# Patient Record
Sex: Female | Born: 1955 | Hispanic: No | Marital: Married | Smoking: Never smoker
Health system: Southern US, Community
[De-identification: ages and names within clinical notes are randomized; demographics above are authoritative.]

## PROBLEM LIST (undated history)

## (undated) DIAGNOSIS — E782 Mixed hyperlipidemia: Secondary | ICD-10-CM

## (undated) DIAGNOSIS — T148XXA Other injury of unspecified body region, initial encounter: Secondary | ICD-10-CM

## (undated) DIAGNOSIS — L80 Vitiligo: Secondary | ICD-10-CM

## (undated) DIAGNOSIS — R011 Cardiac murmur, unspecified: Secondary | ICD-10-CM

## (undated) DIAGNOSIS — E079 Disorder of thyroid, unspecified: Secondary | ICD-10-CM

## (undated) DIAGNOSIS — R7302 Impaired glucose tolerance (oral): Secondary | ICD-10-CM

## (undated) DIAGNOSIS — F419 Anxiety disorder, unspecified: Secondary | ICD-10-CM

## (undated) DIAGNOSIS — E039 Hypothyroidism, unspecified: Secondary | ICD-10-CM

## (undated) DIAGNOSIS — G4733 Obstructive sleep apnea (adult) (pediatric): Secondary | ICD-10-CM

## (undated) HISTORY — DX: Cardiac murmur, unspecified: R01.1

## (undated) HISTORY — DX: Anxiety disorder, unspecified: F41.9

## (undated) HISTORY — DX: Vitiligo: L80

## (undated) HISTORY — DX: Impaired glucose tolerance (oral): R73.02

## (undated) HISTORY — DX: Hypothyroidism, unspecified: E03.9

## (undated) HISTORY — DX: Mixed hyperlipidemia: E78.2

## (undated) HISTORY — PX: BREAST BIOPSY: SHX20

## (undated) HISTORY — PX: ABDOMINAL SURGERY: SHX537

## (undated) HISTORY — DX: Obstructive sleep apnea (adult) (pediatric): G47.33

## (undated) HISTORY — DX: Other injury of unspecified body region, initial encounter: T14.8XXA

---

## 2004-08-15 ENCOUNTER — Ambulatory Visit: Payer: Self-pay | Admitting: Unknown Physician Specialty

## 2005-09-01 ENCOUNTER — Ambulatory Visit: Payer: Self-pay | Admitting: Unknown Physician Specialty

## 2006-10-10 ENCOUNTER — Ambulatory Visit: Payer: Self-pay | Admitting: Unknown Physician Specialty

## 2007-10-31 ENCOUNTER — Ambulatory Visit: Payer: Self-pay | Admitting: Unknown Physician Specialty

## 2008-03-04 ENCOUNTER — Ambulatory Visit: Payer: Self-pay | Admitting: Unknown Physician Specialty

## 2008-03-31 ENCOUNTER — Ambulatory Visit: Payer: Self-pay | Admitting: Internal Medicine

## 2008-04-08 ENCOUNTER — Ambulatory Visit: Payer: Self-pay | Admitting: Internal Medicine

## 2008-11-11 ENCOUNTER — Ambulatory Visit: Payer: Self-pay | Admitting: Unknown Physician Specialty

## 2009-04-05 ENCOUNTER — Ambulatory Visit: Payer: Self-pay | Admitting: Pain Medicine

## 2009-04-14 ENCOUNTER — Ambulatory Visit: Payer: Self-pay | Admitting: Pain Medicine

## 2009-05-06 ENCOUNTER — Ambulatory Visit: Payer: Self-pay | Admitting: Pain Medicine

## 2009-05-12 ENCOUNTER — Ambulatory Visit: Payer: Self-pay | Admitting: Pain Medicine

## 2009-06-10 ENCOUNTER — Ambulatory Visit: Payer: Self-pay | Admitting: Pain Medicine

## 2009-07-06 ENCOUNTER — Ambulatory Visit: Payer: Self-pay | Admitting: Pain Medicine

## 2009-09-21 ENCOUNTER — Ambulatory Visit: Payer: Self-pay | Admitting: Pain Medicine

## 2009-11-09 ENCOUNTER — Ambulatory Visit: Payer: Self-pay | Admitting: Pain Medicine

## 2009-11-10 ENCOUNTER — Ambulatory Visit: Payer: Self-pay | Admitting: Pain Medicine

## 2009-11-23 ENCOUNTER — Encounter: Admission: RE | Admit: 2009-11-23 | Discharge: 2009-11-23 | Payer: Self-pay | Admitting: Internal Medicine

## 2009-12-16 ENCOUNTER — Ambulatory Visit: Payer: Self-pay | Admitting: Pain Medicine

## 2010-01-18 ENCOUNTER — Ambulatory Visit: Payer: Self-pay | Admitting: Pain Medicine

## 2010-03-17 ENCOUNTER — Ambulatory Visit: Payer: Self-pay | Admitting: Pain Medicine

## 2010-03-17 ENCOUNTER — Ambulatory Visit: Payer: Self-pay | Admitting: Unknown Physician Specialty

## 2010-05-20 ENCOUNTER — Ambulatory Visit: Payer: Self-pay | Admitting: Unknown Physician Specialty

## 2010-06-15 ENCOUNTER — Ambulatory Visit: Payer: Self-pay | Admitting: Pain Medicine

## 2010-09-01 ENCOUNTER — Ambulatory Visit: Payer: Self-pay | Admitting: Pain Medicine

## 2010-10-04 ENCOUNTER — Ambulatory Visit: Payer: Self-pay | Admitting: Pain Medicine

## 2010-12-08 ENCOUNTER — Ambulatory Visit: Payer: Self-pay | Admitting: Pain Medicine

## 2011-03-02 ENCOUNTER — Ambulatory Visit: Payer: Self-pay | Admitting: Pain Medicine

## 2011-05-25 ENCOUNTER — Ambulatory Visit: Payer: Self-pay | Admitting: Pain Medicine

## 2011-06-07 ENCOUNTER — Ambulatory Visit: Payer: Self-pay | Admitting: Pain Medicine

## 2011-07-11 ENCOUNTER — Ambulatory Visit: Payer: Self-pay | Admitting: Pain Medicine

## 2011-07-12 ENCOUNTER — Ambulatory Visit: Payer: Self-pay | Admitting: Pain Medicine

## 2011-08-17 ENCOUNTER — Ambulatory Visit: Payer: Self-pay | Admitting: Pain Medicine

## 2011-12-16 ENCOUNTER — Encounter (HOSPITAL_COMMUNITY): Payer: Self-pay | Admitting: Emergency Medicine

## 2011-12-16 ENCOUNTER — Emergency Department (INDEPENDENT_AMBULATORY_CARE_PROVIDER_SITE_OTHER)

## 2011-12-16 ENCOUNTER — Emergency Department (INDEPENDENT_AMBULATORY_CARE_PROVIDER_SITE_OTHER)
Admission: EM | Admit: 2011-12-16 | Discharge: 2011-12-16 | Disposition: A | Discharge status: Home / Self Care | Source: Home / Self Care | Attending: Emergency Medicine | Admitting: Emergency Medicine

## 2011-12-16 DIAGNOSIS — IMO0002 Reserved for concepts with insufficient information to code with codable children: Secondary | ICD-10-CM

## 2011-12-16 DIAGNOSIS — S93409A Sprain of unspecified ligament of unspecified ankle, initial encounter: Secondary | ICD-10-CM

## 2011-12-16 DIAGNOSIS — M549 Dorsalgia, unspecified: Secondary | ICD-10-CM

## 2011-12-16 DIAGNOSIS — S73109A Unspecified sprain of unspecified hip, initial encounter: Secondary | ICD-10-CM

## 2011-12-16 HISTORY — DX: Disorder of thyroid, unspecified: E07.9

## 2011-12-16 MED ORDER — HYDROCODONE-IBUPROFEN 7.5-200 MG PO TABS: 1.0000 | ORAL_TABLET | Freq: Three times a day (TID) | ORAL | Status: AC | PRN

## 2011-12-16 NOTE — ED Provider Notes (Signed)
History     CSN: [id number]  Arrival date & time 12/16/11  1623   First MD Initiated Contact with Patient 12/16/11 1639      Chief Complaint  Patient presents with  . Ankle Injury    (Consider location/radiation/quality/duration/timing/severity/associated sxs/prior treatment) HPI Comments: Patient presents urgent care Pt c/o left ankle inj today around 15:05... 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 States she twisted her ankle... Sx include: tender, swelling, limping... she describes it sometimes ago she had a left ankle injury as well and is wondering, if she reinjured her ankle. During the fall she fell on her back and is also experiencing lower back pain. Patient denies any numbness, tingling sensation weakness or lower extremities. It hurts when she walks on her left ankle and also on her back.  Patient is a 56 y.o. female presenting with lower extremity injury. The history is provided by the patient.  Ankle Injury This is a new problem. The problem occurs constantly. The problem has not changed since onset.The symptoms are aggravated by bending and walking. She has tried nothing for the symptoms. The treatment provided no relief.    Past Medical History  Diagnosis Date  . Thyroid disease     History reviewed. No pertinent past surgical history.  No family history on file.  History  Substance Use Topics  . Smoking status: Never Smoker   . Smokeless tobacco: Not on file  . Alcohol Use: Yes    OB History    Grav Para Term Preterm Abortions TAB SAB Ect Mult Living                  Review of Systems  Constitutional: Positive for activity change. Negative for fever and fatigue.  Musculoskeletal: Positive for back pain and joint swelling. Negative for myalgias.  Neurological: Negative for weakness and numbness.    Allergies  Review of patient's allergies indicates no known  allergies.  Home Medications   Current Outpatient Rx  Name Route Sig Dispense Refill  . LEVOTHYROXINE SODIUM 88 MCG PO TABS Oral Take 88 mcg by mouth daily.    Marland Kitchen HYDROCODONE-IBUPROFEN 7.5-200 MG PO TABS Oral Take 1 tablet by mouth every 8 (eight) hours as needed for pain. 15 tablet 0    BP 147/85  Pulse 75  Temp 98.4 F (36.9 C) (Oral)  Resp 17  SpO2 98%  Physical Exam  Nursing note and vitals reviewed. Constitutional: She appears well-developed and well-nourished.  Musculoskeletal: She exhibits tenderness.       Left ankle: She exhibits normal range of motion, no swelling, no ecchymosis, no laceration and normal pulse. tenderness. Lateral malleolus tenderness found. No posterior TFL, no head of 5th metatarsal and no proximal fibula tenderness found. Achilles tendon normal.       Back:       Feet:  Neurological: She is alert.  Skin: No rash noted. No erythema. No pallor.    ED Course  Procedures (including critical care time)  Labs Reviewed - No data to display Dg Ankle Complete Left  12/16/2011  *RADIOLOGY REPORT*  Clinical Data: 56 year old female with left ankle pain following injury.  LEFT ANKLE COMPLETE - 3+ VIEW  Comparison: 11/23/2009  Findings: There is no evidence of acute fracture, subluxation or dislocation. The ankle mortise is intact. No focal bony lesions are identified. There is no evidence of soft tissue swelling.  IMPRESSION: No evidence of  acute bony abnormality.   Original Report Authenticated By: Rosendo Gros, M.D.      1. Ankle sprain and strain   2. Sprain hip/thigh   3. Back pain       MDM  Full with an associated left ankle sprain, back contusion. Have prescribed patient a course of Vicoprofen for 5 days encourage her to followup with occupational health if pain with weightbearing activities will be on Monday for further job restrictions and stipulations.      Jimmie Molly, MD 12/16/11 1902

## 2011-12-16 NOTE — ED Notes (Signed)
Pt c/o left ankle inj today around 15:05... 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 States she twisted her ankle... Sx include: tender, swelling, limping... Denies: head inj and loss of conscious... Pt is alert w/no signs of distress.

## 2012-07-25 ENCOUNTER — Other Ambulatory Visit: Payer: Self-pay | Admitting: Pain Medicine

## 2012-07-25 ENCOUNTER — Ambulatory Visit: Payer: Self-pay | Admitting: Pain Medicine

## 2012-08-21 ENCOUNTER — Ambulatory Visit: Payer: Self-pay | Admitting: Pain Medicine

## 2012-09-18 ENCOUNTER — Ambulatory Visit: Payer: Self-pay | Admitting: Pain Medicine

## 2012-10-17 ENCOUNTER — Ambulatory Visit: Payer: Self-pay | Admitting: Pain Medicine

## 2012-12-05 ENCOUNTER — Ambulatory Visit: Payer: Self-pay | Admitting: Pain Medicine

## 2012-12-09 ENCOUNTER — Ambulatory Visit: Payer: Self-pay | Admitting: Pain Medicine

## 2013-01-07 ENCOUNTER — Ambulatory Visit: Payer: Self-pay | Admitting: Pain Medicine

## 2013-02-06 ENCOUNTER — Ambulatory Visit: Payer: Self-pay | Admitting: Pain Medicine

## 2013-03-13 ENCOUNTER — Ambulatory Visit: Payer: Self-pay | Admitting: Pain Medicine

## 2013-04-15 ENCOUNTER — Ambulatory Visit: Payer: Self-pay | Admitting: Pain Medicine

## 2013-05-13 ENCOUNTER — Ambulatory Visit: Payer: Self-pay | Admitting: Pain Medicine

## 2013-06-12 ENCOUNTER — Ambulatory Visit: Payer: Self-pay | Admitting: Pain Medicine

## 2013-07-10 ENCOUNTER — Ambulatory Visit: Payer: Self-pay | Admitting: Pain Medicine

## 2013-08-07 ENCOUNTER — Ambulatory Visit: Payer: Self-pay | Admitting: Pain Medicine

## 2013-09-09 ENCOUNTER — Ambulatory Visit: Payer: Self-pay | Admitting: Pain Medicine

## 2013-10-09 ENCOUNTER — Ambulatory Visit: Payer: Self-pay | Admitting: Pain Medicine

## 2013-11-18 ENCOUNTER — Ambulatory Visit: Payer: Self-pay | Admitting: Pain Medicine

## 2013-12-16 ENCOUNTER — Ambulatory Visit: Payer: Self-pay | Admitting: Pain Medicine

## 2014-01-13 ENCOUNTER — Ambulatory Visit: Payer: Self-pay | Admitting: Pain Medicine

## 2014-02-03 ENCOUNTER — Emergency Department: Payer: Self-pay | Admitting: Emergency Medicine

## 2014-02-17 ENCOUNTER — Ambulatory Visit: Payer: Self-pay | Admitting: Pain Medicine

## 2014-03-19 ENCOUNTER — Ambulatory Visit: Payer: Self-pay | Admitting: Pain Medicine

## 2014-04-16 ENCOUNTER — Ambulatory Visit: Payer: Self-pay | Admitting: Pain Medicine

## 2014-05-12 ENCOUNTER — Ambulatory Visit: Payer: Self-pay | Admitting: Pain Medicine

## 2014-06-16 ENCOUNTER — Ambulatory Visit
Admit: 2014-06-16 | Disposition: A | Discharge status: Home / Self Care | Payer: Self-pay | Attending: Pain Medicine | Admitting: Pain Medicine

## 2014-07-16 ENCOUNTER — Encounter: Payer: Self-pay | Admitting: Pain Medicine

## 2014-07-16 ENCOUNTER — Encounter (INDEPENDENT_AMBULATORY_CARE_PROVIDER_SITE_OTHER): Payer: Self-pay

## 2014-07-16 ENCOUNTER — Ambulatory Visit: Payer: 59 | Attending: Pain Medicine | Admitting: Pain Medicine

## 2014-07-16 VITALS — BP 136/91 | HR 63 | Temp 98.3°F | Resp 16 | Ht 63.0 in | Wt 175.0 lb

## 2014-07-16 DIAGNOSIS — M47816 Spondylosis without myelopathy or radiculopathy, lumbar region: Secondary | ICD-10-CM | POA: Diagnosis not present

## 2014-07-16 DIAGNOSIS — M542 Cervicalgia: Secondary | ICD-10-CM | POA: Diagnosis present

## 2014-07-16 DIAGNOSIS — S82891A Other fracture of right lower leg, initial encounter for closed fracture: Secondary | ICD-10-CM | POA: Insufficient documentation

## 2014-07-16 DIAGNOSIS — M5136 Other intervertebral disc degeneration, lumbar region: Secondary | ICD-10-CM | POA: Diagnosis not present

## 2014-07-16 DIAGNOSIS — M546 Pain in thoracic spine: Secondary | ICD-10-CM | POA: Diagnosis present

## 2014-07-16 DIAGNOSIS — G5600 Carpal tunnel syndrome, unspecified upper limb: Secondary | ICD-10-CM | POA: Insufficient documentation

## 2014-07-16 DIAGNOSIS — M533 Sacrococcygeal disorders, not elsewhere classified: Secondary | ICD-10-CM | POA: Diagnosis not present

## 2014-07-16 DIAGNOSIS — M7700 Medial epicondylitis, unspecified elbow: Secondary | ICD-10-CM | POA: Diagnosis not present

## 2014-07-16 DIAGNOSIS — G8929 Other chronic pain: Secondary | ICD-10-CM | POA: Insufficient documentation

## 2014-07-16 DIAGNOSIS — M51369 Other intervertebral disc degeneration, lumbar region without mention of lumbar back pain or lower extremity pain: Secondary | ICD-10-CM | POA: Insufficient documentation

## 2014-07-16 DIAGNOSIS — M5481 Occipital neuralgia: Secondary | ICD-10-CM | POA: Insufficient documentation

## 2014-07-16 DIAGNOSIS — G5603 Carpal tunnel syndrome, bilateral upper limbs: Secondary | ICD-10-CM

## 2014-07-16 MED ORDER — OXYCODONE HCL 10 MG PO TABS: ORAL_TABLET | ORAL | Status: DC

## 2014-07-16 NOTE — Progress Notes (Signed)
Subjective:    Patient ID: Shannon Crawford, female    DOB: 1955/11/14, 59 y.o.   MRN: [id number]  HPI  The  patient is 59 year old female who [facility] and treatment of pain involving the neck and entire back upper and lower extremity regions. The patient states that she has had some difficulty sleeping. We will continue medications and instruct patient to try taking one half of a Zanaflex with her Ambien at bedtime at this time and we will avoid interventional treatment. Patient will continue to exercise and attempt to reduce weight and will remain available to consider interventional treatment pending  Spinous to present treatment regimen. All understanding and in agreement with suggested treatment plan    Review of Systems     Objective:   Physical Exam  there was tenderness of the splenius capitate and occipitalis regions of mild degree. Palpation of the acromioclavicular and glenohumeral joint regions reproduced mild discomfort. There was unremarkable Spurling's maneuver. Tinel and Phalen's maneuver associated with mild discomfort.. There was tenderness to palpation of the epicondyles of the elbow medial and lateral aspects of the elbow. There was tenderness to palpationof the cervical and thoracic facet regions reproduced mild discomfort.No  crepitus of the thoracic region was noted patient over the lumbar paraspinal musculature region lumbar facet region was associated tends to palpation of moderate degree. Lateral bending and rotation reproduce moderate discomfort. Leg raising was tolerated to 30 without increase of pain with dorsiflexion noted. No sensory deficit of dermatomal distribution of the lower extremities noted. There was mild to moderate tenderness of the greater trochanteric region and iliotibial band region. There was decreased dorsiflexion of the with negative clonus and negative Homans. The abdomen was nontender with no  costovertebral angle tenderness noted.          Assessment & Plan:     degenerative disc disease lumbar spine  Prior MRI has revealed L4 vertebral body level degenerative changes with abnormal signal in the anterior aspect of the lower portion of the L1 vertebral body. Prior studies for follow-up evaluation of the abnormality have been without significant abnormalities to suspect underlying process causing this to appear on  MRI   Lumbar facet syndrome   Sacroiliac joint dysfunction   Epicondylitis of the elbow   Carpal tunnel syndrome     plan   Continue present medications. Patient will try adding one half of a Zanaflex  to 5 mg of Ambien at bedtime as needed for insomnia  F/U PCP for evaliation of  BP and general medical  condition.  F/U surgical evaluation.  F/U neurological evaluation.  May consider radiofrequency rhizolysis or intraspinal procedures pending response to present treatment and F/U evaluation.  [facility] to [facility] should patient have concerns prior to scheduled return appointment.

## 2014-07-16 NOTE — Progress Notes (Signed)
Safety precautions to be maintained throughout the outpatient stay will include: orient to surroundings, keep bed in low position, maintain call bell within reach at all times, provide assistance with transfer out of bed and ambulation.  

## 2014-07-16 NOTE — Progress Notes (Signed)
Discharged to home ambulatory with script for oxycodone at 1224

## 2014-07-16 NOTE — Patient Instructions (Addendum)
Continue present medications. As discussed you may try taken one half of a Zanaflex with one half of an Ambien to obtain better and more restful sleep. You may also try taken one half of a Zanaflex during the day however remembers Zanaflex can cause excessive drowsiness confusion and other undesirable side effects. You may take one half to one Mobic per day if tolerated as well and continue oxycodone as discussed   F/U PCP for evaliation of  BP and general medical  condition.  F/U surgical evaluation.  F/U neurological evaluation.  May consider radiofrequency rhizolysis or intraspinal procedures pending response to present treatment and F/U evaluation.  [facility] to call [facility] should patient have concerns prior to scheduled return appointment.

## 2014-08-12 ENCOUNTER — Encounter: Payer: Self-pay | Admitting: Pain Medicine

## 2014-08-12 ENCOUNTER — Ambulatory Visit: Payer: 59 | Attending: Pain Medicine | Admitting: Pain Medicine

## 2014-08-12 VITALS — BP 116/50 | HR 66 | Temp 98.4°F | Resp 15 | Ht 63.0 in | Wt 175.0 lb

## 2014-08-12 DIAGNOSIS — M47816 Spondylosis without myelopathy or radiculopathy, lumbar region: Secondary | ICD-10-CM

## 2014-08-12 DIAGNOSIS — S82891A Other fracture of right lower leg, initial encounter for closed fracture: Secondary | ICD-10-CM

## 2014-08-12 DIAGNOSIS — M533 Sacrococcygeal disorders, not elsewhere classified: Secondary | ICD-10-CM | POA: Diagnosis not present

## 2014-08-12 DIAGNOSIS — M51369 Other intervertebral disc degeneration, lumbar region without mention of lumbar back pain or lower extremity pain: Secondary | ICD-10-CM

## 2014-08-12 DIAGNOSIS — G5600 Carpal tunnel syndrome, unspecified upper limb: Secondary | ICD-10-CM | POA: Diagnosis not present

## 2014-08-12 DIAGNOSIS — M5136 Other intervertebral disc degeneration, lumbar region: Secondary | ICD-10-CM | POA: Insufficient documentation

## 2014-08-12 DIAGNOSIS — M79605 Pain in left leg: Secondary | ICD-10-CM | POA: Diagnosis present

## 2014-08-12 DIAGNOSIS — M545 Low back pain: Secondary | ICD-10-CM | POA: Diagnosis present

## 2014-08-12 DIAGNOSIS — M79604 Pain in right leg: Secondary | ICD-10-CM | POA: Diagnosis present

## 2014-08-12 DIAGNOSIS — M5481 Occipital neuralgia: Secondary | ICD-10-CM

## 2014-08-12 MED ORDER — OXYCODONE HCL 10 MG PO TABS: ORAL_TABLET | ORAL | Status: DC

## 2014-08-12 NOTE — Progress Notes (Signed)
Discharged to home via ambulatory with script in hand for oxycodone.

## 2014-08-12 NOTE — Progress Notes (Signed)
Safety precautions to be maintained throughout the outpatient stay will include: orient to surroundings, keep bed in low position, maintain call bell within reach at all times, provide assistance with transfer out of bed and ambulation.  

## 2014-08-12 NOTE — Patient Instructions (Addendum)
Continue present medications and oxycodone  F/U PCP for evaliation of  BP and general medical  condition.  F/U surgical evaluation.  F/U neurological evaluation.  May consider radiofrequency rhizolysis or intraspinal procedures pending response to present treatment and F/U evaluation.  Patient to call [facility] should patient have concerns prior to scheduled return appointment.

## 2014-08-12 NOTE — Progress Notes (Signed)
Subjective:    Patient ID: Shannon Crawford, female    DOB: May 11, 1955, 59 y.o.   MRN: [id number]  HPI  The patient is a 59 year old female returns [facility] for further evaluation and treatment of pain involving the lower back and lower extremity region predominantly. Patient with history of pain involving the upper extremities with component of carpal tunnel syndrome and pain of the cervical and thoracic regions as well. Patient admits to some upper back pain beneath the scapula with patient described as a spasm which occurs on the right side predominantly beneath the scapula winging. Patient without trauma change in events of daily living the call significant change in symptomatology. We discussed interventional treatment, massage, and other treatment at the end of the present time we will continue present medications. The patient will continue to exercise and avoid activities which appear to aggravate condition and will call pain management should there be significant change in condition. We will continue present medications and consider modification of treatment regimen pending response to treatment and follow-up evaluation. Patient was understanding and agree with suggested treatment plan      Review of Systems     Objective:   Physical Exam  There was tenderness of the splenius capitis and occipitalis musculature region of mild degree. There was mild tenderness of the acromioclavicular and glenohumeral joint regions. Patient appeared to be with bilaterally equal grip strength. Tinel and Phalen's maneuver associated with mild discomfort. Palpation of the thoracic facet thoracic paraspinal musculature region was a tends to palpation on the right with severe tenderness over the region of the scapula and rhomboid musculature regions on the right. There was tenderness over the lower thoracic paraspinal musculature region as well and no crepitus of the thoracic region was noted.  Palpation over the lumbar paraspinal musculature region lumbar facet region was with mild tenderness to palpation. There was tenderness over the PSIS and PII S regions of mild to moderate degree. Straight leg raising tolerates approximately 30 with no increased pain with dorsiflexion noted.. There was decreased EHL strength of the right ankle compared to the left ankle status post fracture of ankle There was negative clonus negative Homans. Abdomen nontender and no costovertebral angle tenderness noted.      Assessment & Plan:  Degenerative disc disease lumbar spine Degenerative changes L4-L5 level most involved with the lower pole of the L4 vertebral body level of abnormality noted in the L4 vertebral body. Patient is status post further evaluation of abnormality of the L1 vertebral body without evidence of significant explained the abnormality. There is no evidence of malignancy  Lumbar facet syndrome  Sacroiliac joint dysfunction.  Status post fracture lower extremity(ankle)  Carpal tunnel syndrome    Plan   Continue present medications. Oxycodone and other medications  F/U PCP for evaliation of  BP and general medical  condition.  F/U surgical evaluation.  F/U neurological evaluation.  Patient was advised to massage the area of the thoracic region on stretching and other exercises with caution to avoid aggravation of symptomatology   May consider radiofrequency rhizolysis or intraspinal procedures pending response to present treatment and F/U evaluation.  [facility] to call [facility] should patient have concerns prior to scheduled return appointment.

## 2014-08-28 ENCOUNTER — Other Ambulatory Visit: Payer: Self-pay | Admitting: Pain Medicine

## 2014-09-09 ENCOUNTER — Ambulatory Visit: Payer: 59 | Attending: Pain Medicine | Admitting: Pain Medicine

## 2014-09-09 ENCOUNTER — Encounter: Payer: Self-pay | Admitting: Pain Medicine

## 2014-09-09 VITALS — BP 125/67 | HR 61 | Temp 98.2°F | Resp 15 | Ht 64.0 in | Wt 180.0 lb

## 2014-09-09 DIAGNOSIS — M47816 Spondylosis without myelopathy or radiculopathy, lumbar region: Secondary | ICD-10-CM

## 2014-09-09 DIAGNOSIS — M7060 Trochanteric bursitis, unspecified hip: Secondary | ICD-10-CM | POA: Insufficient documentation

## 2014-09-09 DIAGNOSIS — M545 Low back pain: Secondary | ICD-10-CM | POA: Diagnosis present

## 2014-09-09 DIAGNOSIS — Z8781 Personal history of (healed) traumatic fracture: Secondary | ICD-10-CM | POA: Diagnosis not present

## 2014-09-09 DIAGNOSIS — M5136 Other intervertebral disc degeneration, lumbar region: Secondary | ICD-10-CM | POA: Insufficient documentation

## 2014-09-09 DIAGNOSIS — M533 Sacrococcygeal disorders, not elsewhere classified: Secondary | ICD-10-CM | POA: Diagnosis not present

## 2014-09-09 DIAGNOSIS — G5600 Carpal tunnel syndrome, unspecified upper limb: Secondary | ICD-10-CM

## 2014-09-09 DIAGNOSIS — S82891A Other fracture of right lower leg, initial encounter for closed fracture: Secondary | ICD-10-CM

## 2014-09-09 DIAGNOSIS — M5481 Occipital neuralgia: Secondary | ICD-10-CM

## 2014-09-09 DIAGNOSIS — M79604 Pain in right leg: Secondary | ICD-10-CM | POA: Diagnosis present

## 2014-09-09 DIAGNOSIS — M79605 Pain in left leg: Secondary | ICD-10-CM | POA: Diagnosis present

## 2014-09-09 MED ORDER — OXYCODONE HCL 10 MG PO TABS: ORAL_TABLET | ORAL | Status: DC

## 2014-09-09 NOTE — Progress Notes (Signed)
Subjective:    Patient ID: Shannon Crawford, female    DOB: Jun 08, 1955, 59 y.o.   MRN: [id number]  HPI  Patient is 59 year old female [facility] for further evaluation and treatment of pain involving the lower back lower extremity regions with pain of the upper extremity regions as well. Patient states that she is exercising again and that she appears to be with pain fairly well-controlled at this time. Patient states that her carpal tunnel symptoms appear to be fairly well-controlled and that the lower back lower extremity pain has improved with prior treatment and [facility] as well as continuing exercise program. Patient also admits to sleeping better. We will continue present medications at this time and avoid interventional treatment the patient was understanding and in agreement with suggested treatment plan.     Review of Systems     Objective:   Physical Exam   There was tenderness to palpation over the splenius capitis and occipitalis musculature region of mild degree. To be unremarkable Spurling's maneuver. Palpation over the cervical facet cervical paraspinal musculature region and thoracic facet thoracic paraspinal musculature region reproduced mild discomfort. Tinel and Phalen's maneuver associated with mild discomfort. Patient appeared to be with slightly decreased grip strength. Palpation over the lower thoracic paraspinal musculature region was with evidence of mild muscle spasms. Palpation over the lumbar paraspinal muscles region lumbar facet region associated with mild discomfort. Lateral bending rotation and extension and palpation of the lumbar facets reproduce mild discomfort. Palpation of the greater trochanteric region iliotibial band region was without increased pain of significant degree. There was moderate tends to palpation of the greater trochanteric region on the left as well as on the right. There was slight increase of pain with  pressure prior to the ilium with patient in lateral decubitus position. Patrick's maneuver was unremarkable. Straight leg raising was without increase of pain with dorsiflexion and tolerates approximately 30. There was negative clonus negative Homans. Abdomen nontender with no costovertebral angle tenderness noted.       Assessment & Plan:  Degenerative disc disease lumbar spine Lumbar facet syndrome  Sacroiliac joint dysfunction  Carpal tunnel syndrome  Greater trochanteric bursitis  Status post lower extremity fracture ( ankle )     Plan  Continue present medications Zanaflex  Ambien oxycodone and Lidoderm patches  F/U PCP Dr. Valrie Hart  for evaliation of  BP and general medical  condition.  F/U surgical evaluation  F/U neurological evaluation  F/U Dr Garnetta Buddy   May consider radiofrequency rhizolysis or intraspinal procedures pending response to present treatment and F/U evaluation.  [facility] to call [facility] should patient have concerns prior to scheduled return appointment.

## 2014-09-09 NOTE — Progress Notes (Signed)
Safety precautions to be maintained throughout the outpatient stay will include: orient to surroundings, keep bed in low position, maintain call bell within reach at all times, provide assistance with transfer out of bed and ambulation.  

## 2014-09-09 NOTE — Patient Instructions (Addendum)
Continue present medications Zanaflex, oxycodone, Lidoderm patches and other medications . As discussed we may consider Flector patch and Lidoderm patch is not effective  F/U PCPDr.N Humphrey Rolls for evaliation of  BP and general medical  condition.  F/U surgical evaluation  F/U Dr  Gretel Acre  F/U neurological evaluation  May consider radiofrequency rhizolysis or intraspinal procedures pending response to present treatment and F/U evaluation.  Patient to call [facility] should patient have concerns prior to scheduled return appointment.

## 2014-09-10 ENCOUNTER — Encounter: Payer: Self-pay | Admitting: Pain Medicine

## 2014-10-01 ENCOUNTER — Encounter: Payer: Self-pay | Admitting: Psychiatry

## 2014-10-01 ENCOUNTER — Ambulatory Visit (INDEPENDENT_AMBULATORY_CARE_PROVIDER_SITE_OTHER): Payer: 59 | Admitting: Psychiatry

## 2014-10-01 DIAGNOSIS — F331 Major depressive disorder, recurrent, moderate: Secondary | ICD-10-CM | POA: Diagnosis not present

## 2014-10-01 MED ORDER — DULOXETINE HCL 60 MG PO CPEP: 60.0000 mg | ORAL_CAPSULE | Freq: Every day | ORAL | Status: DC

## 2014-10-01 MED ORDER — ZOLPIDEM TARTRATE 5 MG PO TABS: 5.0000 mg | ORAL_TABLET | Freq: Every evening | ORAL | Status: DC | PRN

## 2014-10-01 MED ORDER — ALPRAZOLAM 0.5 MG PO TABS: 0.5000 mg | ORAL_TABLET | Freq: Every day | ORAL | Status: DC | PRN

## 2014-10-01 NOTE — Progress Notes (Signed)
[facility] MD/PA/NP OP Progress Note  10/01/2014 1:14 PM Shannon Crawford  MRN:  [id number]  Subjective:   Pt  is a 59 year old female who presented for the follow-up appointment. She reported that she has been trying to learn how to knit on her own. She brought some things to show to me. She's been working hard on the same. Patient reported that she is currently feeling stable on her current medications. She currently denied having any suicidal ideations or plans. She sleeps well with the help of zolpidem. She takes Xanax on a when necessary basis. She reported that she has good relationship with her husband. She denied having any mood symptoms. She does not smoke or use any drugs or alcohol at this time. She appeared pleasant and cooperative. Chief Complaint:  Chief Complaint    Follow-up; Depression; Anxiety     Visit Diagnosis:     ICD-9-CM ICD-10-CM   1. MDD (major depressive disorder), recurrent episode, moderate 296.32 F33.1     Past Medical History:  Past Medical History  Diagnosis Date  . Thyroid disease   . Fracture     ankle  . Heart murmur   . Hypothyroid   . Anxiety     Past Surgical History  Procedure Laterality Date  . Abdominal surgery      tummy tuck   Family History:  Family History  Problem Relation Age of Onset  . Hypertension Mother   . Alcohol abuse Father    Social History:  Social History   Social History  . Marital Status: Married    Spouse Name: N/A  . Number of Children: N/A  . Years of Education: N/A   Social History Main Topics  . Smoking status: Never Smoker   . Smokeless tobacco: None  . Alcohol Use: Yes  . Drug Use: No  . Sexual Activity: Yes   Other Topics Concern  . None   Social History Narrative   Additional History: Married and lives with husband.   Assessment:   Musculoskeletal: Strength & Muscle Tone: within normal limits Gait & Station: normal Patient leans: N/A  Psychiatric Specialty Exam: HPI  Review of Systems   Musculoskeletal: Positive for back pain.  Psychiatric/Behavioral: Positive for depression. The patient is nervous/anxious.   All other systems reviewed and are negative.   There were no vitals taken for this visit.There is no weight on file to calculate BMI.  General Appearance: Casual  Eye Contact:  Fair  Speech:  Clear and Coherent and Normal Rate  Volume:  Normal  Mood:  Euthymic  Affect:  Congruent  Thought Process:  Goal Directed  Orientation:  Full (Time, Place, and Person)  Thought Content:  WDL  Suicidal Thoughts:  No  Homicidal Thoughts:  No  Memory:  Immediate;   Fair  Judgement:  Fair  Insight:  Fair  Psychomotor Activity:  Normal  Concentration:  Fair  Recall:  [employer] of Knowledge: Fair  Language: Fair  Akathisia:  No  Handed:  Right  AIMS (if indicated):    Assets:  Communication Skills Desire for Improvement Physical Health Social Support  ADL's:  Intact  Cognition: WNL  Sleep:     Is the patient at risk to self?  No. Has the patient been a risk to self in the past 6 months?  No. Has the patient been a risk to self within the distant past?  No. Is the patient a risk to others?  No. Has the patient been a  risk to others in the past 6 months?  No. Has the patient been a risk to others within the distant past?  No.  Current Medications: Current Outpatient Prescriptions  Medication Sig Dispense Refill  . ALPRAZolam (XANAX) 0.5 MG tablet Take 0.5 mg by mouth daily as needed for anxiety.    . DULoxetine (CYMBALTA) 30 MG capsule Take 30 mg by mouth daily.    Marland Kitchen levothyroxine (SYNTHROID, LEVOTHROID) 88 MCG tablet Take 88 mcg by mouth daily.    Marland Kitchen lidocaine (LIDODERM) 5 % Place 3 patches onto the skin as needed. 1-3 patches to painful areas. Remove & Discard patch within 12 hours or as directed by MD    . meloxicam (MOBIC) 7.5 MG tablet Take 7.5 mg by mouth as needed.     . Oxycodone HCl 10 MG TABS Limit 1/2-1 tablet bid-qid if tolerated 120 tablet 0  .  Pitavastatin Calcium 2 MG TABS Take 2 mg by mouth daily.    Marland Kitchen zolpidem (AMBIEN) 5 MG tablet Take 5 mg by mouth at bedtime as needed for sleep.    . DULoxetine (CYMBALTA) 60 MG capsule     . orphenadrine (NORFLEX) 100 MG tablet Take 100 mg by mouth as needed. 1 tab daily or bid if tolerated    . tizanidine (ZANAFLEX) 2 MG capsule Take 2 mg by mouth as needed. 1-2 at bedtime     No current facility-administered medications for this visit.    Medical Decision Making:  Review of Psycho-Social Stressors (1) and Review and summation of old records (2)  Treatment Plan Summary:Medication management   Continue Cymbalta 60 mg by mouth daily she also takes Xanax 0.5 mg and a when necessary basis. Patient sleeps well with the help of zolpidem 5 mg by mouth daily at bedtime medications refilled for the next 3 months. Follow up in 3 months.   More than 50% of the time spent in psychoeducation, counseling and coordination of care.    This note was generated in part or whole with voice recognition software. Voice regonition is usually quite accurate but there are transcription errors that can and very often do occur. I apologize for any typographical errors that were not detected and corrected.    Rainey Pines 10/01/2014, 1:14 PM

## 2014-10-13 ENCOUNTER — Encounter: Payer: Self-pay | Admitting: Pain Medicine

## 2014-10-13 ENCOUNTER — Ambulatory Visit: Payer: 59 | Attending: Pain Medicine | Admitting: Pain Medicine

## 2014-10-13 VITALS — BP 129/73 | HR 69 | Temp 98.2°F | Resp 18 | Ht 64.0 in | Wt 180.0 lb

## 2014-10-13 DIAGNOSIS — M7060 Trochanteric bursitis, unspecified hip: Secondary | ICD-10-CM | POA: Diagnosis not present

## 2014-10-13 DIAGNOSIS — M79601 Pain in right arm: Secondary | ICD-10-CM | POA: Diagnosis present

## 2014-10-13 DIAGNOSIS — G5600 Carpal tunnel syndrome, unspecified upper limb: Secondary | ICD-10-CM | POA: Insufficient documentation

## 2014-10-13 DIAGNOSIS — M5136 Other intervertebral disc degeneration, lumbar region: Secondary | ICD-10-CM | POA: Insufficient documentation

## 2014-10-13 DIAGNOSIS — M533 Sacrococcygeal disorders, not elsewhere classified: Secondary | ICD-10-CM | POA: Diagnosis not present

## 2014-10-13 DIAGNOSIS — M47816 Spondylosis without myelopathy or radiculopathy, lumbar region: Secondary | ICD-10-CM

## 2014-10-13 DIAGNOSIS — S82891A Other fracture of right lower leg, initial encounter for closed fracture: Secondary | ICD-10-CM

## 2014-10-13 DIAGNOSIS — M545 Low back pain: Secondary | ICD-10-CM | POA: Diagnosis present

## 2014-10-13 DIAGNOSIS — M79602 Pain in left arm: Secondary | ICD-10-CM | POA: Diagnosis present

## 2014-10-13 DIAGNOSIS — Z8781 Personal history of (healed) traumatic fracture: Secondary | ICD-10-CM | POA: Diagnosis not present

## 2014-10-13 DIAGNOSIS — M5481 Occipital neuralgia: Secondary | ICD-10-CM

## 2014-10-13 MED ORDER — OXYCODONE HCL 10 MG PO TABS: ORAL_TABLET | ORAL | Status: DC

## 2014-10-13 NOTE — Progress Notes (Signed)
Safety precautions to be maintained throughout the outpatient stay will include: orient to surroundings, keep bed in low position, maintain call bell within reach at all times, provide assistance with transfer out of bed and ambulation.  

## 2014-10-13 NOTE — Patient Instructions (Signed)
Continue present medication oxycodone  F/U PCP  N khan  for evaliation of  BP and general medical  condition  F/U surgical evaluation  F/U neurological evaluation  May consider radiofrequency rhizolysis or intraspinal procedures pending response to present treatment and F/U evaluation   [facility] to call [facility] should patient have concerns prior to scheduled return appointmen.

## 2014-10-13 NOTE — Progress Notes (Signed)
Subjective:    Patient ID: Shannon Crawford, female    DOB: 05-Feb-1956, 59 y.o.   MRN: [id number]  HPI  Patient is a 59 year old female who [facility] further evaluation and treatment of pain involving the upper extremities lower back and lower extremity regions. At the present time patient appears to be with pain fairly well-controlled. Patient is able to perform most activities of daily living without significant pain interferes with activities of daily living. We discussed patient's condition patient did admit to some increased pain involving the greater trochanteric region which is interfered with some ability to obtain restful sleep. We advised patient to use Lidoderm patch and to apply Voltaren gel to the hip. We will remain available to consider interventional treatment as well as additional modification of treatment should patient's symptoms increase or persist. The patient was understanding and in agreement status treatment plan We will continue present medications as prescribed at this time. The patient was in agreement with suggested treatment plan    Review of Systems     Objective:   Physical Exam  There was tenderness of the splenius capitis and occipitalis musculature region of mild degree. There was mild tennis over the cervical facet cervical paraspinal musculature region palpation of the acromioclavicular and glenohumeral joint region was with mild tends to palpation. There was mild to moderate increase of pain with Tinel and Phalen's maneuver. Patient was with question decreased grip strength. There was unremarkable Spurling's maneuver. Palpation over the thoracic facet thoracic paraspinal muscles region was with mild tends to palpation and no crepitus of the thoracic region was noted. Palpation over the lumbar paraspinal muscle lumbar facet region was associated increased pain of moderate degree lateral bending and rotation associated with moderate  discomfort there was moderate tends of the PSIS and PII S region as well as the gluteal and piriformis musculature region. There was moderate to moderately severe tends to palpation of the greater trochanteric region . Straight leg raising was tolerates approximately 30 without an increase of pain with dorsiflexion noted. There was negative clonus negative Homans. Abdomen was nontender and no costovertebral tenderness noted.      Assessment & Plan:    Degenerative disc disease lumbar spine  Lumbar facet syndrome  Greater trochanteric bursitis  Sacroiliac joint dysfunction  Carpal tunnel syndrome  Greater trochanteric bursitis  Status post lower extremity fracture ( ankle )    Plan   Continue present medication oxycodone Mpbic Norflex  Lidoderm patch and Voltaren gel  F/U PCP  Dr.N khan  for evaliation of  BP and general medical  condition  F/U surgical evaluation as needed  F/U neurological evaluation  F/U  Dr.Faheem for psych evaluation  May consider radiofrequency rhizolysis or intraspinal procedures pending response to present treatment and F/U evaluation   Patient to call [facility] should patient have concerns prior to scheduled return appointmen.

## 2014-10-19 NOTE — Progress Notes (Signed)
This has been reorder, not discontinued.  

## 2014-11-09 ENCOUNTER — Encounter: Payer: Self-pay | Admitting: Pain Medicine

## 2014-11-09 ENCOUNTER — Ambulatory Visit: Payer: 59 | Attending: Pain Medicine | Admitting: Pain Medicine

## 2014-11-09 VITALS — BP 121/69 | HR 66 | Temp 98.0°F | Resp 16 | Ht 64.0 in | Wt 175.0 lb

## 2014-11-09 DIAGNOSIS — M47816 Spondylosis without myelopathy or radiculopathy, lumbar region: Secondary | ICD-10-CM

## 2014-11-09 DIAGNOSIS — G5600 Carpal tunnel syndrome, unspecified upper limb: Secondary | ICD-10-CM | POA: Diagnosis not present

## 2014-11-09 DIAGNOSIS — M7060 Trochanteric bursitis, unspecified hip: Secondary | ICD-10-CM | POA: Insufficient documentation

## 2014-11-09 DIAGNOSIS — M5481 Occipital neuralgia: Secondary | ICD-10-CM

## 2014-11-09 DIAGNOSIS — S82891A Other fracture of right lower leg, initial encounter for closed fracture: Secondary | ICD-10-CM

## 2014-11-09 DIAGNOSIS — M5136 Other intervertebral disc degeneration, lumbar region: Secondary | ICD-10-CM | POA: Diagnosis not present

## 2014-11-09 DIAGNOSIS — Z8781 Personal history of (healed) traumatic fracture: Secondary | ICD-10-CM | POA: Insufficient documentation

## 2014-11-09 DIAGNOSIS — M533 Sacrococcygeal disorders, not elsewhere classified: Secondary | ICD-10-CM | POA: Diagnosis not present

## 2014-11-09 DIAGNOSIS — M545 Low back pain: Secondary | ICD-10-CM | POA: Diagnosis present

## 2014-11-09 DIAGNOSIS — M51369 Other intervertebral disc degeneration, lumbar region without mention of lumbar back pain or lower extremity pain: Secondary | ICD-10-CM

## 2014-11-09 DIAGNOSIS — G5603 Carpal tunnel syndrome, bilateral upper limbs: Secondary | ICD-10-CM

## 2014-11-09 MED ORDER — OXYCODONE HCL 10 MG PO TABS: ORAL_TABLET | ORAL | Status: DC

## 2014-11-09 NOTE — Patient Instructions (Signed)
PLAN   Continue present medication Zanaflex and oxycodone  F/U PCP Dr.N Humphrey Rolls  for evaliation of  BP and general medical  condition  F/U surgical evaluation. May consider pending follow-up evaluations  F/U neurological evaluation. May consider pending follow-up evaluations  May consider radiofrequency rhizolysis or intraspinal procedures pending response to present treatment and F/U evaluation   Patient to call [facility] should patient have concerns prior to scheduled return appointment.

## 2014-11-09 NOTE — Progress Notes (Signed)
Subjective:    Patient ID: Shannon Crawford, female    DOB: 1956/01/28, 59 y.o.   MRN: [id number]  HPI   patient is 59 year old female returns to pain management for further evaluation and treatment of pain involving the lower back lower extremity region with pain occurring the neck and upper extremity regions of lesser degree. Patient states present time the pain is fairly well-controlled present medications denies any trauma change in events of daily living the call significant change in symptomatology. We discussed patient's condition and will continue present medications and will consider modification of treatment should there be change in patient's condition. Patient admits to occasional increase of spasms and states that recently she had some increased tightness of the lower back region. New her exercise routine and will call pain management should she wish to proceed with interventional treatment wish to consider modifications of treatment regimen. We will continue present medications as discussed including oxycodone and Zanaflex and will consider modification of treatment pending follow-up evaluation. The patient agrees with suggested treatment plan     Review of Systems     Objective:   Physical Exam  there was tenderness over splenius capitis occipitalis musculature region of mild degree. There was mild tinnitus of the acromioclavicular and glenohumeral joint region. Patient appeared to be with bilaterally equal grip strength Tinel and Phalen's maneuver without increase of pain of significant degree. There appear to be unremarkable Spurling's maneuver. Palpation over the cervical facet cervical paraspinal musculature region and  The thoracic facet thoracic paraspinal musculature region reproduced mild to moderate discomfort. No crepitus of the thoracic region was noted. Palpation over the lumbar paraspinal muscles lumbar facet region associated with moderate discomfort. Lateral bending  rotation and extension and palpation of the lumbar facets reproduce moderate discomfort. Straight leg raising was tolerated to 30 without increase of pain with dorsiflexion noted. There appeared to be negative clonus negative Homans EHL strength appeared to be slightly decreased on the left compared to the right. There was negative Homans with decreased EHL strength on the left compared to the right. There was tenderness over the PSIS and PII S region as well as the gluteal and piriformis musculature region of moderate degree. There wasmild to moderate tenderness over the greater trochanteric region iliotibial band region. Abdomen was nontender with no costovertebral angle tenderness noted.         Assessment & Plan:    Degenerative disc disease lumbar spine  Lumbar facet syndrome  Greater trochanteric bursitis  Sacroiliac joint dysfunction  Carpal tunnel syndrome  Greater trochanteric bursitis  Status post lower extremity fracture ( ankle )     PLAN   Continue present medication Zanaflex and oxycodone  F/U PCP  Dr.N Welton Flakes  for evaliation of  BP and general medical  condition  F/U surgical evaluation. May consider pending follow-up evaluations  F/U neurological evaluation. May consider pending follow-up evaluations  May consider radiofrequency rhizolysis or intraspinal procedures pending response to present treatment and F/U evaluation   Patient to call [facility] should patient have concerns prior to scheduled return appointment.

## 2014-11-11 ENCOUNTER — Encounter: Payer: Self-pay | Admitting: Pain Medicine

## 2014-11-17 ENCOUNTER — Other Ambulatory Visit: Payer: Self-pay | Admitting: Pain Medicine

## 2014-12-08 ENCOUNTER — Ambulatory Visit: Payer: 59 | Attending: Pain Medicine | Admitting: Pain Medicine

## 2014-12-08 ENCOUNTER — Encounter: Payer: Self-pay | Admitting: Pain Medicine

## 2014-12-08 VITALS — BP 128/87 | HR 75 | Temp 98.2°F | Resp 16 | Ht 63.5 in | Wt 176.0 lb

## 2014-12-08 DIAGNOSIS — M5030 Other cervical disc degeneration, unspecified cervical region: Secondary | ICD-10-CM | POA: Insufficient documentation

## 2014-12-08 DIAGNOSIS — M7060 Trochanteric bursitis, unspecified hip: Secondary | ICD-10-CM | POA: Insufficient documentation

## 2014-12-08 DIAGNOSIS — G5600 Carpal tunnel syndrome, unspecified upper limb: Secondary | ICD-10-CM | POA: Diagnosis not present

## 2014-12-08 DIAGNOSIS — G540 Brachial plexus disorders: Secondary | ICD-10-CM | POA: Diagnosis not present

## 2014-12-08 DIAGNOSIS — Z8781 Personal history of (healed) traumatic fracture: Secondary | ICD-10-CM | POA: Diagnosis not present

## 2014-12-08 DIAGNOSIS — M5412 Radiculopathy, cervical region: Secondary | ICD-10-CM | POA: Diagnosis not present

## 2014-12-08 DIAGNOSIS — M533 Sacrococcygeal disorders, not elsewhere classified: Secondary | ICD-10-CM

## 2014-12-08 DIAGNOSIS — G5603 Carpal tunnel syndrome, bilateral upper limbs: Secondary | ICD-10-CM

## 2014-12-08 DIAGNOSIS — M5136 Other intervertebral disc degeneration, lumbar region: Secondary | ICD-10-CM

## 2014-12-08 DIAGNOSIS — S82891A Other fracture of right lower leg, initial encounter for closed fracture: Secondary | ICD-10-CM

## 2014-12-08 DIAGNOSIS — M79601 Pain in right arm: Secondary | ICD-10-CM | POA: Diagnosis present

## 2014-12-08 DIAGNOSIS — M542 Cervicalgia: Secondary | ICD-10-CM | POA: Diagnosis present

## 2014-12-08 DIAGNOSIS — M79602 Pain in left arm: Secondary | ICD-10-CM | POA: Diagnosis present

## 2014-12-08 DIAGNOSIS — M47816 Spondylosis without myelopathy or radiculopathy, lumbar region: Secondary | ICD-10-CM

## 2014-12-08 DIAGNOSIS — M5481 Occipital neuralgia: Secondary | ICD-10-CM

## 2014-12-08 MED ORDER — OXYCODONE HCL 10 MG PO TABS: ORAL_TABLET | ORAL | Status: DC

## 2014-12-08 NOTE — Progress Notes (Signed)
Safety precautions to be maintained throughout the outpatient stay will include: orient to surroundings, keep bed in low position, maintain call bell within reach at all times, provide assistance with transfer out of bed and ambulation.  

## 2014-12-08 NOTE — Patient Instructions (Addendum)
PLAN   Continue present medication Mobic Norflex Zanaflex and oxycodone. Apply Voltaren gel to wrists for times per day and wear wrist splints as discussed  F/U PCP Dr.N Humphrey Rolls   for evaliation of  BP and general medical  condition  F/U surgical evaluation. May consider pending follow-up evaluations  F/U  Dr.Faheem as planned and as scheduled  May consider cervical MRI, PNCV EMG studies and other studies to evaluate for cervical radiculopathy, carpal tunnel syndrome, cervical rib with thoracic outlet syndrome and other abnormalities which may be causing upper extremity symptoms  F/U neurological evaluation. May consider pending follow-up evaluations  May consider radiofrequency rhizolysis or intraspinal procedures pending response to present treatment and F/U evaluation   Patient to call [facility] should patient have concerns prior to scheduled r

## 2014-12-08 NOTE — Progress Notes (Signed)
Subjective:    Patient ID: Shannon Crawford, female    DOB: April 01, 1955, 59 y.o.   MRN: [id number]  HPI  Patient is 59 year old female returns to [facility] for further evaluation and treatment of pain involving the region of the neck upper extremity region with numbness and tingling sensation of the left upper extremity. We discussed patient's condition on today's visit. We discussed interventional treatment as well as modification of medications and further diagnostic testing. We discussed obtaining cervical MRI, PNCV EMG studies, and other studies for evaluation of blood may be cervical radiculopathy, carpal tunnel syndrome, thoracic outlet syndrome with cervical rib and other entities. At the present time we will proceed with patient wearing wrist splints and patient will continue medications consisting ofbic Norflex and Zanaflex. Patient will also apply Voltaren gel to the wrists while wearing splints. The patient was with understanding and agreement with suggested treatment plan   Review of Systems     Objective:   Physical Exam There was tenderness of the splenius capitis and occipitalis musculature region of mild to moderate degree. There was moderate muscle spasm in the paraspinal musculature region of the cervical region and thoracic region. There was muscle spasm evidence of the trapezius levator scapula and rhomboid musculature region. There was mild tenderness of the acromioclavicular and glenohumeral joint regions. Patient was without remarkable findings on Spurling's maneuver. There was slightly decreased grip strength. There was questionable decreased sensation of C5-6 dermatomal distribution of the left upper extremity. Patient was without significant increase of pain with Tinel and Phalen's maneuver. Tinel and Phalen's maneuver did produce moderate discomfort after repeated attempts. Grip strength was decreased on the left compared to right. Palpation over the thoracic facet  thoracic paraspinal musculature as well as without crepitus of the thoracic region. Palpation over the lumbar paraspinal muscle lumbar facet region was tends to palpation of mild degree. Lateral bending and rotation and extension to palpation lumbar facets reproduce mild discomfort. There was mild tinnitus of the PSIS and PII S regions regions as well as the gluteal and piriformis musculature region. Straight leg raising tolerated to 30 without increased pain with dorsiflexion noted. There was negative clonus negative Homans. DTRs appear to be trace at the knees. There was no abdominal tends to palpation costovertebral angle tenderness       Assessment & Plan:   Degenerative disc disease cervical spine  Cervical radiculopathy  Cervical facet syndrome  Carpal tunnel syndrome  Thoracic outlet syndrome (will consider additional studies to evaluate for thoracic outlet syndrome)  Degenerative disc disease of the lumbar spine   Lumbar facet syndrome  Greater trochanteric bursitis  Sacroiliac joint dysfunction  Greater trochanteric bursitis  Status post lower extremity fracture ( ankle )     PLAN   Continue present medications Norflex Mobic Zanaflex Voltaren gel and oxycodone  F/U PCP  N Khan  for evaliation of  BP and general medical  condition  F/U surgical evaluation. May consider pending follow-up evaluations  F/U neurological evaluation. May consider PNCV EMG studies and other studies pending follow-up evaluations as discussed  Cervical MRI may be considered to evaluate for cervical radiculopathy and other studies  Vascular studies of the upper extremity may be considered to evaluate for cervical rib ( thoracic outlet syndrome )  May consider radiofrequency rhizolysis or intraspinal procedures pending response to present treatment and F/U evaluation   Patient to call [facility] should patient have concerns prior to scheduled return appointment.

## 2014-12-31 ENCOUNTER — Encounter: Payer: Self-pay | Admitting: Psychiatry

## 2014-12-31 ENCOUNTER — Ambulatory Visit (INDEPENDENT_AMBULATORY_CARE_PROVIDER_SITE_OTHER): Payer: 59 | Admitting: Psychiatry

## 2014-12-31 VITALS — BP 132/88 | HR 78 | Temp 97.6°F | Ht 63.5 in | Wt 182.6 lb

## 2014-12-31 DIAGNOSIS — F331 Major depressive disorder, recurrent, moderate: Secondary | ICD-10-CM

## 2014-12-31 MED ORDER — ZOLPIDEM TARTRATE 5 MG PO TABS: 5.0000 mg | ORAL_TABLET | Freq: Every evening | ORAL | Status: DC | PRN

## 2014-12-31 MED ORDER — DULOXETINE HCL 60 MG PO CPEP
60.0000 mg | ORAL_CAPSULE | Freq: Every day | ORAL | Status: DC
Start: 2014-12-31 — End: 2015-04-01

## 2014-12-31 NOTE — Progress Notes (Signed)
[facility] MD/PA/NP OP Progress Note  12/31/2014 1:13 PM Shannon Crawford  MRN:  [id number]  Subjective:   Pt  is a 59 year old female who presented for the follow-up appointment. She reported that she has having some pain in her hands relates to the nerve problems. She reported that she has stopped eating due to pain. She reported that she has been taking when necessary zolpidem at night but is having problems with sleep. She has decrease the use of Xanax. Patient reported that she takes Cymbalta on a regular basis. She appeared more calm and cooperative during the interview. She denied having any adverse effects of the medication. She states that she enjoys the other and has been exercising on a regular basis. She denied having any adverse effects of medications at this time. She reported that she will discontinue the zolpidem to help her sleep at night. She has enough supply of xanax  at home.   Chief Complaint:  Chief Complaint    Follow-up; Medication Refill     Visit Diagnosis:     ICD-9-CM ICD-10-CM   1. MDD (major depressive disorder), recurrent episode, moderate ([place]) 296.32 F33.1     Past Medical History:  Past Medical History  Diagnosis Date  . Thyroid disease   . Fracture     ankle  . Heart murmur   . Hypothyroid   . Anxiety     Past Surgical History  Procedure Laterality Date  . Abdominal surgery      tummy tuck   Family History:  Family History  Problem Relation Age of Onset  . Hypertension Mother   . Alcohol abuse Father   . Anxiety disorder Sister   . Prostate cancer Brother   . Hypertension Sister    Social History:  Social History   Social History  . Marital Status: Married    Spouse Name: N/A  . Number of Children: N/A  . Years of Education: N/A   Social History Main Topics  . Smoking status: Never Smoker   . Smokeless tobacco: Never Used  . Alcohol Use: 0.6 oz/week    0 Standard drinks or equivalent, 0 Glasses of wine, 1 Cans of beer, 0 Shots of liquor  per week  . Drug Use: No  . Sexual Activity: Yes   Other Topics Concern  . None   Social History Narrative   Additional History: Married and lives with husband.   Assessment:   Musculoskeletal: Strength & Muscle Tone: within normal limits Gait & Station: normal Patient leans: N/A  Psychiatric Specialty Exam: HPI   Review of Systems  Musculoskeletal: Positive for back pain.  Psychiatric/Behavioral: Positive for depression. The patient is nervous/anxious.   All other systems reviewed and are negative.   Blood pressure 132/88, pulse 78, temperature 97.6 F (36.4 C), temperature source Tympanic, height 5' 3.5" (1.613 m), weight 182 lb 9.6 oz (82.827 kg), SpO2 96 %.Body mass index is 31.83 kg/(m^2).  General Appearance: Casual  Eye Contact:  Fair  Speech:  Clear and Coherent and Normal Rate  Volume:  Normal  Mood:  Euthymic  Affect:  Congruent  Thought Process:  Goal Directed  Orientation:  Full (Time, Place, and Person)  Thought Content:  WDL  Suicidal Thoughts:  No  Homicidal Thoughts:  No  Memory:  Immediate;   Fair  Judgement:  Fair  Insight:  Fair  Psychomotor Activity:  Normal  Concentration:  Fair  Recall:  [employer] of Knowledge: Fair  Language: Fair  Akathisia:  No  Handed:  Right  AIMS (if indicated):    Assets:  Communication Skills Desire for Improvement Physical Health Social Support  ADL's:  Intact  Cognition: WNL  Sleep:     Is the patient at risk to self?  No. Has the patient been a risk to self in the past 6 months?  No. Has the patient been a risk to self within the distant past?  No. Is the patient a risk to others?  No. Has the patient been a risk to others in the past 6 months?  No. Has the patient been a risk to others within the distant past?  No.  Current Medications: Current Outpatient Prescriptions  Medication Sig Dispense Refill  . ALPRAZolam (XANAX) 0.5 MG tablet Take 1 tablet (0.5 mg total) by mouth daily as needed for  anxiety. 30 tablet 0  . DULoxetine (CYMBALTA) 60 MG capsule Take 1 capsule (60 mg total) by mouth daily. 90 capsule 2  . levothyroxine (SYNTHROID, LEVOTHROID) 88 MCG tablet Take 88 mcg by mouth daily.    Marland Kitchen lidocaine (LIDODERM) 5 % Place 3 patches onto the skin as needed. 1-3 patches to painful areas. Remove & Discard patch within 12 hours or as directed by MD    . meloxicam (MOBIC) 7.5 MG tablet Take 7.5 mg by mouth as needed.     . Oxycodone HCl 10 MG TABS Limit 1/2-1 tablet by mouth twice a day to 3 times a day if tolerated 90 tablet 0  . Pitavastatin Calcium 2 MG TABS Take 2 mg by mouth daily.    . tizanidine (ZANAFLEX) 2 MG capsule Take 2 mg by mouth as needed. 1-2 at bedtime    . zolpidem (AMBIEN) 5 MG tablet Take 1 tablet (5 mg total) by mouth at bedtime as needed for sleep. 30 tablet 2   No current facility-administered medications for this visit.    Medical Decision Making:  Review of Psycho-Social Stressors (1) and Review and summation of old records (2)  Treatment Plan Summary:Medication management   Depression  Continue Cymbalta 60 mg by mouth daily she also takes Xanax 0.5 mg and a when necessary basis.  Insomnia   Patient sleeps well with the help of zolpidem 5 mg by mouth daily at bedtime   medications refilled for the next 3 months. Follow up in 3 months.   More than 50% of the time spent in psychoeducation, counseling and coordination of care.    This note was generated in part or whole with voice recognition software. Voice regonition is usually quite accurate but there are transcription errors that can and very often do occur. I apologize for any typographical errors that were not detected and corrected.    Rainey Pines 12/31/2014, 1:13 PM

## 2015-01-07 ENCOUNTER — Ambulatory Visit: Payer: 59 | Attending: Pain Medicine | Admitting: Pain Medicine

## 2015-01-07 ENCOUNTER — Encounter: Payer: Self-pay | Admitting: Pain Medicine

## 2015-01-07 VITALS — BP 132/77 | HR 66 | Temp 98.1°F | Resp 16 | Ht 63.0 in | Wt 180.0 lb

## 2015-01-07 DIAGNOSIS — M5136 Other intervertebral disc degeneration, lumbar region: Secondary | ICD-10-CM

## 2015-01-07 DIAGNOSIS — S82891A Other fracture of right lower leg, initial encounter for closed fracture: Secondary | ICD-10-CM

## 2015-01-07 DIAGNOSIS — M5481 Occipital neuralgia: Secondary | ICD-10-CM

## 2015-01-07 DIAGNOSIS — G5603 Carpal tunnel syndrome, bilateral upper limbs: Secondary | ICD-10-CM

## 2015-01-07 DIAGNOSIS — G5600 Carpal tunnel syndrome, unspecified upper limb: Secondary | ICD-10-CM

## 2015-01-07 DIAGNOSIS — M47816 Spondylosis without myelopathy or radiculopathy, lumbar region: Secondary | ICD-10-CM

## 2015-01-07 DIAGNOSIS — M533 Sacrococcygeal disorders, not elsewhere classified: Secondary | ICD-10-CM

## 2015-01-07 MED ORDER — TIZANIDINE HCL 2 MG PO CAPS: ORAL_CAPSULE | ORAL | Status: DC

## 2015-01-07 MED ORDER — OXYCODONE HCL 10 MG PO TABS: ORAL_TABLET | ORAL | Status: DC

## 2015-01-07 NOTE — Progress Notes (Signed)
Safety precautions to be maintained throughout the outpatient stay will include: orient to surroundings, keep bed in low position, maintain call bell within reach at all times, provide assistance with transfer out of bed and ambulation.  

## 2015-01-07 NOTE — Progress Notes (Signed)
Subjective:    Patient ID: Shannon Crawford, female    DOB: [birth year], 59 y.o.   MRN: [id number]  HPI The patient is a 59 year old female who returns to pain management for further evaluation and treatment of pain involving the neck upper extremity regions as well as the mid lower back and lower extremity region. Patient admits to some numbness and tingling of the upper extremity on the left. We have asked patient in terms of cervical MRI as well as PNCV EMG studies and surgical evaluation. Patient wishes to delay further studies and evaluation at this time. We will continue present medications consisting of Mobic Zanaflex and oxycodone. We will consider modification of treatment as well as additional studies as discussed and explained to patient pending follow-up evaluation. Patient denies, change in events sedated him to cause change in symptomatology. Digested treatment plan.   Review of Systems     Objective:   Physical Exam  There was tenderness to palpation over the region of the cervical facet cervical paraspinal musculature region of mild to moderate degree. There appeared to be unremarkable Spurling's maneuver. Patient appeared to be with fairly equal grip strength. Tinel and Phalen's maneuver were without increased pain of severe degree. No definite sensory deficit of the upper extremities were noted. Patient admitted to some numbness tingling and dermatomal distribution of approximately C5-C region. There was tenderness to palpation over the thoracic facet thoracic paraspinal muscles with no crepitus of the thoracic region noted. Palpation over the lumbar paraspinal muscles lumbar facet region was attends to palpation of mild to moderate degree. Lateral bending and rotation extension and palpation of the lumbar facets reproduce mild to moderate discomfort. Straight leg raise was tolerates approximately 30 without increased pain with dorsiflexion. There was negative clonus negative Homans.  DTRs were difficult to elicit patient had difficulty relaxing. No sensory deficit or dermatomal distribution lower extremities noted. EHL strength appeared to be slightly decreased. There was mild to moderate tenderness to palpation of the gluteal and piriformis musculature regions as well as the PSIS and PII S regions and the greater trochanteric region and iliotibial band regions Abdomen was nontender with no costovertebral tenderness noted.      Assessment & Plan:  Degenerative disc disease cervical spine  Cervical radiculopathy  Cervical facet syndrome  Carpal tunnel syndrome  Thoracic outlet syndrome (will consider additional studies to evaluate for thoracic outlet syndrome)  Degenerative disc disease of the lumbar spine   Lumbar facet syndrome    PLAN    Continue present medication Mobic  Zanaflex and oxycodone.   F/U PCP Dr.N Welton Flakes   for evaliation of  BP and general medical  condition  F/U surgical evaluation. May consider pending follow-up evaluations  F/U  Dr.Faheem as planned and as scheduled  May consider cervical MRI, PNCV EMG studies and other studies   Follow up Dr.Faheem as discussed  F/U neurological evaluation. May consider pending follow-up evaluations  May consider radiofrequency rhizolysis or intraspinal procedures pending response to present treatment and F/U evaluation   Patient to call [facility] should patient have concerns prior to scheduled appointment

## 2015-01-07 NOTE — Patient Instructions (Signed)
PLAN   Continue present medication Mobic  Zanaflex and oxycodone.   F/U PCP Dr.N Humphrey Rolls   for evaliation of  BP and general medical  condition  F/U surgical evaluation. May consider pending follow-up evaluations  F/U  Dr.Faheem as planned and as scheduled  May consider cervical MRI, PNCV EMG studies and other studies   F/U neurological evaluation. May consider pending follow-up evaluations  May consider radiofrequency rhizolysis or intraspinal procedures pending response to present treatment and F/U evaluation   Patient to call [facility] should patient have concerns prior to scheduled appointment

## 2015-01-07 NOTE — Progress Notes (Signed)
Oxycodone script given to patient.

## 2015-02-09 ENCOUNTER — Ambulatory Visit: Payer: 59 | Attending: Pain Medicine | Admitting: Pain Medicine

## 2015-02-09 ENCOUNTER — Encounter: Payer: Self-pay | Admitting: Pain Medicine

## 2015-02-09 VITALS — BP 143/78 | HR 72 | Temp 97.9°F | Resp 16 | Ht 64.5 in | Wt 178.0 lb

## 2015-02-09 DIAGNOSIS — M5010 Cervical disc disorder with radiculopathy, unspecified cervical region: Secondary | ICD-10-CM | POA: Diagnosis not present

## 2015-02-09 DIAGNOSIS — M5481 Occipital neuralgia: Secondary | ICD-10-CM

## 2015-02-09 DIAGNOSIS — M5136 Other intervertebral disc degeneration, lumbar region: Secondary | ICD-10-CM | POA: Diagnosis not present

## 2015-02-09 DIAGNOSIS — G5600 Carpal tunnel syndrome, unspecified upper limb: Secondary | ICD-10-CM

## 2015-02-09 DIAGNOSIS — G5603 Carpal tunnel syndrome, bilateral upper limbs: Secondary | ICD-10-CM

## 2015-02-09 DIAGNOSIS — M47816 Spondylosis without myelopathy or radiculopathy, lumbar region: Secondary | ICD-10-CM

## 2015-02-09 DIAGNOSIS — M533 Sacrococcygeal disorders, not elsewhere classified: Secondary | ICD-10-CM

## 2015-02-09 DIAGNOSIS — S82891A Other fracture of right lower leg, initial encounter for closed fracture: Secondary | ICD-10-CM

## 2015-02-09 DIAGNOSIS — M542 Cervicalgia: Secondary | ICD-10-CM | POA: Diagnosis present

## 2015-02-09 DIAGNOSIS — G540 Brachial plexus disorders: Secondary | ICD-10-CM | POA: Insufficient documentation

## 2015-02-09 MED ORDER — OXYCODONE HCL 10 MG PO TABS: ORAL_TABLET | ORAL | Status: DC

## 2015-02-09 NOTE — Progress Notes (Signed)
Subjective:    Patient ID: Shannon Crawford, female    DOB: 1955-06-26, 59 y.o.   MRN: [id number]  HPI  Patient is a 59 year old female who returns to pain management Center for further evaluation and treatment of pain involving the region of the neck upper extremity regions lower back and lower extremity regions. Patient is with pain fairly well controlled present time. Patient has had some pain involving the region of the left knee. Patient states that she was performing chores around the house and was working on her knees. We informed patient that she may have bursitis of the knee and that we would continue to observe response to the present treatment regimen and consider studies of the knees. These symptoms persist. The patient preferred to avoid MRI over the stitches of the knee at this time and wished to observe response to present treatment. We will continue medications as prescribed and patient is to call pain management should they be change in condition prior to scheduled return appointment. Patient states that overall the pain appears to be fairly well-controlled in the  other regions. The patient states that the numbness and tingling of the upper extremities has resolved and is without increase of pain with range of motion maneuvers of the cervical region. We will avoid MRI of the cervical region and we'll avoid PNCV EMG studies and other studies at this time as well Patient is without other trauma change in events of daily living the cost change in symptomatology. We've suggested treatment plan      Review of Systems     Objective:   Physical Exam   There was tenderness of the splenius capitis and occipitalis musculature region a mild degree. Patient appeared to be with slightly decreased grip strength. There appeared to be unremarkable Spurling's maneuver and Tinel and Phalen's maneuver was associated with mild discomfort. Palpation over the thoracic facet thoracic paraspinal  musculature region was without crepitus of the thoracic region. Palpation of the cervical paraspinal musculature and cervical facet region was with mild to moderate tends to palpation was tends to palpation of the levator scapula rhomboid musculature region a mild to moderate degree. Palpation over the lumbar paraspinal muscular treat lumbar facet region was attends to palpation of moderate degree lateral bending rotation associated with mild to moderate discomfort. There was mild to moderate tenderness to palpation of the PSIS and PII S region as well as the gluteal and piriformis musculature region with moderate tends to palpation of the greater trochanteric region iliotibial band region. Straight leg raising was tolerated to 30 without increased pain with dorsiflexion noted. DTRs appeared to be trace at the knees. The knee was attends to palpation with no increased warmth erythema of the knee with negative anterior and posterior drawer signs without ballottement of the patella. There was negative clonus negative Homans. Abdomen nontender with no costovertebral tenderness noted     Assessment & Plan:     Degenerative disc disease cervical spine  Cervical radiculopathy  Cervical facet syndrome  Carpal tunnel syndrome  Thoracic outlet syndrome (will consider additional studies to evaluate for thoracic outlet syndrome)  Degenerative disc disease of the lumbar spine   Lumbar facet syndrome     PLAN   Continue present medication Mobic  Zanaflex and oxycodone.   F/U PCP Dr.N Welton Flakes   for evaliation of  BP arthritis and general medical  condition. May need rheumatological panel  F/U surgical evaluation. May consider pending follow-up evaluations  F/U  Dr.Faheem  as planned and as scheduled  May consider cervical MRI, PNCV EMG studies and other studies as discussed. We will delay at this time  Will consider MRI of knee as well as additional studies pending follow-up evaluation. Discuss  rheumatological panel with Dr. Valrie Hart  F/U neurological evaluation. May consider pending follow-up evaluations  May consider radiofrequency rhizolysis or intraspinal procedures pending response to present treatment and F/U evaluation   Patient to call Pain Management Center should patient have concerns prior to scheduled appointment

## 2015-02-09 NOTE — Progress Notes (Signed)
Patient is here for medication refill, has some new joint pain to discuss in R hand and R knee.  .Safety precautions to be maintained throughout the outpatient stay will include: orient to surroundings, keep bed in low position, maintain call bell within reach at all times, provide assistance with transfer out of bed and ambulation.

## 2015-02-09 NOTE — Patient Instructions (Addendum)
PLAN   Continue present medication Mobic  Zanaflex and oxycodone.   F/U PCP Dr.N Humphrey Rolls   for evaliation of  BP arthritis and general medical  condition. May need rheumatological panel  F/U surgical evaluation. May consider pending follow-up evaluations  F/U  Dr.Faheem as planned and as scheduled  May consider cervical MRI, PNCV EMG studies and other studies as discussed. We will delay at this time  Will consider MRI of knee as well as additional studies pending follow-up evaluation. Discuss rheumatological panel with Dr. Wolfgang Phoenix  F/U neurological evaluation. May consider pending follow-up evaluations  May consider radiofrequency rhizolysis or intraspinal procedures pending response to present treatment and F/U evaluation   Patient to call Pain Management Center should patient have concerns prior to scheduled appointment

## 2015-02-11 ENCOUNTER — Other Ambulatory Visit: Payer: Self-pay | Admitting: Pain Medicine

## 2015-03-11 ENCOUNTER — Encounter (INDEPENDENT_AMBULATORY_CARE_PROVIDER_SITE_OTHER): Payer: Self-pay

## 2015-03-11 ENCOUNTER — Encounter: Payer: Self-pay | Admitting: Pain Medicine

## 2015-03-11 ENCOUNTER — Ambulatory Visit: Payer: 59 | Attending: Pain Medicine | Admitting: Pain Medicine

## 2015-03-11 ENCOUNTER — Other Ambulatory Visit: Payer: Self-pay | Admitting: Pain Medicine

## 2015-03-11 VITALS — BP 152/82 | HR 66 | Temp 98.4°F | Resp 16 | Wt 180.0 lb

## 2015-03-11 DIAGNOSIS — G540 Brachial plexus disorders: Secondary | ICD-10-CM | POA: Diagnosis not present

## 2015-03-11 DIAGNOSIS — M542 Cervicalgia: Secondary | ICD-10-CM | POA: Diagnosis present

## 2015-03-11 DIAGNOSIS — M5136 Other intervertebral disc degeneration, lumbar region: Secondary | ICD-10-CM | POA: Insufficient documentation

## 2015-03-11 DIAGNOSIS — M533 Sacrococcygeal disorders, not elsewhere classified: Secondary | ICD-10-CM

## 2015-03-11 DIAGNOSIS — M5010 Cervical disc disorder with radiculopathy, unspecified cervical region: Secondary | ICD-10-CM | POA: Diagnosis not present

## 2015-03-11 DIAGNOSIS — M5481 Occipital neuralgia: Secondary | ICD-10-CM

## 2015-03-11 DIAGNOSIS — M454 Ankylosing spondylitis of thoracic region: Secondary | ICD-10-CM | POA: Diagnosis present

## 2015-03-11 DIAGNOSIS — G5600 Carpal tunnel syndrome, unspecified upper limb: Secondary | ICD-10-CM | POA: Diagnosis not present

## 2015-03-11 DIAGNOSIS — S82891A Other fracture of right lower leg, initial encounter for closed fracture: Secondary | ICD-10-CM

## 2015-03-11 DIAGNOSIS — M47816 Spondylosis without myelopathy or radiculopathy, lumbar region: Secondary | ICD-10-CM

## 2015-03-11 DIAGNOSIS — G5603 Carpal tunnel syndrome, bilateral upper limbs: Secondary | ICD-10-CM

## 2015-03-11 MED ORDER — OXYCODONE HCL 10 MG PO TABS: ORAL_TABLET | ORAL | Status: DC

## 2015-03-11 NOTE — Progress Notes (Signed)
Subjective:    Patient ID: Shannon Crawford, female    DOB: 1956/01/26, 60 y.o.   MRN: [id number]  HPI  The patient is a 60 year old female who returns to pain management for further evaluation and treatment of pain involving the region of the neck upper extremity regions lower back and lower extremity regions. The patient states that she has had some weight gain since she has begin taking antidepressant medication. Patient also discussed need for follow-up evaluation of her thyroid condition. We discussed patient's condition on today's visit including need for further assessment of rheumatological condition and patient. The patient will follow-up with Dr. kneeling condyle and review her previous laboratory studies and discuss need for further evaluation of her condition. We also discussed patient's treatment in pain management Center consisting of medications as well as interventional treatment and will continue with medical medications at this time and avoid interventional treatment. The patient was with understanding and in agreement with suggested treatment plan. The patient admitted to a decrease in her exercise regimen and stated that she had not been his Lorio to exercising as she previously had been. Patient attributed weight gain to the antidepressants as well as the lack of exercising. The patient admitted to reoccurring pain of the right gluteal and piriformis musculature region which reproduces pain of significant degree at times as well as greater trochanteric bursitis on the right side We discussed patient's condition and will continue to observe response to present treatment and we'll consider interventional treatment as well as additional modifications of treatment pending follow-up evaluation. The patient was with understanding and agreement suggested treatment plan.  Review of Systems     Objective:   Physical Exam  There was tenderness to palpation of paraspinal misreading the  cervical region cervical facet region with tenderness of the splenius capitis and occipitalis region a moderate agree. There was mild tenderness of the acromioclavicular and glenohumeral joint region and palpation over the region of the cervical facet cervical paraspinal muscles and thoracic facet thoracic paraspinal must reason was attends to palpation of moderate degree. There was moderate tenderness to palpation of the thoracic facet thoracic paraspinal musculature region with no crepitus of the thoracic region noted. Tinel and Phalen's maneuver associated with mild to moderate discomfort. No increased warmth erythema of the upper extremities noted. No excessive tends to palpation of the medial and lateral epicondyles of the elbows. Palpation over the lumbar paraspinal must reason lumbar facet region was attends to palpation of mild to moderate degree with lateral bending rotation extension and palpation of lumbar facets reproducing mild to moderate discomfort.. There was mild to moderate tenderness of the PSIS and PII S region with moderate to moderately severe tenderness to palpation of the gluteal and piriformis musculature region especially on the right side.. There was moderate to moderately severe tenderness to palpation greater trochanteric region and iliotibial band region on the right side compared to the left side. EHL strength appeared to be slightly decreased and no sensory deficit or dermatomal distribution detected. DTRs were difficult to elicit patient had difficulty relaxing. Straight leg raise was tolerates proximal to 30 without increased pain with dorsiflexion noted there was negative clonus negative Homans. Abdomen nontender with no costovertebral tenderness noted      Assessment & Plan:  Degenerative disc disease cervical spine  Cervical radiculopathy  Cervical facet syndrome  Carpal tunnel syndrome  Thoracic outlet syndrome (will consider additional studies to evaluate for  thoracic outlet syndrome)  Degenerative disc disease of the  lumbar spine   Lumbar facet syndrome    F/U PCP Dr.N Welton Flakes   for evaliation of  BP arthritis and general medical  condition. May need rheumatological panel  F/U surgical evaluation. May consider pending follow-up evaluations  F/U  Dr.Faheem as planned and as scheduled  May consider cervical MRI, PNCV EMG studies and other studies as discussed. We will delay at this time  Will consider MRI of knee as well as additional studies pending follow-up evaluation. Discuss rheumatological panel with Dr. Valrie Hart  F/U neurological evaluation. May consider pending follow-up evaluations  May consider radiofrequency rhizolysis or intraspinal procedures pending response to present treatment and F/U evaluation   Patient to call Pain Management Center should patient have concerns prior to scheduled appointment    PLAN   Continue present medication Mobic  Zanaflex and oxycodone.   F/U PCP Dr.N Welton Flakes   for evaliation of  BP arthritis and general medical  condition. May need rheumatological panel as previously discussed  F/U surgical evaluation. May consider pending follow-up evaluations  F/U  Dr.Faheem as planned and as scheduled  May consider cervical MRI, PNCV EMG studies and other studies as discussed.  Will consider MRI of knee as well as additional studies pending follow-up evaluation. Discuss rheumatological panel with Dr. Valrie Hart  Repeat UDS  F/U neurological evaluation. May consider pending follow-up evaluations  May consider radiofrequency rhizolysis or intraspinal procedures pending response to present treatment and F/U evaluation   Patient to call Pain Management Center should patient have concerns prior to scheduled appointment

## 2015-03-11 NOTE — Patient Instructions (Addendum)
F/U PCP Dr.N Humphrey Rolls   for evaliation of  BP arthritis and general medical  condition. May need rheumatological panel  F/U surgical evaluation. May consider pending follow-up evaluations  F/U  Dr.Faheem as planned and as scheduled  May consider cervical MRI, PNCV EMG studies and other studies as discussed. We will delay at this time  Will consider MRI of knee as well as additional studies pending follow-up evaluation. Discuss rheumatological panel with Dr. Wolfgang Phoenix  F/U neurological evaluation. May consider pending follow-up evaluations  May consider radiofrequency rhizolysis or intraspinal procedures pending response to present treatment and F/U evaluation   Patient to call Pain Management Center should patient have concerns prior to scheduled appointmentPLAN   Continue present medication Mobic  Zanaflex and oxycodone.   F/U PCP Dr.N Humphrey Rolls   for evaliation of  BP arthritis and general medical  condition. May need rheumatological panel as previously discussed  F/U surgical evaluation. May consider pending follow-up evaluations  F/U  Dr.Faheem as planned and as scheduled  May consider cervical MRI, PNCV EMG studies and other studies as discussed.  Will consider MRI of knee as well as additional studies pending follow-up evaluation. Discuss rheumatological panel with Dr. Wolfgang Phoenix  Repeat UDS  F/U neurological evaluation. May consider pending follow-up evaluations  May consider radiofrequency rhizolysis or intraspinal procedures pending response to present treatment and F/U evaluation   Patient to call Pain Management Center should patient have concerns prior to scheduled appointment

## 2015-03-11 NOTE — Progress Notes (Signed)
Safety precautions to be maintained throughout the outpatient stay will include: orient to surroundings, keep bed in low position, maintain call bell within reach at all times, provide assistance with transfer out of bed and ambulation.  

## 2015-03-19 LAB — TOXASSURE SELECT 13 (MW), URINE: PDF: 0

## 2015-03-23 ENCOUNTER — Other Ambulatory Visit: Payer: Self-pay | Admitting: Pain Medicine

## 2015-04-01 ENCOUNTER — Encounter: Payer: Self-pay | Admitting: Psychiatry

## 2015-04-01 ENCOUNTER — Ambulatory Visit (INDEPENDENT_AMBULATORY_CARE_PROVIDER_SITE_OTHER): Payer: 59 | Admitting: Psychiatry

## 2015-04-01 VITALS — BP 120/84 | HR 96 | Temp 97.9°F | Ht 64.5 in | Wt 182.0 lb

## 2015-04-01 DIAGNOSIS — F331 Major depressive disorder, recurrent, moderate: Secondary | ICD-10-CM

## 2015-04-01 MED ORDER — TRAZODONE HCL 100 MG PO TABS: 100.0000 mg | ORAL_TABLET | Freq: Every day | ORAL | Status: DC

## 2015-04-01 MED ORDER — ALPRAZOLAM 0.5 MG PO TABS: 0.5000 mg | ORAL_TABLET | Freq: Every day | ORAL | Status: DC | PRN

## 2015-04-01 MED ORDER — DULOXETINE HCL 60 MG PO CPEP: 60.0000 mg | ORAL_CAPSULE | Freq: Every day | ORAL | Status: DC

## 2015-04-01 NOTE — Progress Notes (Signed)
[facility] MD/PA/NP OP Progress Note  04/01/2015 1:11 PM Shannon Crawford  MRN:  [id number]  Subjective:   Pt  is a 60 year old married female  who presented for the follow-up appointment. She reported that she continues to have some depression which has not improved throughout the winter. She is now wondering if she might have seasonal affective disorder. She reported that she has difficulty sleeping at night and she has been taking Ambien on a when necessary basis. Patient reported that she takes Xanax on a when necessary basis as well. She is trying to minimize the use of medications. Patient was tearful during part of the interview. She reported that she does not have any acute stressors at this time. She spends some time with her husband. They will go out for a walk. However she feels socially isolated as her husband is busy doing his own work with the tools and spending time with his friends. She reported that she is planning to join the quilt group and is planning to start doing quilting. She is trying to stay busy for herself. She currently denied having any adverse effects to the medications. She is interested in taking trazodone at bedtime to help with the insomnia. She denied having any suicidal homicidal ideations or plans.  Chief Complaint:  Chief Complaint    Follow-up; Medication Refill; Anxiety; Depression; Fatigue; Stress     Visit Diagnosis:     ICD-9-CM ICD-10-CM   1. MDD (major depressive disorder), recurrent episode, moderate ([place]) 296.32 F33.1     Past Medical History:  Past Medical History  Diagnosis Date  . Thyroid disease   . Fracture     ankle  . Heart murmur   . Hypothyroid   . Anxiety     Past Surgical History  Procedure Laterality Date  . Abdominal surgery      tummy tuck   Family History:  Family History  Problem Relation Age of Onset  . Hypertension Mother   . Alcohol abuse Father   . Anxiety disorder Sister   . Prostate cancer Brother   . Hypertension  Sister    Social History:  Social History   Social History  . Marital Status: Married    Spouse Name: N/A  . Number of Children: N/A  . Years of Education: N/A   Social History Main Topics  . Smoking status: Never Smoker   . Smokeless tobacco: Never Used  . Alcohol Use: 0.6 oz/week    0 Standard drinks or equivalent, 0 Glasses of wine, 1 Cans of beer, 0 Shots of liquor per week  . Drug Use: No  . Sexual Activity: Yes   Other Topics Concern  . None   Social History Narrative   Additional History: Married and lives with husband.   Assessment:   Musculoskeletal: Strength & Muscle Tone: within normal limits Gait & Station: normal Patient leans: N/A  Psychiatric Specialty Exam: Anxiety Symptoms include nervous/anxious behavior.    Depression        Past medical history includes anxiety.     Review of Systems  Musculoskeletal: Positive for back pain.  Psychiatric/Behavioral: Positive for depression. The patient is nervous/anxious.   All other systems reviewed and are negative.   Blood pressure 120/84, pulse 96, temperature 97.9 F (36.6 C), temperature source Tympanic, height 5' 4.5" (1.638 m), weight 182 lb (82.555 kg), SpO2 97 %.Body mass index is 30.77 kg/(m^2).  General Appearance: Casual  Eye Contact:  Fair  Speech:  Clear and Coherent  and Normal Rate  Volume:  Normal  Mood:  Euthymic  Affect:  Congruent  Thought Process:  Goal Directed  Orientation:  Full (Time, Place, and Person)  Thought Content:  WDL  Suicidal Thoughts:  No  Homicidal Thoughts:  No  Memory:  Immediate;   Fair  Judgement:  Fair  Insight:  Fair  Psychomotor Activity:  Normal  Concentration:  Fair  Recall:  [employer] of Knowledge: Fair  Language: Fair  Akathisia:  No  Handed:  Right  AIMS (if indicated):    Assets:  Communication Skills Desire for Improvement Physical Health Social Support  ADL's:  Intact  Cognition: WNL  Sleep:     Is the patient at risk to self?   No. Has the patient been a risk to self in the past 6 months?  No. Has the patient been a risk to self within the distant past?  No. Is the patient a risk to others?  No. Has the patient been a risk to others in the past 6 months?  No. Has the patient been a risk to others within the distant past?  No.  Current Medications: Current Outpatient Prescriptions  Medication Sig Dispense Refill  . ALPRAZolam (XANAX) 0.5 MG tablet Take 1 tablet (0.5 mg total) by mouth daily as needed for anxiety. 30 tablet 0  . DULoxetine (CYMBALTA) 60 MG capsule Take 1 capsule (60 mg total) by mouth daily. 90 capsule 2  . levothyroxine (SYNTHROID, LEVOTHROID) 88 MCG tablet Take 88 mcg by mouth daily.    Marland Kitchen LIVALO 2 MG TABS     . Oxycodone HCl 10 MG TABS Limit 1/2-1 tablet by mouth twice a day to 3 times a day if tolerated 90 tablet 0  . tizanidine (ZANAFLEX) 2 MG capsule Limit 1 tab by mouth 2 tabs by mouth per day if tolerated 60 capsule 0  . zolpidem (AMBIEN) 5 MG tablet Take 1 tablet (5 mg total) by mouth at bedtime as needed for sleep. 30 tablet 2   No current facility-administered medications for this visit.    Medical Decision Making:  Review of Psycho-Social Stressors (1) and Review and summation of old records (2)  Treatment Plan Summary:Medication management   Depression  Continue Cymbalta 60 mg by mouth daily   Patient takes Xanax on a when necessary basis it was refilled for the next 2 months.  Insomnia She will l be started on trazodone 100 mg at bedtime for insomnia. Advised her to take half to 1 pill as needed and she demonstrated understanding.  medications refilled for the next 2 months    More than 50% of the time spent in psychoeducation, counseling and coordination of care.    This note was generated in part or whole with voice recognition software. Voice regonition is usually quite accurate but there are transcription errors that can and very often do occur. I apologize for any  typographical errors that were not detected and corrected.   Rainey Pines, MD  04/01/2015, 1:11 PM

## 2015-04-06 ENCOUNTER — Encounter: Payer: Self-pay | Admitting: Pain Medicine

## 2015-04-07 ENCOUNTER — Encounter: Payer: Self-pay | Admitting: Pain Medicine

## 2015-04-07 ENCOUNTER — Ambulatory Visit: Payer: 59 | Attending: Pain Medicine | Admitting: Pain Medicine

## 2015-04-07 VITALS — BP 123/67 | HR 70 | Temp 97.3°F | Resp 16 | Ht 63.5 in | Wt 180.0 lb

## 2015-04-07 DIAGNOSIS — M546 Pain in thoracic spine: Secondary | ICD-10-CM | POA: Diagnosis present

## 2015-04-07 DIAGNOSIS — G540 Brachial plexus disorders: Secondary | ICD-10-CM | POA: Diagnosis not present

## 2015-04-07 DIAGNOSIS — M5010 Cervical disc disorder with radiculopathy, unspecified cervical region: Secondary | ICD-10-CM | POA: Diagnosis not present

## 2015-04-07 DIAGNOSIS — M533 Sacrococcygeal disorders, not elsewhere classified: Secondary | ICD-10-CM

## 2015-04-07 DIAGNOSIS — G5600 Carpal tunnel syndrome, unspecified upper limb: Secondary | ICD-10-CM | POA: Diagnosis not present

## 2015-04-07 DIAGNOSIS — M5136 Other intervertebral disc degeneration, lumbar region: Secondary | ICD-10-CM | POA: Diagnosis not present

## 2015-04-07 DIAGNOSIS — M47816 Spondylosis without myelopathy or radiculopathy, lumbar region: Secondary | ICD-10-CM

## 2015-04-07 DIAGNOSIS — M542 Cervicalgia: Secondary | ICD-10-CM | POA: Diagnosis present

## 2015-04-07 DIAGNOSIS — M5481 Occipital neuralgia: Secondary | ICD-10-CM

## 2015-04-07 DIAGNOSIS — M79605 Pain in left leg: Secondary | ICD-10-CM | POA: Diagnosis present

## 2015-04-07 DIAGNOSIS — S82891A Other fracture of right lower leg, initial encounter for closed fracture: Secondary | ICD-10-CM

## 2015-04-07 MED ORDER — OXYCODONE HCL 10 MG PO TABS: ORAL_TABLET | ORAL | Status: DC

## 2015-04-07 NOTE — Progress Notes (Signed)
Subjective:    Patient ID: Shannon Crawford, female    DOB: Mar 30, 1955, 60 y.o.   MRN: [id number]  HPI The patient is a 60 year old female who returns to pain management for further evaluation and treatment of pain involving the neck entire back upper and lower extremity regions. The patient denies any trauma change in events of daily living the call significant change in symptomatology. The patient states that she has taken up a new copy which is quilting. The patient states that she has difficulty sleeping. We discussed patient taking Zanaflex at bedtime which may allow her to obtain better sleep. . We discussed patient undergoing rheumatological evaluation and patient will follow-up with Dr. Valrie Hart in this regard as well. We also advised patient to stop the use of Mobic since patient has admitted to some stomach upset. We have asked patient to avoid all type of nonsteroidal anti-inflammatory medications. We will continue Zanaflex and oxycodone at this time and patient will discuss undergoing rheumatological evaluation to be considered for additional medication modifications as well as follow-up with Dr.Faheem for psych follow-up evaluation which may benefit patient in terms of pain is well . The patient agreed to suggested treatment plan   Review of Systems     Objective:   Physical Exam  There was tenderness of the splenius capitis and occipitalis musculature region a mild to moderate degree with palpation of the cervical facet cervical paraspinal musculature region reproducing mild to moderate discomfort. There was mild tenderness of the acromioclavicular and glenohumeral joint region and patient appeared to be with decreased grip strength. There was tenderness of the acromioclavicular and glenohumeral joint region. Patient was able to perform drop test with minimal discomfort. There was unremarkable Spurling's maneuver. There was tenderness over the thoracic facet thoracic paraspinal musculature  region with no crepitus of the thoracic region noted. There was evidence of muscle spasm involving the cervical thoracic and lumbar regions. Palpation over the lumbar paraspinal must reason lumbar facet region was with moderate tenderness to palpation with lateral bending rotation extension and palpation of the lumbar facets reproducing moderate discomfort.. Palpation over the PSIS and PII S region reproduces moderate discomfort. There was moderate tenderness of the gluteal and piriformis musculature region especially the right gluteal musculature region in the region of the greater trochanteric area which produced moderate discomfort.. Palpation of the PSIS and PII S region reproduces moderate discomfort with moderate tenderness of the gluteal and piriformis musculature region as well especially on the right side. Straight leg raising was tolerated to 30 without increase of pain with dorsiflexion noted. No sensory deficit or dermatomal distribution detected. There was mild to moderate tenderness of the greater trochanteric region iliotibial band region especially on the right. There was decreased EHL strength. No sensory deficit or dermatomal distribution detected. There was negative clonus negative Homans. Abdomen was nontender with no costovertebral tenderness noted.      Assessment & Plan:    Degenerative disc disease cervical spine  Cervical radiculopathy  Cervical facet syndrome  Carpal tunnel syndrome  Thoracic outlet syndrome (will consider additional studies to evaluate for thoracic outlet syndrome)  Degenerative disc disease of the lumbar spine   Lumbar facet syndrome       PLAN   Continue present medications as well as  Zanaflex and oxycodone  NO MOBIC OR SIMILAR NONSTEROIDAL ANTI-INFLAMMATORY  MEDICATIONS  F/U PCP Dr.N Welton Flakes   for evaliation of  BP arthritis and general medical  condition.. Also discuss rheumatological evaluation as we  discussed again today  F/U surgical  evaluation. May consider pending follow-up evaluations  F/U  Dr.Faheem as planned and as scheduled  May consider cervical MRI, PNCV EMG studies and other studies as discussed. We will delay at this time  Will consider MRI of knee as well as additional studies pending follow-up evaluation  F/U neurological evaluation. May consider pending follow-up evaluations  May consider radiofrequency rhizolysis or intraspinal procedures pending response to present treatment and F/U evaluation   Patient to call Pain Management Center should patient have concerns prior to scheduled appointment

## 2015-04-07 NOTE — Patient Instructions (Addendum)
PLAN   Continue present medications as well as  Zanaflex and oxycodone  NO MOBIC OR SIMILAR NONSTEROIDAL ANTI-INFLAMMATORY  MEDICATIONS  F/U PCP Dr.N Humphrey Rolls   for evaliation of  BP arthritis and general medical  condition.. Also discuss rheumatological evaluation as we discussed again today  F/U surgical evaluation. May consider pending follow-up evaluations  F/U  Dr.Faheem as planned and as scheduled  May consider cervical MRI, PNCV EMG studies and other studies as discussed. We will delay at this time  Will consider MRI of knee as well as additional studies pending follow-up evaluation  F/U neurological evaluation. May consider pending follow-up evaluations  May consider radiofrequency rhizolysis or intraspinal procedures pending response to present treatment and F/U evaluation   Patient to call Pain Management Center should patient have concerns prior to scheduled appointment

## 2015-04-07 NOTE — Progress Notes (Signed)
Safety precautions to be maintained throughout the outpatient stay will include: orient to surroundings, keep bed in low position, maintain call bell within reach at all times, provide assistance with transfer out of bed and ambulation.  

## 2015-05-05 ENCOUNTER — Ambulatory Visit: Payer: Self-pay | Admitting: Pain Medicine

## 2015-05-06 ENCOUNTER — Encounter: Payer: Self-pay | Admitting: Pain Medicine

## 2015-05-06 ENCOUNTER — Telehealth: Payer: Self-pay | Admitting: Pain Medicine

## 2015-05-06 ENCOUNTER — Ambulatory Visit: Payer: 59 | Attending: Pain Medicine | Admitting: Pain Medicine

## 2015-05-06 VITALS — BP 136/70 | HR 80 | Temp 98.0°F | Resp 16 | Ht 63.0 in | Wt 180.0 lb

## 2015-05-06 DIAGNOSIS — M5136 Other intervertebral disc degeneration, lumbar region: Secondary | ICD-10-CM

## 2015-05-06 DIAGNOSIS — M47816 Spondylosis without myelopathy or radiculopathy, lumbar region: Secondary | ICD-10-CM

## 2015-05-06 DIAGNOSIS — G5600 Carpal tunnel syndrome, unspecified upper limb: Secondary | ICD-10-CM | POA: Diagnosis not present

## 2015-05-06 DIAGNOSIS — M533 Sacrococcygeal disorders, not elsewhere classified: Secondary | ICD-10-CM

## 2015-05-06 DIAGNOSIS — M5010 Cervical disc disorder with radiculopathy, unspecified cervical region: Secondary | ICD-10-CM | POA: Insufficient documentation

## 2015-05-06 DIAGNOSIS — G540 Brachial plexus disorders: Secondary | ICD-10-CM | POA: Diagnosis not present

## 2015-05-06 DIAGNOSIS — M51369 Other intervertebral disc degeneration, lumbar region without mention of lumbar back pain or lower extremity pain: Secondary | ICD-10-CM

## 2015-05-06 DIAGNOSIS — S82891A Other fracture of right lower leg, initial encounter for closed fracture: Secondary | ICD-10-CM

## 2015-05-06 DIAGNOSIS — M546 Pain in thoracic spine: Secondary | ICD-10-CM | POA: Diagnosis present

## 2015-05-06 DIAGNOSIS — M5481 Occipital neuralgia: Secondary | ICD-10-CM

## 2015-05-06 DIAGNOSIS — M545 Low back pain: Secondary | ICD-10-CM | POA: Diagnosis present

## 2015-05-06 DIAGNOSIS — G5603 Carpal tunnel syndrome, bilateral upper limbs: Secondary | ICD-10-CM

## 2015-05-06 MED ORDER — OXYCODONE HCL 10 MG PO TABS: ORAL_TABLET | ORAL | Status: DC

## 2015-05-06 MED ORDER — METANX 3-35-2 MG PO TABS: ORAL_TABLET | ORAL | Status: DC

## 2015-05-06 MED ORDER — BACLOFEN 10 MG PO TABS: ORAL_TABLET | ORAL | Status: DC

## 2015-05-06 NOTE — Progress Notes (Signed)
Safety precautions to be maintained throughout the outpatient stay will include: orient to surroundings, keep bed in low position, maintain call bell within reach at all times, provide assistance with transfer out of bed and ambulation.  

## 2015-05-06 NOTE — Progress Notes (Signed)
Subjective:    Patient ID: Shannon Crawford, female    DOB: [birth year], 60 y.o.   MRN: [id number]  HPI  Patient is a 60 year old female who returns to pain management Center for further evaluation and treatment of pain involving the region of the upper mid lower back lower extremity region. The patient states that the pain involving the lower back lower extremity region with radiates toward the right lower extremity. The patient states that she has recently begun yoga. We discussed patient's condition and discuss medications. We will modify medications on today's visit. Decision was made to replace tires tizanidine with baclofen. We also began Metanx. We discussed patient's consulting with Dr.Faheem to discuss replacing alprazolam with clonazepam. We will avoid interventional treatment at this time and observe patient's response to modification of medications. The patient was with understanding and agreement suggested treatment plan.      Review of Systems     Objective:   Physical Exam   There was tenderness to palpation of paraspinal misreading cervical region cervical facet region a mild degree with unremarkable Spurling's maneuver. There was mild tenderness of the acromioclavicular and glenohumeral joint regions. The patient was with questionably decreased grip strength. Tinel and Phalen's maneuver were without increase of pain of significant degree. Palpation of the thoracic region thoracic facet region was with moderate tenderness to palpation in the mid and lower thoracic region with no crepitus of the thoracic region noted. Palpation over the region of the lumbar paraspinal muscles lumbar facet region was attends to palpation of mild to moderate degree. Lateral bending rotation extension and palpation of the lumbar facets reproduce mild to moderate discomfort. DTRs appeared to be trace at the knees. Straight leg raise was tolerates approximately 30 without a definite increase of pain with  dorsiflexion noted. There was decreased EHL strength on the right compared to the left. Palpation over the PSIS and PII S region was with moderate tends to palpation with mild tenderness of the greater trochanteric region and iliotibial band region there was moderate to moderately severe tenderness to palpation of the gluteal and piriformis musculature region on the right compared to the left. No definite sensory deficit dermatomal dystrophy detected. There was negative clonus negative Homans. Abdomen was nontender with no costovertebral tenderness noted.     Assessment & Plan:     Degenerative disc disease cervical spine  Cervical radiculopathy  Cervical facet syndrome  Carpal tunnel syndrome  Thoracic outlet syndrome (will consider additional studies to evaluate for thoracic outlet syndrome)  Degenerative disc disease of the lumbar spine   Lumbar facet syndrome       PLAN   Continue present medication oxycodone  and began baclofen   NO ZANAFLEX  . Baclofen is replacing Zanaflex. Begin  Metanx NO MOBIC OR SIMILAR NONSTEROIDAL ANTI-INFLAMMATORY  MEDICATIONS . Please ask Dr.Faheem to consider replacing alprazolam with clonazepam which may treat paresthesias of the lower extremity effectively   F/U PCP Dr.N Welton Flakes   for evaliation of  BP arthritis and general medical  condition..   F/U surgical evaluation. May consider pending follow-up evaluations  F/U  Dr.Faheem as planned and as scheduled  May consider cervical MRI, PNCV EMG studies and other studies as discussed. We will delay at this time  Will consider MRI of knee as well as additional studies pending follow-up evaluation as previously discussed  F/U neurological evaluation. May consider PNCV EMG studies and other studies pending follow-up evaluations to further evaluate right lower extremity paresthesias  May  consider radiofrequency rhizolysis or intraspinal procedures pending response to present treatment and F/U  evaluation. We will avoid such treatment at this time   Patient to call Pain Management Center should patient have concerns prior to scheduled appointment

## 2015-05-06 NOTE — Patient Instructions (Addendum)
PLAN   Continue present medication oxycodone  and began baclofen   NO ZANAFLEX  . Baclofen is replacing Zanaflex. Begin  Metanx NO MOBIC OR SIMILAR NONSTEROIDAL ANTI-INFLAMMATORY  MEDICATIONS  F/U PCP Dr.N Humphrey Rolls   for evaliation of  BP arthritis and general medical  condition..   F/U surgical evaluation. May consider pending follow-up evaluations  F/U  Dr.Faheem as planned and as scheduled  May consider cervical MRI, PNCV EMG studies and other studies as discussed. We will delay at this time  Will consider MRI of knee as well as additional studies pending follow-up evaluation as previously discussed  F/U neurological evaluation. May consider PNCV EMG studies and other studies pending follow-up evaluations to further evaluate right lower extremity paresthesias  May consider radiofrequency rhizolysis or intraspinal procedures pending response to present treatment and F/U evaluation. We will avoid such treatment at this time   Patient to call Pain Management Center should patient have concerns prior to scheduled appointment

## 2015-05-06 NOTE — Telephone Encounter (Signed)
Ins will not pay for folic acid/Vit B together, they will pay for folic acid script and she can pick up Vitamin B over the counter, Folic Acid will need to be sent to pharmacy, please call patient and let her know

## 2015-05-10 NOTE — Telephone Encounter (Signed)
Nurses Have patient take vitamin B complex only at this time

## 2015-05-11 NOTE — Telephone Encounter (Signed)
Patient states there is OTC Folic Acid and Vitamin B. Asked if ok to take both of these. Advised ok to take both.

## 2015-05-25 ENCOUNTER — Ambulatory Visit (INDEPENDENT_AMBULATORY_CARE_PROVIDER_SITE_OTHER): Payer: 59 | Admitting: Psychiatry

## 2015-05-25 ENCOUNTER — Encounter: Payer: Self-pay | Admitting: Psychiatry

## 2015-05-25 VITALS — BP 124/86 | HR 68 | Temp 97.4°F | Ht 63.0 in | Wt 183.6 lb

## 2015-05-25 DIAGNOSIS — F331 Major depressive disorder, recurrent, moderate: Secondary | ICD-10-CM

## 2015-05-25 MED ORDER — ZOLPIDEM TARTRATE 5 MG PO TABS: 5.0000 mg | ORAL_TABLET | Freq: Every evening | ORAL | Status: DC | PRN

## 2015-05-25 MED ORDER — ALPRAZOLAM 0.5 MG PO TABS: 0.5000 mg | ORAL_TABLET | Freq: Every day | ORAL | Status: DC | PRN

## 2015-05-25 NOTE — Progress Notes (Signed)
BH MD/PA/NP OP Progress Note  05/25/2015 1:06 PM Shannon Crawford  MRN:  [id number]  Subjective:   Pt  is a 60 year old married female  who presented for the follow-up appointment. She reported that she has started quilting and has been feeling better. She was showing me pictures of her quilts. Patient reported that she has been improving on her medications and she feels that her depression had is now getting better. She reported that she felt that her depressive symptoms are worse during the winter months and since the weather has been changing her symptoms are improving. She has been taking zolpidem on a when necessary basis to help her sleep at night. She also takes Xanax on a when necessary basis. She currently denied having any adverse effects of medications. Patient reported that she wants to have her [place] dose decrease at her next appointment. She appeared calm and collected during the interview.She denied having any suicidal homicidal ideations or plans.  Chief Complaint:  Chief Complaint    Follow-up; Medication Refill     Visit Diagnosis:     ICD-9-CM ICD-10-CM   1. MDD (major depressive disorder), recurrent episode, moderate ([place]) 296.32 F33.1     Past Medical History:  Past Medical History  Diagnosis Date  . Thyroid disease   . Fracture     ankle  . Heart murmur   . Hypothyroid   . Anxiety     Past Surgical History  Procedure Laterality Date  . Abdominal surgery      tummy tuck   Family History:  Family History  Problem Relation Age of Onset  . Hypertension Mother   . Alcohol abuse Father   . Anxiety disorder Sister   . Prostate cancer Brother   . Hypertension Sister    Social History:  Social History   Social History  . Marital Status: Married    Spouse Name: N/A  . Number of Children: N/A  . Years of Education: N/A   Social History Main Topics  . Smoking status: Never Smoker   . Smokeless tobacco: Never Used  . Alcohol Use: 0.6 oz/week    0  Standard drinks or equivalent, 0 Glasses of wine, 1 Cans of beer, 0 Shots of liquor per week  . Drug Use: No  . Sexual Activity: Yes   Other Topics Concern  . None   Social History Narrative   Additional History: Married and lives with husband.   Assessment:   Musculoskeletal: Strength & Muscle Tone: within normal limits Gait & Station: normal Patient leans: N/A  Psychiatric Specialty Exam: Anxiety Symptoms include nervous/anxious behavior.    Depression        Past medical history includes anxiety.     Review of Systems  Musculoskeletal: Positive for back pain.  Psychiatric/Behavioral: Positive for depression. The patient is nervous/anxious.   All other systems reviewed and are negative.   Blood pressure 124/86, pulse 68, temperature 97.4 F (36.3 C), temperature source Tympanic, height 5\' 3"  (1.6 m), weight 183 lb 9.6 oz (83.28 kg), SpO2 94 %.Body mass index is 32.53 kg/(m^2).  General Appearance: Casual  Eye Contact:  Fair  Speech:  Clear and Coherent and Normal Rate  Volume:  Normal  Mood:  Euthymic  Affect:  Congruent  Thought Process:  Goal Directed  Orientation:  Full (Time, Place, and Person)  Thought Content:  WDL  Suicidal Thoughts:  No  Homicidal Thoughts:  No  Memory:  Immediate;   Fair  Judgement:  Fair  Insight:  Fair  Psychomotor Activity:  Normal  Concentration:  Fair  Recall:  [employer] of Knowledge: Fair  Language: Fair  Akathisia:  No  Handed:  Right  AIMS (if indicated):    Assets:  Communication Skills Desire for Improvement Physical Health Social Support  ADL's:  Intact  Cognition: WNL  Sleep:     Is the patient at risk to self?  No. Has the patient been a risk to self in the past 6 months?  No. Has the patient been a risk to self within the distant past?  No. Is the patient a risk to others?  No. Has the patient been a risk to others in the past 6 months?  No. Has the patient been a risk to others within the distant past?   No.  Current Medications: Current Outpatient Prescriptions  Medication Sig Dispense Refill  . ALPRAZolam (XANAX) 0.5 MG tablet Take 1 tablet (0.5 mg total) by mouth daily as needed for anxiety. 30 tablet 1  . baclofen (LIORESAL) 10 MG tablet Limit 1 tablet by mouth per day or twice per day if tolerated   NO ZANAFLEX ( tizanidine ) 60 each 0  . DULoxetine (CYMBALTA) 60 MG capsule Take 1 capsule (60 mg total) by mouth daily. 90 capsule 2  . levothyroxine (SYNTHROID, LEVOTHROID) 88 MCG tablet Take 88 mcg by mouth daily.    Marland Kitchen LIVALO 2 MG TABS     . multivitamin (METANX) 3-35-2 MG TABS tablet Limit 1-2 tablets by mouth per day if tolerated 60 tablet 0  . Oxycodone HCl 10 MG TABS Limit 1/2-1 tablet by mouth 2 - 4  times a day if tolerated 110 tablet 0  . zolpidem (AMBIEN) 5 MG tablet Take 5 mg by mouth at bedtime as needed. for sleep  2   No current facility-administered medications for this visit.    Medical Decision Making:  Review of Psycho-Social Stressors (1) and Review and summation of old records (2)  Treatment Plan Summary:Medication management   Depression  Continue Cymbalta 60 mg by mouth daily . She would like to have the dose of the medication decrease at her next appointment.  Patient takes Xanax on a when necessary basis it was refilled for the next 2 months.  Insomnia She will continue on zolpidem 5 mg by mouth daily at bedtime when necessary for insomnia.    More than 50% of the time spent in psychoeducation, counseling and coordination of care.    This note was generated in part or whole with voice recognition software. Voice regonition is usually quite accurate but there are transcription errors that can and very often do occur. I apologize for any typographical errors that were not detected and corrected.   Rainey Pines, MD  05/25/2015, 1:06 PM

## 2015-05-26 ENCOUNTER — Other Ambulatory Visit: Payer: Self-pay | Admitting: Psychiatry

## 2015-05-28 ENCOUNTER — Telehealth: Payer: Self-pay

## 2015-05-28 NOTE — Telephone Encounter (Signed)
received request for refill on trazodone 100mg  take 1 tablet by mouth at bedtime.

## 2015-06-07 NOTE — Telephone Encounter (Signed)
D/cTrazodone

## 2015-06-08 ENCOUNTER — Ambulatory Visit: Payer: Commercial Managed Care - HMO | Attending: Pain Medicine | Admitting: Pain Medicine

## 2015-06-08 ENCOUNTER — Encounter: Payer: Self-pay | Admitting: Pain Medicine

## 2015-06-08 VITALS — BP 131/73 | HR 64 | Temp 98.1°F | Resp 15 | Ht 63.0 in | Wt 180.0 lb

## 2015-06-08 DIAGNOSIS — Z9889 Other specified postprocedural states: Secondary | ICD-10-CM | POA: Insufficient documentation

## 2015-06-08 DIAGNOSIS — M5010 Cervical disc disorder with radiculopathy, unspecified cervical region: Secondary | ICD-10-CM | POA: Diagnosis not present

## 2015-06-08 DIAGNOSIS — M5136 Other intervertebral disc degeneration, lumbar region: Secondary | ICD-10-CM | POA: Diagnosis not present

## 2015-06-08 DIAGNOSIS — G5600 Carpal tunnel syndrome, unspecified upper limb: Secondary | ICD-10-CM | POA: Diagnosis not present

## 2015-06-08 DIAGNOSIS — M533 Sacrococcygeal disorders, not elsewhere classified: Secondary | ICD-10-CM | POA: Insufficient documentation

## 2015-06-08 DIAGNOSIS — Z8781 Personal history of (healed) traumatic fracture: Secondary | ICD-10-CM | POA: Insufficient documentation

## 2015-06-08 DIAGNOSIS — M47816 Spondylosis without myelopathy or radiculopathy, lumbar region: Secondary | ICD-10-CM

## 2015-06-08 DIAGNOSIS — M25512 Pain in left shoulder: Secondary | ICD-10-CM | POA: Diagnosis present

## 2015-06-08 DIAGNOSIS — M25511 Pain in right shoulder: Secondary | ICD-10-CM | POA: Diagnosis present

## 2015-06-08 DIAGNOSIS — M542 Cervicalgia: Secondary | ICD-10-CM | POA: Diagnosis present

## 2015-06-08 DIAGNOSIS — G5603 Carpal tunnel syndrome, bilateral upper limbs: Secondary | ICD-10-CM

## 2015-06-08 DIAGNOSIS — M5481 Occipital neuralgia: Secondary | ICD-10-CM

## 2015-06-08 DIAGNOSIS — S82891A Other fracture of right lower leg, initial encounter for closed fracture: Secondary | ICD-10-CM

## 2015-06-08 MED ORDER — BACLOFEN 10 MG PO TABS
ORAL_TABLET | ORAL | Status: DC
Start: 2015-06-08 — End: 2017-10-15

## 2015-06-08 MED ORDER — OXYCODONE HCL 10 MG PO TABS: ORAL_TABLET | ORAL | Status: DC

## 2015-06-08 NOTE — Telephone Encounter (Signed)
pharmacy notified faxed and confirmed

## 2015-06-08 NOTE — Telephone Encounter (Signed)
Pt is already taking Zolpidem for insomina. Ask her to make appointment if she need additional insomnia meds.

## 2015-06-08 NOTE — Progress Notes (Signed)
Subjective:    Patient ID: Shannon Crawford, female    DOB: 06/10/1955, 60 y.o.   MRN: [id number]  HPI  The patient is a 60 year old female who returns to pain management for further evaluation and treatment of pain involving neck shoulders and upper limit is upper mid lower back and lower extremity region. The patient is tolerating baclofen and oxycodone well without significant complaints. We discussed patient's condition and will continue medications as prescribed at this time. The patient denies any trauma change in events of daily living call significant change in symptomatology. The patient has been able to engage in activities of daily living including hiking and is without significant exacerbation of pain. We will continue medications as prescribed this time and will consider patient for additional modification of treatment should they be significant change in condition. All agreed to suggested treatment plan.     Review of Systems     Objective:   Physical Exam  There was tenderness of the splenius capitis and a separate talus musculature region a mild degree with mild tenderness of the cervical facet cervical paraspinal musculature region. Palpation over the region of the acromioclavicular and glenohumeral joint regions reproduce mild discomfort as well. There was mild tenderness of the thoracic region thoracic facet region with no crepitus of the thoracic region noted. Palpation over the region of the lumbar paraspinal musculatures and lumbar facet region was with mild to moderate discomfort with lateral bending rotation extension and palpation of the lumbar facets reproducing mild to moderate discomfort. There was mild tenderness of the PSIS and PII S region with mild to moderate tenderness of the greater trochanteric region iliotibial band region. There was moderate increase of pain with pressure applied to the ileum with patient in lateral decubitus position. Straight leg raise was  tolerates approximately 30 without a definite increase of pain with dorsiflexion noted. Please note the patient is status post fracture of ankle with surgery of the ankle decreasing ability to perform dorsiflexion DTRs were difficult to elicit appeared to be trace at the knees. There was negative clonus negative Homans. Abdomen nontender with no costovertebral tenderness noted.    Assessment & Plan:      Degenerative disc disease cervical spine  Cervical radiculopathy  Cervical facet syndrome  Carpal tunnel syndrome  Thoracic outlet syndrome (will consider additional studies to evaluate for thoracic outlet syndrome)  Degenerative disc disease of the lumbar spine   Lumbar facet syndrome  Sacroiliac joint dysfunction     PLAN  Continue present medication oxycodone  and  baclofen   NO ZANAFLEX  . Baclofen is replacing Zanaflex. Continue vitamin B complex.   NO MOBIC OR SIMILAR NONSTEROIDAL ANTI-INFLAMMATORY  MEDICATIONS  F/U PCP Dr.N Welton Flakes   for evaliation of  BP arthritis and general medical  condition..   F/U surgical evaluation. May consider pending follow-up evaluations  F/U  Dr.Faheem as planned and as scheduled  May consider cervical MRI, PNCV EMG studies and other studies as discussed. We will delay at this time  Will consider MRI of knee as well as additional studies pending follow-up evaluation as previously discussed  F/U neurological evaluation. May consider PNCV EMG studies and other studies pending follow-up evaluations to further evaluate right lower extremity paresthesias  May consider radiofrequency rhizolysis or intraspinal procedures pending response to present treatment and F/U evaluation. We will avoid such treatment at this time   Patient to call Pain Management Center should patient have concerns prior to scheduled appointment

## 2015-06-08 NOTE — Patient Instructions (Addendum)
PLAN   Continue present medication oxycodone  and  baclofen   NO ZANAFLEX  . Baclofen is replacing Zanaflex. Continue vitamin B complex.   NO MOBIC OR SIMILAR NONSTEROIDAL ANTI-INFLAMMATORY  MEDICATIONS  F/U PCP Dr.N Humphrey Rolls   for evaliation of  BP arthritis and general medical  condition..   F/U surgical evaluation. May consider pending follow-up evaluations  F/U  Dr.Faheem as planned and as scheduled  May consider cervical MRI, PNCV EMG studies and other studies as discussed. We will delay at this time  Will consider MRI of knee as well as additional studies pending follow-up evaluation as previously discussed  F/U neurological evaluation. May consider PNCV EMG studies and other studies pending follow-up evaluations to further evaluate right lower extremity paresthesias  May consider radiofrequency rhizolysis or intraspinal procedures pending response to present treatment and F/U evaluation. We will avoid such treatment at this time   Patient to call Pain Management Center should patient have concerns prior to scheduled appointmentPain Management Discharge Instructions  General Discharge Instructions :  If you need to reach your doctor call: Monday-Friday 8:00 am - 4:00 pm at [phone] or toll free [phone].  After clinic hours [phone] to have operator reach doctor.  Bring all of your medication bottles to all your appointments in the pain clinic.  To cancel or reschedule your appointment with Pain Management please remember to call 24 hours in advance to avoid a fee.  Refer to the educational materials which you have been given on: General Risks, I had my Procedure. Discharge Instructions, Post Sedation.  Post Procedure Instructions:  The drugs you were given will stay in your system until tomorrow, so for the next 24 hours you should not drive, make any legal decisions or drink any alcoholic beverages.  You may eat anything you prefer, but it is better to start with  liquids then soups and crackers, and gradually work up to solid foods.  Please notify your doctor immediately if you have any unusual bleeding, trouble breathing or pain that is not related to your normal pain.  Depending on the type of procedure that was done, some parts of your body may feel week and/or numb.  This usually clears up by tonight or the next day.  Walk with the use of an assistive device or accompanied by an adult for the 24 hours.  You may use ice on the affected area for the first 24 hours.  Put ice in a Ziploc bag and cover with a towel and place against area 15 minutes on 15 minutes off.  You may switch to heat after 24 hours.

## 2015-06-08 NOTE — Progress Notes (Signed)
Safety precautions to be maintained throughout the outpatient stay will include: orient to surroundings, keep bed in low position, maintain call bell within reach at all times, provide assistance with transfer out of bed and ambulation.  

## 2015-06-24 ENCOUNTER — Ambulatory Visit (INDEPENDENT_AMBULATORY_CARE_PROVIDER_SITE_OTHER): Payer: 59 | Admitting: Psychiatry

## 2015-06-24 ENCOUNTER — Encounter: Payer: Self-pay | Admitting: Psychiatry

## 2015-06-24 VITALS — BP 142/100 | HR 73 | Temp 98.2°F | Ht 63.0 in | Wt 184.4 lb

## 2015-06-24 DIAGNOSIS — F331 Major depressive disorder, recurrent, moderate: Secondary | ICD-10-CM

## 2015-06-24 MED ORDER — BREXPIPRAZOLE 0.5 MG PO TABS: 0.5000 mg | ORAL_TABLET | Freq: Every day | ORAL | Status: DC
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
## 2015-07-08 ENCOUNTER — Ambulatory Visit: Payer: Commercial Managed Care - HMO | Attending: Pain Medicine | Admitting: Pain Medicine

## 2015-07-08 ENCOUNTER — Encounter: Payer: Self-pay | Admitting: Pain Medicine

## 2015-07-08 VITALS — BP 120/83 | HR 75 | Temp 98.3°F | Resp 16 | Ht 64.0 in | Wt 184.0 lb

## 2015-07-08 DIAGNOSIS — M5010 Cervical disc disorder with radiculopathy, unspecified cervical region: Secondary | ICD-10-CM | POA: Insufficient documentation

## 2015-07-08 DIAGNOSIS — M5481 Occipital neuralgia: Secondary | ICD-10-CM

## 2015-07-08 DIAGNOSIS — M5136 Other intervertebral disc degeneration, lumbar region: Secondary | ICD-10-CM | POA: Diagnosis not present

## 2015-07-08 DIAGNOSIS — M7060 Trochanteric bursitis, unspecified hip: Secondary | ICD-10-CM | POA: Insufficient documentation

## 2015-07-08 DIAGNOSIS — S82891A Other fracture of right lower leg, initial encounter for closed fracture: Secondary | ICD-10-CM

## 2015-07-08 DIAGNOSIS — M533 Sacrococcygeal disorders, not elsewhere classified: Secondary | ICD-10-CM

## 2015-07-08 DIAGNOSIS — G540 Brachial plexus disorders: Secondary | ICD-10-CM | POA: Insufficient documentation

## 2015-07-08 DIAGNOSIS — G5600 Carpal tunnel syndrome, unspecified upper limb: Secondary | ICD-10-CM | POA: Insufficient documentation

## 2015-07-08 DIAGNOSIS — R51 Headache: Secondary | ICD-10-CM | POA: Diagnosis present

## 2015-07-08 DIAGNOSIS — M47816 Spondylosis without myelopathy or radiculopathy, lumbar region: Secondary | ICD-10-CM

## 2015-07-08 DIAGNOSIS — G5603 Carpal tunnel syndrome, bilateral upper limbs: Secondary | ICD-10-CM

## 2015-07-08 DIAGNOSIS — M542 Cervicalgia: Secondary | ICD-10-CM | POA: Diagnosis present

## 2015-07-08 MED ORDER — OXYCODONE HCL 10 MG PO TABS: ORAL_TABLET | ORAL | Status: DC

## 2015-07-08 NOTE — Patient Instructions (Signed)
PLAN   Continue present medication oxycodone  and  baclofen   NO ZANAFLEX   Continue vitamin B complex.   NO MOBIC OR SIMILAR NONSTEROIDAL ANTI-INFLAMMATORY  MEDICATIONS  F/U PCP Dr.N Humphrey Rolls   for evaliation of  BP arthritis and general medical  condition..   F/U surgical evaluation. May consider pending follow-up evaluations  F/U  Dr.Faheem as planned and as scheduled  May consider cervical MRI, lumbar MRI [facility] EMG studies and other studies as discussed. We will delay at this time  Will consider MRI of knee as well as additional studies pending follow-up evaluation as previously discussed  F/U neurological evaluation. May consider PNCV EMG studies and other studies pending follow-up evaluations to further evaluate right lower extremity paresthesias as previously discussed  May consider radiofrequency rhizolysis or intraspinal procedures pending response to present treatment and F/U evaluation. We will avoid such treatment at this time   Patient to call Pain Management Center should patient have concerns prior to scheduled appointment

## 2015-07-08 NOTE — Progress Notes (Signed)
Subjective:    Patient ID: Shannon Crawford, female    DOB: 10/30/1955, 60 y.o.   MRN: [id number]  HPI  The patient is a 60 year old female who returns to pain management Center for further evaluation and treatment of pain involving the neck headaches upper extremities lower extremities. The patient stated that she recently fell when she was carrying acromial that she was making return trip over her husband shoes. The patient landed in the region of the right gluteal region. The patient has had some exacerbation of pain involving the lower back lower extremity region on the right. We discussed medications and patient will continue presently prescribed medications. We remain available to consider patient for interventional treatment should the symptoms persist despite noninterventional treatment measures. The patient was with understanding and agreed to suggested treatment plan     Review of Systems     Objective:   Physical Exam   There was tenderness to palpation of the splenius capitis and occipitalis region a mild to moderate degree with mild to moderate tenderness of the cervical facet cervical paraspinal must reason as well as the thoracic facet thoracic paraspinal musculature region. Palpation of the acromial clavicular and glenohumeral joint region with mild discomfort and patient appeared to be with mild increased pain with Tinel and Phalen's maneuver if grip strength appeared to be slightly decreased. There was tenderness over the thoracic facet thoracic paraspinal muscular treat for mild to moderate degree with no crepitus of the thoracic region noted. The patient appeared to be with unremarkable Spurling's maneuver. Palpation over the lumbar paraspinal must reason lumbar facet region was attends to palpation of moderate degree with lateral bending rotation extension and palpation of lumbar facets reproducing moderate discomfort. Straight leg raise was tolerates approximately 30 with  questionable decreased EHL strength on the right compared to the left with moderate increase of pain with palpation over the gluteal and piriformis musculature regions. There was negative clonus negative Homans. There was mild tenderness over the PSIS and PII S region and patient was without increased pain of significant degree with Patrick's maneuver. Abdomen was nontender with no costovertebral length is noted     Assessment & Plan:    Degenerative disc disease cervical spine  Cervical radiculopathy  Cervical facet syndrome  Carpal tunnel syndrome  Thoracic outlet syndrome (will consider additional studies to evaluate for thoracic outlet syndrome)  Degenerative disc disease of the lumbar spine   Lumbar facet syndrome  Piriformis syndrome  Greater trochanteric bursitis        PLAN   Continue present medication oxycodone  and  baclofen   NO ZANAFLEX   Continue vitamin B complex.   NO MOBIC OR SIMILAR NONSTEROIDAL ANTI-INFLAMMATORY  MEDICATIONS  F/U PCP Dr.N Welton Flakes   for evaliation of  BP arthritis and general medical  condition..   F/U surgical evaluation. May consider pending follow-up evaluations  F/U  Dr.Faheem as planned and as scheduled  May consider cervical MRI, lumbar MRI PN[facility] EMG studies and other studies as discussed. We will delay at this time  Will consider MRI of knee as well as additional studies pending follow-up evaluation as previously discussed  F/U neurological evaluation. May consider PN[facility] EMG studies and other studies pending follow-up evaluations to further evaluate right lower extremity paresthesias as previously discussed  May consider radiofrequency rhizolysis or intraspinal procedures pending response to present treatment and F/U evaluation. We will avoid such treatment at this time   Patient to call Pain Management Center should patient  have concerns prior to scheduled appointment

## 2015-07-08 NOTE — Progress Notes (Signed)
Safety precautions to be maintained throughout the outpatient stay will include: orient to surroundings, keep bed in low position, maintain call bell within reach at all times, provide assistance with transfer out of bed and ambulation.  

## 2015-07-16 LAB — TOXASSURE SELECT 13 (MW), URINE

## 2015-07-16 NOTE — Progress Notes (Signed)
Quick Note:  Reviewed. ______ 

## 2015-07-22 ENCOUNTER — Encounter: Payer: Self-pay | Admitting: Psychiatry

## 2015-07-22 ENCOUNTER — Ambulatory Visit (INDEPENDENT_AMBULATORY_CARE_PROVIDER_SITE_OTHER): Payer: 59 | Admitting: Psychiatry

## 2015-07-22 VITALS — BP 142/90 | HR 83 | Temp 97.5°F | Ht 64.0 in | Wt 185.8 lb

## 2015-07-22 DIAGNOSIS — F331 Major depressive disorder, recurrent, moderate: Secondary | ICD-10-CM | POA: Diagnosis not present

## 2015-07-22 MED ORDER — ALPRAZOLAM 0.5 MG PO TABS
0.5000 mg | ORAL_TABLET | Freq: Every day | ORAL | Status: DC | PRN
Start: 2015-07-22 — End: 2015-10-05

## 2015-07-22 NOTE — Progress Notes (Signed)
[facility] MD/PA/NP OP Progress Note  07/22/2015 2:02 PM Shannon Crawford  MRN:  [id number]  Subjective:   Pt  is a 60 year old married female  who presented for the follow-up appointment. She reported that she has started noticing improvement since her medications have been adjusted. Patient reported that she is becoming more calm and her depressive symptoms haven't been improving.. She reported that she becoming more involved in her quilt and has been making quilts for her family members including her daughters. She is also planning to travel to [place] can take a to visit her in-laws. Patient reported that she is not taking zolpidem on a regular basis to help her sleep. She has been taking Xanax as prescribed. She currently denied having any suicidal homicidal ideations or plans.  Chief Complaint:  Chief Complaint    Follow-up; Medication Refill     Visit Diagnosis:     ICD-9-CM ICD-10-CM   1. MDD (major depressive disorder), recurrent episode, moderate ([place]) 296.32 F33.1     Past Medical History:  Past Medical History  Diagnosis Date  . Thyroid disease   . Fracture     ankle  . Heart murmur   . Hypothyroid   . Anxiety     Past Surgical History  Procedure Laterality Date  . Abdominal surgery      tummy tuck   Family History:  Family History  Problem Relation Age of Onset  . Hypertension Mother   . Alcohol abuse Father   . Anxiety disorder Sister   . Prostate cancer Brother   . Hypertension Sister    Social History:  Social History   Social History  . Marital Status: Married    Spouse Name: N/A  . Number of Children: N/A  . Years of Education: N/A   Social History Main Topics  . Smoking status: Never Smoker   . Smokeless tobacco: Never Used  . Alcohol Use: 0.6 oz/week    0 Standard drinks or equivalent, 0 Glasses of wine, 1 Cans of beer, 0 Shots of liquor per week  . Drug Use: No  . Sexual Activity: Yes   Other Topics Concern  . None   Social History Narrative    Additional History: Married and lives with husband.   Assessment:   Musculoskeletal: Strength & Muscle Tone: within normal limits Gait & Station: normal Patient leans: N/A  Psychiatric Specialty Exam: Anxiety Symptoms include insomnia and nervous/anxious behavior.    Depression        Associated symptoms include insomnia.  Past medical history includes anxiety.     Review of Systems  Musculoskeletal: Positive for back pain.  Psychiatric/Behavioral: Positive for depression. The patient is nervous/anxious and has insomnia.   All other systems reviewed and are negative.   Blood pressure 142/90, pulse 83, temperature 97.5 F (36.4 C), temperature source Tympanic, height 5\' 4"  (1.626 m), weight 185 lb 12.8 oz (84.278 kg), SpO2 92 %.Body mass index is 31.88 kg/(m^2).  General Appearance: Casual  Eye Contact:  Fair  Speech:  Clear and Coherent and Normal Rate  Volume:  Normal  Mood:  Euthymic  Affect:  Congruent  Thought Process:  Goal Directed  Orientation:  Full (Time, Place, and Person)  Thought Content:  WDL  Suicidal Thoughts:  No  Homicidal Thoughts:  No  Memory:  Immediate;   Fair  Judgement:  Fair  Insight:  Fair  Psychomotor Activity:  Normal  Concentration:  Fair  Recall:  [employer] of Knowledge: Fair  Language: Fair  Akathisia:  No  Handed:  Right  AIMS (if indicated):    Assets:  Communication Skills Desire for Improvement Physical Health Social Support  ADL's:  Intact  Cognition: WNL  Sleep:     Is the patient at risk to self?  No. Has the patient been a risk to self in the past 6 months?  No. Has the patient been a risk to self within the distant past?  No. Is the patient a risk to others?  No. Has the patient been a risk to others in the past 6 months?  No. Has the patient been a risk to others within the distant past?  No.  Current Medications: Current Outpatient Prescriptions  Medication Sig Dispense Refill  . ALPRAZolam (XANAX) 0.5 MG  tablet Take 1 tablet (0.5 mg total) by mouth daily as needed for anxiety. 30 tablet 1  . b complex vitamins tablet Take 1 tablet by mouth daily.    . baclofen (LIORESAL) 10 MG tablet Limit 1 tablet by mouth per day or twice per day if tolerated   NO ZANAFLEX ( tizanidine ) 60 each 0  . Brexpiprazole (REXULTI) 0.5 MG TABS Take 0.5 mg by mouth daily after breakfast. Samples given 30 tablet 0  . DULoxetine (CYMBALTA) 60 MG capsule Take 1 capsule (60 mg total) by mouth daily. 90 capsule 2  . folic acid (FOLVITE) Q000111Q MCG tablet Take 400 mcg by mouth daily.    Marland Kitchen levothyroxine (SYNTHROID, LEVOTHROID) 88 MCG tablet Take 88 mcg by mouth daily.    Marland Kitchen LIVALO 2 MG TABS     . multivitamin (METANX) 3-35-2 MG TABS tablet Limit 1-2 tablets by mouth per day if tolerated 60 tablet 0  . Oxycodone HCl 10 MG TABS Limit 1/2-1 tablet by mouth 2 - 4  times a day if tolerated 110 tablet 0  . zolpidem (AMBIEN) 5 MG tablet Take 1 tablet (5 mg total) by mouth at bedtime as needed. for sleep 30 tablet 0   No current facility-administered medications for this visit.    Medical Decision Making:  Review of Psycho-Social Stressors (1) and Review and summation of old records (2)  Treatment Plan Summary:Medication management   Depression  Continue Cymbalta 60 mg by mouth daily .  Continue Rexulti 0.5 milligrams daily to help with her depressive symptoms Patient has just refilled her medications. Patient takes Xanax on a when necessary basis it was refilled for the next 2 months.  Insomnia She will continue on zolpidem 5 mg by mouth daily at bedtime when necessary for insomnia. Patient is not taking zolpidem on a regular basis and it was not refilled at this time  She will follow-up in one month or earlier depending on her symptoms   More than 50% of the time spent in psychoeducation, counseling and coordination of care.    This note was generated in part or whole with voice recognition software. Voice regonition is  usually quite accurate but there are transcription errors that can and very often do occur. I apologize for any typographical errors that were not detected and corrected.   Rainey Pines, MD  07/22/2015, 2:02 PM

## 2015-08-10 ENCOUNTER — Ambulatory Visit: Payer: Commercial Managed Care - HMO | Attending: Pain Medicine | Admitting: Pain Medicine

## 2015-08-10 ENCOUNTER — Encounter: Payer: Self-pay | Admitting: Pain Medicine

## 2015-08-10 VITALS — BP 129/75 | HR 79 | Temp 98.1°F | Resp 16 | Ht 63.5 in | Wt 185.0 lb

## 2015-08-10 DIAGNOSIS — M5136 Other intervertebral disc degeneration, lumbar region: Secondary | ICD-10-CM | POA: Diagnosis not present

## 2015-08-10 DIAGNOSIS — M5010 Cervical disc disorder with radiculopathy, unspecified cervical region: Secondary | ICD-10-CM | POA: Diagnosis not present

## 2015-08-10 DIAGNOSIS — M47816 Spondylosis without myelopathy or radiculopathy, lumbar region: Secondary | ICD-10-CM

## 2015-08-10 DIAGNOSIS — G5600 Carpal tunnel syndrome, unspecified upper limb: Secondary | ICD-10-CM | POA: Diagnosis not present

## 2015-08-10 DIAGNOSIS — M7060 Trochanteric bursitis, unspecified hip: Secondary | ICD-10-CM | POA: Insufficient documentation

## 2015-08-10 DIAGNOSIS — M533 Sacrococcygeal disorders, not elsewhere classified: Secondary | ICD-10-CM

## 2015-08-10 DIAGNOSIS — G5603 Carpal tunnel syndrome, bilateral upper limbs: Secondary | ICD-10-CM

## 2015-08-10 DIAGNOSIS — Z8781 Personal history of (healed) traumatic fracture: Secondary | ICD-10-CM | POA: Insufficient documentation

## 2015-08-10 DIAGNOSIS — G540 Brachial plexus disorders: Secondary | ICD-10-CM | POA: Diagnosis not present

## 2015-08-10 DIAGNOSIS — S82891A Other fracture of right lower leg, initial encounter for closed fracture: Secondary | ICD-10-CM

## 2015-08-10 DIAGNOSIS — M542 Cervicalgia: Secondary | ICD-10-CM | POA: Diagnosis present

## 2015-08-10 DIAGNOSIS — M5481 Occipital neuralgia: Secondary | ICD-10-CM

## 2015-08-10 MED ORDER — OXYCODONE HCL 10 MG PO TABS: ORAL_TABLET | ORAL | Status: DC

## 2015-08-10 NOTE — Progress Notes (Signed)
Safety precautions to be maintained throughout the outpatient stay will include: orient to surroundings, keep bed in low position, maintain call bell within reach at all times, provide assistance with transfer out of bed and ambulation.  

## 2015-08-10 NOTE — Patient Instructions (Signed)
PLAN   Continue present medication oxycodone  and  baclofen   NO ZANAFLEX   Continue vitamin B complex.   NO MOBIC OR SIMILAR NONSTEROIDAL ANTI-INFLAMMATORY  MEDICATIONS as previously discussed  F/U PCP Dr.N Humphrey Rolls   for evaliation of  BP arthritis and general medical  condition..   F/U surgical evaluation. May consider pending follow-up evaluations  F/U  Dr.Faheem as planned and as scheduled  May consider cervical MRI, lumbar MRI [facility] EMG studies and other studies as discussed. We will delay at this time as previously discussed  Will consider MRI of knee as well as additional studies pending follow-up evaluation as previously discussed  F/U neurological evaluation. May consider PN[facility] EMG studies and other studies pending follow-up evaluations to further evaluate right lower extremity paresthesias . We will avoid such studies at this time  May consider radiofrequency rhizolysis or intraspinal procedures pending response to present treatment and F/U evaluation. We will avoid such treatment at this time   Patient to call Pain Management Center should patient have concerns prior to scheduled appointment

## 2015-08-10 NOTE — Progress Notes (Signed)
Subjective:    Patient ID: Shannon Crawford, female    DOB: [birth year], 60 y.o.   MRN: [id number]  HPI  The patient is a 60 year old female who returns to pain management for further evaluation and treatment of pain involving the region of the neck upper extremity regions mid back lower back lower extremity region. At the present time patient states that she continues to have some pain involving the region of the buttocks. The patient fell and landed on her buttocks in the past. We previously discussed patient's fall at time of patient's previous visit.. The patient has slight decreased range of motion of the lower extremity and states that the pain is aggravated by sitting. We discussed additional interventional treatment which we will avoid at this time. Patient will continue medications consisting of oxycodone and Zanaflex. We will avoid Mobic as discussed with patient the patient denies any other trauma change in events of daily living the call significant change in symptomatology. The patient will call pain management prior to scheduled return appointment should there be significant change in patient's condition. All agreed to suggested treatment plan. We will continue present medications as discussed.  Review of Systems     Objective:   Physical Exam  There was tenderness over the splenius capitis and occipitalis regions of mild degree with mild tenderness over the cervical facet cervical paraspinal musculature region. There was tenderness to palpation of the acromioclavicular and glenohumeral joint regions of mild degree and patient appeared to be with unremarkable drop test. Tinel and Phalen's maneuver reproduce moderate discomfort. There was slightly decreased grip strength. Palpation over the thoracic region thoracic facet region was tenderness to palpation of moderate degree in the lower thoracic region without crepitus of the thoracic region noted. Palpation over the lumbar paraspinal  musculatures and lumbar facet region was with some increased pain noted with extension and lateral bending rotation and palpation over the lumbar facet region. Palpation of the PSIS and PII S region reproduces moderate discomfort with moderate to moderately severe tenderness of the gluteal and piriformis musculature region. Straight leg raise was tolerates approximately 30. There was decreased EHL strength. Dorsiflexion was limited area patient is status post fracture of ankle. No definite sensory deficit of dermatomal distribution detected. DTRs appeared to be trace at the knees and there was negative clonus negative Homans. Abdomen nontender with no costovertebral angle tenderness noted      Assessment & Plan:       Degenerative disc disease cervical spine  Cervical radiculopathy  Cervical facet syndrome  Carpal tunnel syndrome  Thoracic outlet syndrome (will consider additional studies to evaluate for thoracic outlet syndrome)  Degenerative disc disease of the lumbar spine   Lumbar facet syndrome  Piriformis syndrome  Greater trochanteric bursitis      PLAN   Continue present medication oxycodone  and  baclofen   NO ZANAFLEX   Continue vitamin B complex.   NO MOBIC OR SIMILAR NONSTEROIDAL ANTI-INFLAMMATORY  MEDICATIONS as previously discussed  F/U PCP Dr.N Welton Flakes   for evaliation of  BP arthritis and general medical  condition..   F/U surgical evaluation. May consider pending follow-up evaluations  F/U  Dr.Faheem as planned and as scheduled  May consider cervical MRI, lumbar MRI [facility] EMG studies and other studies as discussed. We will delay at this time as previously discussed  Will consider MRI of knee as well as additional studies pending follow-up evaluation as previously discussed  F/U neurological evaluation. May consider PNCV EMG studies  and other studies pending follow-up evaluations to further evaluate right lower extremity paresthesias . We will avoid such  studies at this time  May consider radiofrequency rhizolysis or intraspinal procedures pending response to present treatment and F/U evaluation. We will avoid such treatment at this time   Patient to call Pain Management Center should patient have concerns prior to scheduled appointment

## 2015-08-17 ENCOUNTER — Ambulatory Visit: Payer: 59 | Admitting: Psychiatry

## 2015-08-19 ENCOUNTER — Ambulatory Visit: Payer: 59 | Admitting: Psychiatry

## 2015-08-31 ENCOUNTER — Other Ambulatory Visit: Payer: Self-pay | Admitting: Psychiatry

## 2015-09-02 ENCOUNTER — Ambulatory Visit (INDEPENDENT_AMBULATORY_CARE_PROVIDER_SITE_OTHER): Payer: 59 | Admitting: Psychiatry

## 2015-09-02 DIAGNOSIS — F331 Major depressive disorder, recurrent, moderate: Secondary | ICD-10-CM

## 2015-09-02 MED ORDER — DULOXETINE HCL 60 MG PO CPEP: 60.0000 mg | ORAL_CAPSULE | Freq: Every day | ORAL | Status: DC

## 2015-09-02 MED ORDER — BREXPIPRAZOLE 0.5 MG PO TABS: 0.5000 mg | ORAL_TABLET | Freq: Every day | ORAL | Status: DC

## 2015-09-02 MED ORDER — TOPIRAMATE 50 MG PO TABS: 50.0000 mg | ORAL_TABLET | Freq: Every day | ORAL | Status: DC

## 2015-09-02 MED ORDER — ZOLPIDEM TARTRATE 5 MG PO TABS: 5.0000 mg | ORAL_TABLET | Freq: Every evening | ORAL | Status: DC | PRN

## 2015-09-02 NOTE — Progress Notes (Signed)
BH MD/PA/NP OP Progress Note  09/02/2015 1:26 PM Shannon Crawford  MRN:  [id number]  Subjective:   Pt  is a 60 year old married female  who presented for the follow-up appointment. She was upset due to the recent trip with her sisters at the beach. She was discussing at length about the behavior of her sisters as  reported that one of them as one of them has  personality disorder. She reported that she was calm and cooperative with them. However they continue  to have a bad attitude with her. She reported that she has been compliant with her medications. She currently denied having any suicidal ideations or plans. She reported that she has gained some weight on her medications and she wants to have the medications adjusted. She denied having any perceptual disturbances. She denied having any suicidal homicidal ideations or plans.Patient reported that she is not taking zolpidem on a regular basis to help her sleep. She has been taking Xanax as prescribed.  Chief Complaint:   Visit Diagnosis:     ICD-9-CM ICD-10-CM   1. MDD (major depressive disorder), recurrent episode, moderate ([place]) 296.32 F33.1     Past Medical History:  Past Medical History  Diagnosis Date  . Thyroid disease   . Fracture     ankle  . Heart murmur   . Hypothyroid   . Anxiety     Past Surgical History  Procedure Laterality Date  . Abdominal surgery      tummy tuck   Family History:  Family History  Problem Relation Age of Onset  . Hypertension Mother   . Alcohol abuse Father   . Anxiety disorder Sister   . Prostate cancer Brother   . Hypertension Sister    Social History:  Social History   Social History  . Marital Status: Married    Spouse Name: N/A  . Number of Children: N/A  . Years of Education: N/A   Social History Main Topics  . Smoking status: Never Smoker   . Smokeless tobacco: Never Used  . Alcohol Use: 0.6 oz/week    0 Standard drinks or equivalent, 0 Glasses of wine, 1 Cans of beer, 0 Shots  of liquor per week  . Drug Use: No  . Sexual Activity: Yes   Other Topics Concern  . Not on file   Social History Narrative   Additional History: Married and lives with husband.   Assessment:   Musculoskeletal: Strength & Muscle Tone: within normal limits Gait & Station: normal Patient leans: N/A  Psychiatric Specialty Exam: Anxiety Symptoms include insomnia and nervous/anxious behavior.    Depression        Associated symptoms include insomnia.  Past medical history includes anxiety.     Review of Systems  Musculoskeletal: Positive for back pain.  Psychiatric/Behavioral: Positive for depression. The patient is nervous/anxious and has insomnia.   All other systems reviewed and are negative.   There were no vitals taken for this visit.There is no weight on file to calculate BMI.  General Appearance: Casual  Eye Contact:  Fair  Speech:  Clear and Coherent and Normal Rate  Volume:  Normal  Mood:  Euthymic  Affect:  Congruent  Thought Process:  Goal Directed  Orientation:  Full (Time, Place, and Person)  Thought Content:  WDL  Suicidal Thoughts:  No  Homicidal Thoughts:  No  Memory:  Immediate;   Fair  Judgement:  Fair  Insight:  Fair  Psychomotor Activity:  Normal  Concentration:  Fair  Recall:  [employer] of Knowledge: Fair  Language: Fair  Akathisia:  No  Handed:  Right  AIMS (if indicated):    Assets:  Communication Skills Desire for Improvement Physical Health Social Support  ADL's:  Intact  Cognition: WNL  Sleep:     Is the patient at risk to self?  No. Has the patient been a risk to self in the past 6 months?  No. Has the patient been a risk to self within the distant past?  No. Is the patient a risk to others?  No. Has the patient been a risk to others in the past 6 months?  No. Has the patient been a risk to others within the distant past?  No.  Current Medications: Current Outpatient Prescriptions  Medication Sig Dispense Refill  .  ALPRAZolam (XANAX) 0.5 MG tablet Take 1 tablet (0.5 mg total) by mouth daily as needed for anxiety. 30 tablet 1  . b complex vitamins tablet Take 1 tablet by mouth daily.    . baclofen (LIORESAL) 10 MG tablet Limit 1 tablet by mouth per day or twice per day if tolerated   NO ZANAFLEX ( tizanidine ) 60 each 0  . Brexpiprazole (REXULTI) 0.5 MG TABS Take 0.5 mg by mouth daily after breakfast. Samples given 30 tablet 0  . DULoxetine (CYMBALTA) 60 MG capsule Take 1 capsule (60 mg total) by mouth daily. 90 capsule 2  . folic acid (FOLVITE) Q000111Q MCG tablet Take 400 mcg by mouth daily.    Marland Kitchen levothyroxine (SYNTHROID, LEVOTHROID) 88 MCG tablet Take 88 mcg by mouth daily.    Marland Kitchen LIVALO 2 MG TABS     . multivitamin (METANX) 3-35-2 MG TABS tablet Limit 1-2 tablets by mouth per day if tolerated 60 tablet 0  . Oxycodone HCl 10 MG TABS Limit 1/2-1 tablet by mouth 2 - 4  times a day if tolerated 110 tablet 0  . zolpidem (AMBIEN) 5 MG tablet Take 1 tablet (5 mg total) by mouth at bedtime as needed. for sleep 30 tablet 0   No current facility-administered medications for this visit.    Medical Decision Making:  Review of Psycho-Social Stressors (1) and Review and summation of old records (2)  Treatment Plan Summary:Medication management   Depression  Continue Cymbalta 60 mg by mouth daily .  Continue Rexulti 0.5 milligrams daily to help with her depressive symptoms- 90 day supply given.  Patient takes Xanax on a when necessary basis-  has supply.  Insomnia She will continue on zolpidem 5 mg by mouth daily at bedtime when necessary for insomnia. Started on Topamax 50 mg by mouth daily at bedtime to help with the weight gain. Discussed with her about the side effects in detail and she demonstrated understanding.  She will follow-up in one month or earlier depending on her symptoms   More than 50% of the time spent in psychoeducation, counseling and coordination of care.    This note was generated in part  or whole with voice recognition software. Voice regonition is usually quite accurate but there are transcription errors that can and very often do occur. I apologize for any typographical errors that were not detected and corrected.   Rainey Pines, MD  09/02/2015, 1:26 PM

## 2015-09-07 ENCOUNTER — Ambulatory Visit: Payer: Commercial Managed Care - HMO | Attending: Pain Medicine | Admitting: Pain Medicine

## 2015-09-07 ENCOUNTER — Encounter: Payer: Self-pay | Admitting: Pain Medicine

## 2015-09-07 VITALS — BP 125/81 | HR 73 | Temp 98.4°F | Resp 18 | Ht 63.0 in | Wt 185.0 lb

## 2015-09-07 DIAGNOSIS — G5600 Carpal tunnel syndrome, unspecified upper limb: Secondary | ICD-10-CM | POA: Diagnosis not present

## 2015-09-07 DIAGNOSIS — M542 Cervicalgia: Secondary | ICD-10-CM | POA: Diagnosis present

## 2015-09-07 DIAGNOSIS — S82891A Other fracture of right lower leg, initial encounter for closed fracture: Secondary | ICD-10-CM

## 2015-09-07 DIAGNOSIS — M5136 Other intervertebral disc degeneration, lumbar region: Secondary | ICD-10-CM | POA: Diagnosis not present

## 2015-09-07 DIAGNOSIS — M5010 Cervical disc disorder with radiculopathy, unspecified cervical region: Secondary | ICD-10-CM | POA: Insufficient documentation

## 2015-09-07 DIAGNOSIS — M47816 Spondylosis without myelopathy or radiculopathy, lumbar region: Secondary | ICD-10-CM

## 2015-09-07 DIAGNOSIS — M533 Sacrococcygeal disorders, not elsewhere classified: Secondary | ICD-10-CM

## 2015-09-07 DIAGNOSIS — M25512 Pain in left shoulder: Secondary | ICD-10-CM | POA: Diagnosis present

## 2015-09-07 DIAGNOSIS — M5481 Occipital neuralgia: Secondary | ICD-10-CM

## 2015-09-07 DIAGNOSIS — M25511 Pain in right shoulder: Secondary | ICD-10-CM | POA: Diagnosis present

## 2015-09-07 DIAGNOSIS — G5603 Carpal tunnel syndrome, bilateral upper limbs: Secondary | ICD-10-CM

## 2015-09-07 MED ORDER — OXYCODONE HCL 10 MG PO TABS: ORAL_TABLET | ORAL | Status: DC

## 2015-09-07 NOTE — Progress Notes (Signed)
Subjective:    Patient ID: Shannon Crawford, female    DOB: 19-Nov-1955, 60 y.o.   MRN: [id number]  HPI  The patient is a 60 year old female who returns to pain management for further evaluation and treatment of pain involving the neck shoulders wrist upper extremity regions mid back lower back lower extremity region and ankles hips. The patient states the present time she is continuing oxycodone and baclofen without the use of Mobic or Zanaflex. We discussed patient's condition. We will continue presently prescribed medications. We will avoid interventional treatment. Patient will call pain management should they be exacerbation of her symptoms. Patient has been able to make a beautiful quilt for one of her friends and is able to perform most activities of daily living without severely disabling pain. We will continue present treatment regimen at this time. All agreed to suggested treatment plan  Review of Systems     Objective:   Physical Exam  There was tenderness of the splenius capitis and occipitalis region palpation which reproduces mild discomfort with mild tenderness of the cervical and thoracic facet region as well as the acromioclavicular and glenohumeral joint region. Patient was with bilaterally equal grip strength without increase of pain with Tinel and Phalen's maneuver and there was negative drop test and negative Spurling's maneuver. Palpation over the thoracic region was with no crepitus of the thoracic region noted. Palpation over the lumbar region was attends to palpation of moderate degree with lateral bending rotation extension and palpation of the lumbar facets reproducing moderate discomfort with mild to moderate tenderness over the PSIS and PII S region a moderate to moderately severe tenderness of the gluteal and piriformis musculature region. Straight leg raising was tolerated to 30 without increased pain with dorsiflexion noted. EHL strength was decreased. DTRs appeared to be  trace at the knees. There was negative clonus negative Homans. Abdomen nontender with no costovertebral tenderness noted      Assessment & Plan:          Degenerative disc disease cervical spine  Cervical radiculopathy  Cervical facet syndrome  Carpal tunnel syndrome  Thoracic outlet syndrome (will consider additional studies to evaluate for thoracic outlet syndrome)  Degenerative disc disease of the lumbar spine   Lumbar facet syndrome  Piriformis syndrome       PLAN   Continue present medication oxycodone  and  baclofen   NO ZANAFLEX   Continue vitamin B complex.   NO MOBIC OR SIMILAR NONSTEROIDAL ANTI-INFLAMMATORY  MEDICATIONS as previously discussed  F/U PCP Dr.N Welton Flakes   for evaliation of  BP arthritis and general medical  condition..   F/U surgical evaluation. May consider pending follow-up evaluations  F/U  Dr.Faheem as discussed  May consider cervical MRI, lumbar MRI [facility] EMG studies and other studies as discussed. We will delay at this time as previously discussed  Will consider MRI of knee as well as additional studies pending follow-up evaluation as previously discussed  F/U neurological evaluation. May consider PN[facility] EMG studies and other studies pending follow-up evaluations to further evaluate right lower extremity paresthesias . We will avoid such studies at this time  May consider radiofrequency rhizolysis or intraspinal procedures pending response to present treatment and F/U evaluation. We will avoid such treatment at this time   Patient to call [facility] Management Center should patient have concerns prior to scheduled appointment

## 2015-09-07 NOTE — Progress Notes (Signed)
Safety precautions to be maintained throughout the outpatient stay will include: orient to surroundings, keep bed in low position, maintain call bell within reach at all times, provide assistance with transfer out of bed and ambulation.  

## 2015-09-07 NOTE — Patient Instructions (Signed)
PLAN   Continue present medication oxycodone  and  baclofen   NO ZANAFLEX   Continue vitamin B complex.   NO MOBIC OR SIMILAR NONSTEROIDAL ANTI-INFLAMMATORY  MEDICATIONS as previously discussed  F/U PCP Dr.N Humphrey Rolls   for evaliation of  BP arthritis and general medical  condition..   F/U surgical evaluation. May consider pending follow-up evaluations  F/U  Dr.Faheem as discussed  May consider cervical MRI, lumbar MRI [facility] EMG studies and other studies as discussed. We will delay at this time as previously discussed  Will consider MRI of knee as well as additional studies pending follow-up evaluation as previously discussed  F/U neurological evaluation. May consider [facility] EMG studies and other studies pending follow-up evaluations to further evaluate right lower extremity paresthesias . We will avoid such studies at this time  May consider radiofrequency rhizolysis or intraspinal procedures pending response to present treatment and F/U evaluation. We will avoid such treatment at this time   Patient to call [facility] Management Center should patient have concerns prior to scheduled appointment

## 2015-09-16 ENCOUNTER — Other Ambulatory Visit: Payer: Self-pay | Admitting: Internal Medicine

## 2015-09-16 DIAGNOSIS — Z1231 Encounter for screening mammogram for malignant neoplasm of breast: Secondary | ICD-10-CM

## 2015-09-30 ENCOUNTER — Other Ambulatory Visit: Payer: Self-pay | Admitting: Psychiatry

## 2015-09-30 ENCOUNTER — Ambulatory Visit
Admission: RE | Admit: 2015-09-30 | Discharge: 2015-09-30 | Disposition: A | Discharge status: Home / Self Care | Payer: Commercial Managed Care - HMO | Source: Ambulatory Visit | Attending: Internal Medicine | Admitting: Internal Medicine

## 2015-09-30 DIAGNOSIS — Z1231 Encounter for screening mammogram for malignant neoplasm of breast: Secondary | ICD-10-CM | POA: Diagnosis not present

## 2015-10-05 ENCOUNTER — Encounter: Payer: Self-pay | Admitting: Psychiatry

## 2015-10-05 ENCOUNTER — Ambulatory Visit (INDEPENDENT_AMBULATORY_CARE_PROVIDER_SITE_OTHER): Payer: 59 | Admitting: Psychiatry

## 2015-10-05 VITALS — BP 132/77 | HR 68 | Temp 98.0°F | Ht 63.0 in | Wt 193.4 lb

## 2015-10-05 DIAGNOSIS — F411 Generalized anxiety disorder: Secondary | ICD-10-CM

## 2015-10-05 DIAGNOSIS — F331 Major depressive disorder, recurrent, moderate: Secondary | ICD-10-CM | POA: Diagnosis not present

## 2015-10-05 MED ORDER — ZOLPIDEM TARTRATE 5 MG PO TABS: 5.0000 mg | ORAL_TABLET | Freq: Every evening | ORAL | 1 refills | Status: DC | PRN

## 2015-10-05 MED ORDER — TOPIRAMATE 100 MG PO TABS: 100.0000 mg | ORAL_TABLET | Freq: Every day | ORAL | 1 refills | Status: DC

## 2015-10-05 MED ORDER — ALPRAZOLAM 0.5 MG PO TABS: 0.5000 mg | ORAL_TABLET | Freq: Every day | ORAL | 1 refills | Status: DC | PRN

## 2015-10-05 NOTE — Progress Notes (Signed)
BH MD/PA/NP OP Progress Note  10/05/2015 2:30 PM Shannon Crawford  MRN:  [id number]  Subjective:   Pt  is a 60 year old married female  who presented for the follow-up appointment. She brought weight as she wanted to show it to me. She was excited as she has been making for her brother-in-law. Patient reported that she has gained weight in the past couple of months. She has been eating ice cream with her husband on a regular basis as she reported that whenever she will get upset with her sisters she will do emotional eating. She will go out [facility] next with her husband and they will eat at night. She has also been eating cookies at home. Patient reported that she will try to lose weight. I have restarted her on Topamax but she has not lost any weight and has gained several pounds in the past couple of months. We discussed about her exercise and weight loss during the interview. Patient reported that she will start exercise and walking as she has lost weight in the past. She is also interested in going higher on the dose of Topamax at this time. She is compliant with other medications. She denied having any suicidal ideations or plans. She continues to be distressed about the relationship with her sisters.   She is not taking zolpidem and Xanax at this time. She denied having any suicidal ideations or plans.    Chief Complaint:  Chief Complaint    Follow-up; Medication Refill     Visit Diagnosis:     ICD-9-CM ICD-10-CM   1. MDD (major depressive disorder), recurrent episode, moderate ([facility]) 296.32 F33.1   2. Anxiety state 300.00 F41.1     Past Medical History:  Past Medical History:  Diagnosis Date  . Anxiety   . Fracture    ankle  . Heart murmur   . Hypothyroid   . Thyroid disease     Past Surgical History:  Procedure Laterality Date  . ABDOMINAL SURGERY     tummy tuck  . BREAST BIOPSY Left 10+ years   Negative   Family History:  Family History  Problem Relation Age of Onset  .  Hypertension Mother   . Alcohol abuse Father   . Anxiety disorder Sister   . Prostate cancer Brother   . Hypertension Sister   . Breast cancer Sister   . Breast cancer Cousin    Social History:  Social History   Social History  . Marital status: Married    Spouse name: N/A  . Number of children: N/A  . Years of education: N/A   Social History Main Topics  . Smoking status: Never Smoker  . Smokeless tobacco: Never Used  . Alcohol use 0.6 oz/week    1 Cans of beer per week  . Drug use: No  . Sexual activity: Yes   Other Topics Concern  . None   Social History Narrative  . None   Additional History: Married and lives with husband.   Assessment:   Musculoskeletal: Strength & Muscle Tone: within normal limits Gait & Station: normal Patient leans: N/A  Psychiatric Specialty Exam: Anxiety  Symptoms include insomnia and nervous/anxious behavior.    Depression         Associated symptoms include insomnia.  Past medical history includes anxiety.   Medication Refill     Review of Systems  Musculoskeletal: Positive for back pain.  Psychiatric/Behavioral: Positive for depression. The patient is nervous/anxious and has insomnia.   All  other systems reviewed and are negative.   Blood pressure 132/77, pulse 68, temperature 98 F (36.7 C), temperature source Oral, height 5\' 3"  (1.6 m), weight 193 lb 6.4 oz (87.7 kg).Body mass index is 34.26 kg/m.  General Appearance: Casual  Eye Contact:  Fair  Speech:  Clear and Coherent and Normal Rate  Volume:  Normal  Mood:  Euthymic  Affect:  Congruent  Thought Process:  Goal Directed  Orientation:  Full (Time, Place, and Person)  Thought Content:  WDL  Suicidal Thoughts:  No  Homicidal Thoughts:  No  Memory:  Immediate;   Fair  Judgement:  Fair  Insight:  Fair  Psychomotor Activity:  Normal  Concentration:  Fair  Recall:  [employer] of Knowledge: Fair  Language: Fair  Akathisia:  No  Handed:  Right  AIMS (if  indicated):    Assets:  Communication Skills Desire for Improvement Physical Health Social Support  ADL's:  Intact  Cognition: WNL  Sleep:     Is the patient at risk to self?  No. Has the patient been a risk to self in the past 6 months?  No. Has the patient been a risk to self within the distant past?  No. Is the patient a risk to others?  No. Has the patient been a risk to others in the past 6 months?  No. Has the patient been a risk to others within the distant past?  No.  Current Medications: Current Outpatient Prescriptions  Medication Sig Dispense Refill  . ALPRAZolam (XANAX) 0.5 MG tablet Take 1 tablet (0.5 mg total) by mouth daily as needed for anxiety. Pt no taking- she has supply 30 tablet 1  . b complex vitamins tablet Take 1 tablet by mouth daily.    . baclofen (LIORESAL) 10 MG tablet Limit 1 tablet by mouth per day or twice per day if tolerated   NO ZANAFLEX ( tizanidine ) 60 each 0  . Brexpiprazole (REXULTI) 0.5 MG TABS Take 0.5 mg by mouth daily after breakfast. 90 tablet 1  . DULoxetine (CYMBALTA) 60 MG capsule Take 1 capsule (60 mg total) by mouth daily. 90 capsule 2  . folic acid (FOLVITE) Q000111Q MCG tablet Take 400 mcg by mouth daily.    Marland Kitchen levothyroxine (SYNTHROID, LEVOTHROID) 88 MCG tablet Take 88 mcg by mouth daily.    Marland Kitchen LIVALO 2 MG TABS     . multivitamin (METANX) 3-35-2 MG TABS tablet Limit 1-2 tablets by mouth per day if tolerated 60 tablet 0  . Oxycodone HCl 10 MG TABS Limit 1/2-1 tablet by mouth 2 - 4  times a day if tolerated 110 tablet 0  . topiramate (TOPAMAX) 100 MG tablet Take 1 tablet (100 mg total) by mouth daily. 30 tablet 1  . zolpidem (AMBIEN) 5 MG tablet Take 1 tablet (5 mg total) by mouth at bedtime as needed. for sleep 30 tablet 1   No current facility-administered medications for this visit.     Medical Decision Making:  Review of Psycho-Social Stressors (1) and Review and summation of old records (2)  Treatment Plan Summary:Medication  management   Depression  Continue Cymbalta 60 mg by mouth daily .  Continue Rexulti 0.5 milligrams daily to help with her depressive symptoms-   She does not take Xanax and zolpidem at this time.  . Started on Topamax 100  mg by mouth daily at bedtime to help with the weight gain. Discussed with her about the side effects in detail and she  demonstrated understanding.  She will follow-up in 2  month or earlier depending on her symptoms   More than 50% of the time spent in psychoeducation, counseling and coordination of care.    This note was generated in part or whole with voice recognition software. Voice regonition is usually quite accurate but there are transcription errors that can and very often do occur. I apologize for any typographical errors that were not detected and corrected.   Rainey Pines, MD  10/05/2015, 2:30 PM

## 2015-10-06 ENCOUNTER — Encounter: Payer: Self-pay | Admitting: Pain Medicine

## 2015-10-06 ENCOUNTER — Ambulatory Visit: Payer: Commercial Managed Care - HMO | Attending: Pain Medicine | Admitting: Pain Medicine

## 2015-10-06 VITALS — BP 127/82 | HR 77 | Temp 98.4°F | Resp 18 | Ht 62.0 in | Wt 192.0 lb

## 2015-10-06 DIAGNOSIS — M5010 Cervical disc disorder with radiculopathy, unspecified cervical region: Secondary | ICD-10-CM | POA: Insufficient documentation

## 2015-10-06 DIAGNOSIS — M542 Cervicalgia: Secondary | ICD-10-CM | POA: Diagnosis present

## 2015-10-06 DIAGNOSIS — M5136 Other intervertebral disc degeneration, lumbar region: Secondary | ICD-10-CM | POA: Insufficient documentation

## 2015-10-06 DIAGNOSIS — S82891A Other fracture of right lower leg, initial encounter for closed fracture: Secondary | ICD-10-CM

## 2015-10-06 DIAGNOSIS — M79606 Pain in leg, unspecified: Secondary | ICD-10-CM | POA: Diagnosis present

## 2015-10-06 DIAGNOSIS — G5600 Carpal tunnel syndrome, unspecified upper limb: Secondary | ICD-10-CM | POA: Diagnosis not present

## 2015-10-06 DIAGNOSIS — M533 Sacrococcygeal disorders, not elsewhere classified: Secondary | ICD-10-CM

## 2015-10-06 DIAGNOSIS — M5481 Occipital neuralgia: Secondary | ICD-10-CM

## 2015-10-06 DIAGNOSIS — M545 Low back pain: Secondary | ICD-10-CM | POA: Diagnosis present

## 2015-10-06 DIAGNOSIS — M47816 Spondylosis without myelopathy or radiculopathy, lumbar region: Secondary | ICD-10-CM

## 2015-10-06 MED ORDER — OXYCODONE HCL 10 MG PO TABS: ORAL_TABLET | ORAL | 0 refills | Status: DC

## 2015-10-06 NOTE — Patient Instructions (Signed)
PLAN   Continue present medication oxycodone  and  baclofen   NO ZANAFLEX   Continue vitamin B complex.   NO MOBIC OR SIMILAR NONSTEROIDAL ANTI-INFLAMMATORY  MEDICATIONS as previously discussed  F/U PCP Dr.N Humphrey Rolls   for evaliation of  BP arthritis and general medical  condition..   F/U surgical evaluation. May consider pending follow-up evaluations  F/U  Dr.Faheem as discussed  May consider cervical MRI, lumbar MRI [facility] EMG studies and other studies as discussed. We will delay at this time as previously discussed  Will consider MRI of knee as well as additional studies pending follow-up evaluation as previously discussed  F/U neurological evaluation. May consider [facility] EMG studies and other studies pending follow-up evaluations to further evaluate right lower extremity paresthesias . We will avoid such studies at this time  May consider radiofrequency rhizolysis or intraspinal procedures pending response to present treatment and F/U evaluation. We will avoid such treatment at this time   Patient to call [facility] Management Center should patient have concerns prior to scheduled appointment

## 2015-10-06 NOTE — Progress Notes (Signed)
Safety precautions to be maintained throughout the outpatient stay will include: orient to surroundings, keep bed in low position, maintain call bell within reach at all times, provide assistance with transfer out of bed and ambulation.  

## 2015-10-06 NOTE — Progress Notes (Signed)
    The patient is a 60 year old female who returns to pain management for further evaluation and treatment of pain involving neck upper mid lower back and lower extremity regions. The patient is with pain fairly well-controlled at this time. The patient continues baclofen and oxycodone at this time. The patient denies any trauma change in events of daily living the call significant change in symptomatology. The patient is able to make quilts and is without complaint of pain due to carpal tunnel syndrome. The patient also is walking on a regular basis and is without significant pain of the mid lower back and lower extremity regions. We will continue medications as prescribed this time and we will remain available to consider modifications of treatment pending follow-up evaluation. All agreed to suggested treatment plan.     Physical examination  There was tenderness to palpation of the paraspinal musculature in the cervical region cervical facet region a mild to moderate degree with mild to moderate tenderness over the splenius capitis and occipitalis region. Palpation over the thoracic region was attends to palpation of moderate degree. Palpation of the trapezius levator scapula and rhomboid musculature regions reproduce moderate discomfort as well. The patient appeared to be with unremarkable Spurling's maneuver. The patient appeared to be with slightly decreased grip strength and Tinel and Phalen's maneuver reproducing minimal discomfort. Palpation over the lumbar region was attends to palpation of moderate degree with lateral bending and rotation extension and palpation of the lumbar facets reproducing moderate discomfort. There was moderate tenderness to palpation of the greater trochanteric region iliotibial band region and I'll to moderate tenderness of the PSIS and PII S regions. Straight leg raising was tolerates approximately 30 without increased pain with dorsiflexion noted. EHL strength  appeared to be equal with sensory deficit or dermatomal dystrophy detected. The knee was attends to palpation of mild degree with negative anterior and posterior drawer signs without ballottement of the patella. There was negative clonus negative Homans. Abdomen nontender with no costovertebral angle tenderness noted.     Assessment  Degenerative disc disease cervical spine  Cervical radiculopathy  Cervical facet syndrome  Carpal tunnel syndrome  Thoracic outlet syndrome (will consider additional studies to evaluate for thoracic outlet syndrome)  Degenerative disc disease of the lumbar spine   Lumbar facet syndrome  Piriformis syndrome     PLAN   Continue present medication oxycodone  and  baclofen   NO ZANAFLEX   Continue vitamin B complex.   NO MOBIC OR SIMILAR NONSTEROIDAL ANTI-INFLAMMATORY  MEDICATIONS as previously discussed  F/U PCP Dr.N Humphrey Rolls   for evaliation of  BP arthritis and general medical  condition..   F/U surgical evaluation. May consider pending follow-up evaluations  F/U  Dr.Faheem as discussed  May consider cervical MRI, lumbar MRI [facility] EMG studies and other studies as discussed. We will delay at this time as previously discussed  Will consider MRI of knee as well as additional studies pending follow-up evaluation as previously discussed. Patient with improvement of pain involving the knee  F/U neurological evaluation. May consider PN[facility] EMG studies and other studies pending follow-up evaluations to further evaluate right lower extremity paresthesias . We will avoid such studies at this time  May consider radiofrequency rhizolysis or intraspinal procedures pending response to present treatment and F/U evaluation. We will avoid such treatment at this time   Patient to call Pain Management Center should patient have concerns prior to scheduled appointment

## 2015-10-11 ENCOUNTER — Ambulatory Visit: Payer: Self-pay | Admitting: Pain Medicine

## 2015-10-26 ENCOUNTER — Telehealth: Payer: Self-pay | Admitting: *Deleted

## 2015-11-04 ENCOUNTER — Other Ambulatory Visit: Payer: Self-pay | Admitting: Psychiatry

## 2015-11-05 NOTE — Telephone Encounter (Signed)
refilled 

## 2015-11-07 ENCOUNTER — Other Ambulatory Visit: Payer: Self-pay | Admitting: Pain Medicine

## 2015-11-08 ENCOUNTER — Other Ambulatory Visit: Payer: Self-pay | Admitting: Pain Medicine

## 2015-11-09 ENCOUNTER — Ambulatory Visit: Payer: Self-pay | Admitting: Pain Medicine

## 2015-11-25 NOTE — Telephone Encounter (Signed)
called and left a voice mail with the rexulti .5mg  info.

## 2015-12-04 ENCOUNTER — Other Ambulatory Visit: Payer: Self-pay | Admitting: Psychiatry

## 2015-12-06 ENCOUNTER — Ambulatory Visit: Payer: 59 | Admitting: Psychiatry

## 2015-12-07 ENCOUNTER — Ambulatory Visit: Payer: 59 | Admitting: Psychiatry

## 2015-12-13 ENCOUNTER — Ambulatory Visit (INDEPENDENT_AMBULATORY_CARE_PROVIDER_SITE_OTHER): Payer: 59 | Admitting: Psychiatry

## 2015-12-13 ENCOUNTER — Encounter: Payer: Self-pay | Admitting: Psychiatry

## 2015-12-13 ENCOUNTER — Other Ambulatory Visit: Payer: Self-pay | Admitting: Pain Medicine

## 2015-12-13 VITALS — BP 147/88 | HR 71 | Temp 98.6°F | Wt 196.2 lb

## 2015-12-13 DIAGNOSIS — F331 Major depressive disorder, recurrent, moderate: Secondary | ICD-10-CM

## 2015-12-13 DIAGNOSIS — F411 Generalized anxiety disorder: Secondary | ICD-10-CM

## 2015-12-13 MED ORDER — TOPIRAMATE 100 MG PO TABS: 100.0000 mg | ORAL_TABLET | Freq: Every day | ORAL | 1 refills | Status: DC

## 2015-12-13 MED ORDER — ALPRAZOLAM 0.5 MG PO TABS: 0.5000 mg | ORAL_TABLET | Freq: Every day | ORAL | 1 refills | Status: DC | PRN

## 2015-12-13 MED ORDER — BREXPIPRAZOLE 0.5 MG PO TABS: 0.5000 mg | ORAL_TABLET | Freq: Every day | ORAL | 1 refills | Status: DC

## 2015-12-13 MED ORDER — DULOXETINE HCL 60 MG PO CPEP: 60.0000 mg | ORAL_CAPSULE | Freq: Every day | ORAL | 2 refills | Status: DC

## 2015-12-13 NOTE — Progress Notes (Signed)
[facility] MD/PA/NP OP Progress Note  12/13/2015 1:19 PM Shannon Crawford  MRN:  [id number]  Subjective:   Pt  is a 60 year old married female  who presented for the follow-up appointment. She reported that she has been taking her medications. She has stopped taking the Topamax for 2 weeks but restarted again last night as she has been gaining weight. She was concerned about her weight issues. She reported that she continues to feel anxious. She has not been sleeping well at night. She reported that she is planning to follow up with her primary care physician Dr. Humphrey Rolls to discuss about her hypertension and about her thyroid problems. She currently denied having any suicidal ideations or plans. We discussed about her medications at length and she demonstrated understanding.  She is compliant with other medications. She denied having any suicidal ideations or plans.  She is  taking zolpidem and Xanax prn  at this time.   Chief Complaint:  Chief Complaint    Follow-up; Medication Refill     Visit Diagnosis:   No diagnosis found.  Past Medical History:  Past Medical History:  Diagnosis Date  . Anxiety   . Fracture    ankle  . Heart murmur   . Hypothyroid   . Thyroid disease     Past Surgical History:  Procedure Laterality Date  . ABDOMINAL SURGERY     tummy tuck  . BREAST BIOPSY Left 10+ years   Negative   Family History:  Family History  Problem Relation Age of Onset  . Hypertension Mother   . Alcohol abuse Father   . Anxiety disorder Sister   . Prostate cancer Brother   . Hypertension Sister   . Breast cancer Sister   . Breast cancer Cousin    Social History:  Social History   Social History  . Marital status: Married    Spouse name: N/A  . Number of children: N/A  . Years of education: N/A   Social History Main Topics  . Smoking status: Never Smoker  . Smokeless tobacco: Never Used  . Alcohol use 0.6 oz/week    1 Cans of beer per week  . Drug use: No  . Sexual  activity: Yes   Other Topics Concern  . None   Social History Narrative  . None   Additional History: Married and lives with husband.   Assessment:   Musculoskeletal: Strength & Muscle Tone: within normal limits Gait & Station: normal Patient leans: N/A  Psychiatric Specialty Exam: Medication Refill   Anxiety  Symptoms include insomnia and nervous/anxious behavior.    Depression         Associated symptoms include insomnia.  Past medical history includes anxiety.     Review of Systems  Musculoskeletal: Positive for back pain.  Psychiatric/Behavioral: Positive for depression. The patient is nervous/anxious and has insomnia.   All other systems reviewed and are negative.   Blood pressure (!) 147/88, pulse 71, temperature 98.6 F (37 C), temperature source Oral, weight 196 lb 3.2 oz (89 kg).Body mass index is 35.89 kg/m.  General Appearance: Casual  Eye Contact:  Fair  Speech:  Clear and Coherent and Normal Rate  Volume:  Normal  Mood:  Euthymic  Affect:  Congruent  Thought Process:  Goal Directed  Orientation:  Full (Time, Place, and Person)  Thought Content:  WDL  Suicidal Thoughts:  No  Homicidal Thoughts:  No  Memory:  Immediate;   Fair  Judgement:  Fair  Insight:  Fair  Psychomotor Activity:  Normal  Concentration:  Fair  Recall:  [employer] of Knowledge: Fair  Language: Fair  Akathisia:  No  Handed:  Right  AIMS (if indicated):    Assets:  Communication Skills Desire for Improvement Physical Health Social Support  ADL's:  Intact  Cognition: WNL  Sleep:     Is the patient at risk to self?  No. Has the patient been a risk to self in the past 6 months?  No. Has the patient been a risk to self within the distant past?  No. Is the patient a risk to others?  No. Has the patient been a risk to others in the past 6 months?  No. Has the patient been a risk to others within the distant past?  No.  Current Medications: Current Outpatient Prescriptions   Medication Sig Dispense Refill  . ALPRAZolam (XANAX) 0.5 MG tablet Take 1 tablet (0.5 mg total) by mouth daily as needed for anxiety. Pt no taking- she has supply 30 tablet 1  . b complex vitamins tablet Take 1 tablet by mouth daily.    . baclofen (LIORESAL) 10 MG tablet Limit 1 tablet by mouth per day or twice per day if tolerated   NO ZANAFLEX ( tizanidine ) 60 each 0  . Brexpiprazole (REXULTI) 0.5 MG TABS Take 0.5 mg by mouth daily after breakfast. 90 tablet 1  . DULoxetine (CYMBALTA) 60 MG capsule Take 1 capsule (60 mg total) by mouth daily. 90 capsule 2  . folic acid (FOLVITE) Q000111Q MCG tablet Take 400 mcg by mouth daily.    Marland Kitchen levothyroxine (SYNTHROID, LEVOTHROID) 88 MCG tablet Take 88 mcg by mouth daily.    Marland Kitchen LIVALO 2 MG TABS     . multivitamin (METANX) 3-35-2 MG TABS tablet Limit 1-2 tablets by mouth per day if tolerated 60 tablet 0  . Oxycodone HCl 10 MG TABS Limit 1/2-1 tablet by mouth 2 - 4  times a day if tolerated 110 tablet 0  . REXULTI 0.5 MG TABS TAKE 1 TABLET BY MOUTH DAILY AFTER BREAKFAST. 30 tablet 1  . topiramate (TOPAMAX) 100 MG tablet TAKE 1 TABLET (100 MG TOTAL) BY MOUTH DAILY. 30 tablet 1  . zolpidem (AMBIEN) 5 MG tablet Take 1 tablet (5 mg total) by mouth at bedtime as needed. for sleep 30 tablet 1   No current facility-administered medications for this visit.     Medical Decision Making:  Review of Psycho-Social Stressors (1) and Review and summation of old records (2)  Treatment Plan Summary:Medication management   Depression  Continue Cymbalta 60 mg by mouth daily .  Continue Rexulti 0.5 milligrams daily to help with her depressive symptoms-   I will refill a prescription of Xanax at this time. She reported that she has enough supply of zolpidem.  Patient has 90 day supplies of Cymbalta and Rexulti.  She will follow-up with her primary care physician Dr. Humphrey Rolls to have the physical examination and will follow-up on her thyroid issues and her  hypertension.  Advised patient that I will be leaving this practice at the end of November and she demonstrated understanding  Discussed with her about the side effects in detail and she demonstrated understanding.  She will follow-up in 2  month or earlier depending on her symptoms   More than 50% of the time spent in psychoeducation, counseling and coordination of care.    This note was generated in part or whole with voice recognition software. Voice regonition is  usually quite accurate but there are transcription errors that can and very often do occur. I apologize for any typographical errors that were not detected and corrected.   Rainey Pines, MD  12/13/2015, 1:19 PM

## 2016-02-10 ENCOUNTER — Ambulatory Visit: Payer: 59 | Admitting: Psychiatry

## 2016-03-06 ENCOUNTER — Other Ambulatory Visit: Payer: Self-pay | Admitting: Pain Medicine

## 2016-03-10 ENCOUNTER — Ambulatory Visit: Payer: 59 | Admitting: Psychiatry

## 2016-03-22 ENCOUNTER — Encounter: Payer: Self-pay | Admitting: Psychiatry

## 2016-03-22 ENCOUNTER — Ambulatory Visit (INDEPENDENT_AMBULATORY_CARE_PROVIDER_SITE_OTHER): Payer: 59 | Admitting: Psychiatry

## 2016-03-22 VITALS — BP 136/88 | HR 73 | Temp 98.7°F | Wt 201.0 lb

## 2016-03-22 DIAGNOSIS — F411 Generalized anxiety disorder: Secondary | ICD-10-CM | POA: Diagnosis not present

## 2016-03-22 DIAGNOSIS — F331 Major depressive disorder, recurrent, moderate: Secondary | ICD-10-CM | POA: Diagnosis not present

## 2016-03-22 MED ORDER — BREXPIPRAZOLE 0.5 MG PO TABS: 0.5000 mg | ORAL_TABLET | Freq: Every day | ORAL | 1 refills | Status: DC

## 2016-03-22 MED ORDER — DULOXETINE HCL 60 MG PO CPEP: 60.0000 mg | ORAL_CAPSULE | Freq: Every day | ORAL | 2 refills | Status: DC

## 2016-03-22 NOTE — Progress Notes (Signed)
[facility] MD/PA/NP OP Progress Note  03/22/2016 1:54 PM Shannon Crawford  MRN:  [id number]  Subjective:   Pt  is a 61 year old married female  who presented for the follow-up appointment. She reported that she has gained some weight during the holidays. She reported that she has been trying to lose weight although she has already stopped taking the Topamax. Patient reported that she is spending time at home and is quilting. Patient reported that she is sleeping well and has already stopped taking the zolpidem. She had her physical exam done and the dose of the Synthroid was adjusted by her primary care physician. She appeared calm and alert during the interview. She reported that her mood symptoms are stable at this time. We discussed about her medications in detail. She does not have any other acute issues at this time. She denied having any suicidal homicidal ideations or plans. She denied having any perceptual disturbances.   Chief Complaint:  Chief Complaint    Follow-up; Medication Refill     Visit Diagnosis:     ICD-9-CM ICD-10-CM   1. MDD (major depressive disorder), recurrent episode, moderate (HCC) 296.32 F33.1   2. Anxiety state 300.00 F41.1     Past Medical History:  Past Medical History:  Diagnosis Date  . Anxiety   . Fracture    ankle  . Heart murmur   . Hypothyroid   . Thyroid disease     Past Surgical History:  Procedure Laterality Date  . ABDOMINAL SURGERY     tummy tuck  . BREAST BIOPSY Left 10+ years   Negative   Family History:  Family History  Problem Relation Age of Onset  . Hypertension Mother   . Alcohol abuse Father   . Anxiety disorder Sister   . Prostate cancer Brother   . Hypertension Sister   . Breast cancer Sister   . Breast cancer Cousin    Social History:  Social History   Social History  . Marital status: Married    Spouse name: N/A  . Number of children: N/A  . Years of education: N/A   Social History Main Topics  . Smoking status: Never  Smoker  . Smokeless tobacco: Never Used  . Alcohol use 0.6 oz/week    1 Cans of beer per week  . Drug use: No  . Sexual activity: Yes   Other Topics Concern  . None   Social History Narrative  . None   Additional History: Married and lives with husband.   Assessment:   Musculoskeletal: Strength & Muscle Tone: within normal limits Gait & Station: normal Patient leans: N/A  Psychiatric Specialty Exam: Medication Refill   Anxiety  Symptoms include insomnia and nervous/anxious behavior.    Depression         Associated symptoms include insomnia.  Past medical history includes anxiety.     Review of Systems  Musculoskeletal: Positive for back pain.  Psychiatric/Behavioral: Positive for depression. The patient is nervous/anxious and has insomnia.   All other systems reviewed and are negative.   Blood pressure 136/88, pulse 73, temperature 98.7 F (37.1 C), temperature source Oral, weight 201 lb (91.2 kg).Body mass index is 36.76 kg/m.  General Appearance: Casual  Eye Contact:  Fair  Speech:  Clear and Coherent and Normal Rate  Volume:  Normal  Mood:  Euthymic  Affect:  Congruent  Thought Process:  Goal Directed  Orientation:  Full (Time, Place, and Person)  Thought Content:  WDL  Suicidal Thoughts:  No  Homicidal Thoughts:  No  Memory:  Immediate;   Fair  Judgement:  Fair  Insight:  Fair  Psychomotor Activity:  Normal  Concentration:  Fair  Recall:  [employer] of Knowledge: Fair  Language: Fair  Akathisia:  No  Handed:  Right  AIMS (if indicated):    Assets:  Communication Skills Desire for Improvement Physical Health Social Support  ADL's:  Intact  Cognition: WNL  Sleep:     Is the patient at risk to self?  No. Has the patient been a risk to self in the past 6 months?  No. Has the patient been a risk to self within the distant past?  No. Is the patient a risk to others?  No. Has the patient been a risk to others in the past 6 months?  No. Has the  patient been a risk to others within the distant past?  No.  Current Medications: Current Outpatient Prescriptions  Medication Sig Dispense Refill  . b complex vitamins tablet Take 1 tablet by mouth daily.    . baclofen (LIORESAL) 10 MG tablet Limit 1 tablet by mouth per day or twice per day if tolerated   NO ZANAFLEX ( tizanidine ) 60 each 0  . Brexpiprazole (REXULTI) 0.5 MG TABS Take 1 tablet (0.5 mg total) by mouth daily after breakfast. 90 tablet 1  . DULoxetine (CYMBALTA) 60 MG capsule Take 1 capsule (60 mg total) by mouth daily. 90 capsule 2  . folic acid (FOLVITE) Q000111Q MCG tablet Take 400 mcg by mouth daily.    Marland Kitchen levothyroxine (SYNTHROID, LEVOTHROID) 88 MCG tablet Take 88 mcg by mouth daily.    Marland Kitchen LIVALO 2 MG TABS     . multivitamin (METANX) 3-35-2 MG TABS tablet Limit 1-2 tablets by mouth per day if tolerated 60 tablet 0  . Oxycodone HCl 10 MG TABS Limit 1/2-1 tablet by mouth 2 - 4  times a day if tolerated 110 tablet 0  . metoprolol succinate (TOPROL-XL) 25 MG 24 hr tablet     . rosuvastatin (CRESTOR) 20 MG tablet      No current facility-administered medications for this visit.     Medical Decision Making:  Review of Psycho-Social Stressors (1) and Review and summation of old records (2)  Treatment Plan Summary:Medication management   Depression  Continue Cymbalta 60 mg by mouth daily .  Continue Rexulti 0.5 milligrams daily to help with her depressive symptoms-   Discontinue Xanax and zolpidem..    Discussed with her about the side effects in detail and she demonstrated understanding.  She will follow-up in 2  month or earlier depending on her symptoms   More than 50% of the time spent in psychoeducation, counseling and coordination of care.    This note was generated in part or whole with voice recognition software. Voice regonition is usually quite accurate but there are transcription errors that can and very often do occur. I apologize for any typographical errors  that were not detected and corrected.   Rainey Pines, MD  03/22/2016, 1:54 PM

## 2016-04-02 ENCOUNTER — Other Ambulatory Visit: Payer: Self-pay | Admitting: Psychiatry

## 2016-04-03 ENCOUNTER — Other Ambulatory Visit: Payer: Self-pay | Admitting: Pain Medicine

## 2016-05-17 ENCOUNTER — Ambulatory Visit (INDEPENDENT_AMBULATORY_CARE_PROVIDER_SITE_OTHER): Payer: 59 | Admitting: Psychiatry

## 2016-05-17 ENCOUNTER — Encounter: Payer: Self-pay | Admitting: Psychiatry

## 2016-05-17 ENCOUNTER — Ambulatory Visit: Payer: 59 | Admitting: Psychiatry

## 2016-05-17 VITALS — BP 138/85 | HR 76 | Temp 98.5°F | Wt 204.4 lb

## 2016-05-17 DIAGNOSIS — F411 Generalized anxiety disorder: Secondary | ICD-10-CM | POA: Diagnosis not present

## 2016-05-17 DIAGNOSIS — F331 Major depressive disorder, recurrent, moderate: Secondary | ICD-10-CM | POA: Diagnosis not present

## 2016-05-17 MED ORDER — BREXPIPRAZOLE 0.5 MG PO TABS: 0.5000 mg | ORAL_TABLET | Freq: Every day | ORAL | 1 refills | Status: DC

## 2016-05-17 MED ORDER — DULOXETINE HCL 60 MG PO CPEP: 60.0000 mg | ORAL_CAPSULE | Freq: Every day | ORAL | 2 refills | Status: DC

## 2016-05-17 NOTE — Progress Notes (Signed)
[facility] MD/PA/NP OP Progress Note  05/17/2016 2:40 PM Shannon Crawford  MRN:  [id number]  Subjective:   Pt  is a 61 year-old married female  who presented for the follow-up appointment. She reported that she has been doing well and has been taking her medications. She was showing me the pictures of her weights which she has been working on for the past few weeks. Patient appeared happy about the same. She reported that she has stopped exercising a few years ago. We discussed about his started exercising as she has been gaining weight. Patient reported that she wants to stop taking some of her medications. However she is stable on the current combination the medications. Patient reported that she sleeps well at night. She denied having any suicidal homicidal ideations or plans. She denied having any mood  symptoms at this time.     Chief Complaint:   Visit Diagnosis:     ICD-9-CM ICD-10-CM   1. MDD (major depressive disorder), recurrent episode, moderate (HCC) 296.32 F33.1   2. Anxiety state 300.00 F41.1     Past Medical History:  Past Medical History:  Diagnosis Date  . Anxiety   . Fracture    ankle  . Heart murmur   . Hypothyroid   . Thyroid disease     Past Surgical History:  Procedure Laterality Date  . ABDOMINAL SURGERY     tummy tuck  . BREAST BIOPSY Left 10+ years   Negative   Family History:  Family History  Problem Relation Age of Onset  . Hypertension Mother   . Alcohol abuse Father   . Anxiety disorder Sister   . Prostate cancer Brother   . Hypertension Sister   . Breast cancer Sister   . Breast cancer Cousin    Social History:  Social History   Social History  . Marital status: Married    Spouse name: N/A  . Number of children: N/A  . Years of education: N/A   Social History Main Topics  . Smoking status: Never Smoker  . Smokeless tobacco: Never Used  . Alcohol use 0.6 oz/week    1 Cans of beer per week  . Drug use: No  . Sexual activity: Yes   Other  Topics Concern  . Not on file   Social History Narrative  . No narrative on file   Additional History: Married and lives with husband.   Assessment:   Musculoskeletal: Strength & Muscle Tone: within normal limits Gait & Station: normal Patient leans: N/A  Psychiatric Specialty Exam: Medication Refill   Anxiety  Symptoms include insomnia and nervous/anxious behavior.    Depression         Associated symptoms include insomnia.  Past medical history includes anxiety.     Review of Systems  Musculoskeletal: Positive for back pain.  Psychiatric/Behavioral: Positive for depression. The patient is nervous/anxious and has insomnia.   All other systems reviewed and are negative.   There were no vitals taken for this visit.There is no height or weight on file to calculate BMI.  General Appearance: Casual  Eye Contact:  Fair  Speech:  Clear and Coherent and Normal Rate  Volume:  Normal  Mood:  Euthymic  Affect:  Congruent  Thought Process:  Goal Directed  Orientation:  Full (Time, Place, and Person)  Thought Content:  WDL  Suicidal Thoughts:  No  Homicidal Thoughts:  No  Memory:  Immediate;   Fair  Judgement:  Fair  Insight:  Fair  Psychomotor Activity:  Normal  Concentration:  Fair  Recall:  [employer] of Knowledge: Fair  Language: Fair  Akathisia:  No  Handed:  Right  AIMS (if indicated):    Assets:  Communication Skills Desire for Improvement Physical Health Social Support  ADL's:  Intact  Cognition: WNL  Sleep:     Is the patient at risk to self?  No. Has the patient been a risk to self in the past 6 months?  No. Has the patient been a risk to self within the distant past?  No. Is the patient a risk to others?  No. Has the patient been a risk to others in the past 6 months?  No. Has the patient been a risk to others within the distant past?  No.  Current Medications: Current Outpatient Prescriptions  Medication Sig Dispense Refill  . b complex vitamins  tablet Take 1 tablet by mouth daily.    . baclofen (LIORESAL) 10 MG tablet Limit 1 tablet by mouth per day or twice per day if tolerated   NO ZANAFLEX ( tizanidine ) 60 each 0  . Brexpiprazole (REXULTI) 0.5 MG TABS Take 1 tablet (0.5 mg total) by mouth daily after breakfast. 90 tablet 1  . DULoxetine (CYMBALTA) 60 MG capsule Take 1 capsule (60 mg total) by mouth daily. 90 capsule 2  . folic acid (FOLVITE) 628 MCG tablet Take 400 mcg by mouth daily.    Marland Kitchen levothyroxine (SYNTHROID, LEVOTHROID) 88 MCG tablet Take 88 mcg by mouth daily.    Marland Kitchen LIVALO 2 MG TABS     . metoprolol succinate (TOPROL-XL) 25 MG 24 hr tablet     . multivitamin (METANX) 3-35-2 MG TABS tablet Limit 1-2 tablets by mouth per day if tolerated 60 tablet 0  . Oxycodone HCl 10 MG TABS Limit 1/2-1 tablet by mouth 2 - 4  times a day if tolerated 110 tablet 0  . rosuvastatin (CRESTOR) 20 MG tablet      No current facility-administered medications for this visit.     Medical Decision Making:  Review of Psycho-Social Stressors (1) and Review and summation of old records (2)  Treatment Plan Summary:Medication management   Depression  Continue Cymbalta 60 mg by mouth daily .  Continue Rexulti 0.5 milligrams daily to help with her depressive symptoms-   Discussed with her about the side effects in detail and she demonstrated understanding.  She will follow-up in 2  month or earlier depending on her symptoms   More than 50% of the time spent in psychoeducation, counseling and coordination of care.    This note was generated in part or whole with voice recognition software. Voice regonition is usually quite accurate but there are transcription errors that can and very often do occur. I apologize for any typographical errors that were not detected and corrected.   Rainey Pines, MD  05/17/2016, 2:40 PM

## 2016-07-12 ENCOUNTER — Ambulatory Visit (INDEPENDENT_AMBULATORY_CARE_PROVIDER_SITE_OTHER): Payer: 59 | Admitting: Psychiatry

## 2016-07-12 ENCOUNTER — Encounter: Payer: Self-pay | Admitting: Psychiatry

## 2016-07-12 VITALS — BP 148/86 | HR 79 | Temp 98.8°F | Wt 211.0 lb

## 2016-07-12 DIAGNOSIS — F411 Generalized anxiety disorder: Secondary | ICD-10-CM | POA: Diagnosis not present

## 2016-07-12 DIAGNOSIS — F331 Major depressive disorder, recurrent, moderate: Secondary | ICD-10-CM

## 2016-07-12 MED ORDER — BREXPIPRAZOLE 0.5 MG PO TABS: 0.5000 mg | ORAL_TABLET | Freq: Every day | ORAL | 1 refills | Status: DC

## 2016-07-12 MED ORDER — HYDROXYZINE HCL 25 MG PO TABS: 25.0000 mg | ORAL_TABLET | Freq: Two times a day (BID) | ORAL | 1 refills | Status: DC

## 2016-07-12 MED ORDER — DULOXETINE HCL 60 MG PO CPEP: 60.0000 mg | ORAL_CAPSULE | Freq: Every day | ORAL | 2 refills | Status: DC

## 2016-07-12 NOTE — Progress Notes (Signed)
[facility] MD/PA/NP OP Progress Note  07/12/2016 2:29 PM Shannon Crawford  MRN:  [id number]  Subjective:   Pt  is a 61 year-old married female  who presented for the follow-up appointment. She reported that she has just returned from a sewing. She reported that she enjoyed the. She appears tired from the oxycodone as she has been taking it 4 times daily. She reported that she follows with Dr. Primus Bravo in [place]. She reported that she will address with him about the dose of the oxycodone. She reported that she has been compliant with her medications. Patient denied having any side effects of the other medications. She reported that she wants to be prescribed Xanax again. We discussed about the Xanax as she cannot be taking Xanax in the combination of the oxycodone. She agreed with the plan. She reported that she feels anxious at times. She is also receptive to medication changes. She denied having any suicidal homicidal ideations or plans at this time.      Chief Complaint:  Chief Complaint    Follow-up; Medication Refill     Visit Diagnosis:     ICD-9-CM ICD-10-CM   1. MDD (major depressive disorder), recurrent episode, moderate (HCC) 296.32 F33.1   2. Anxiety state 300.00 F41.1     Past Medical History:  Past Medical History:  Diagnosis Date  . Anxiety   . Fracture    ankle  . Heart murmur   . Hypothyroid   . Thyroid disease     Past Surgical History:  Procedure Laterality Date  . ABDOMINAL SURGERY     tummy tuck  . BREAST BIOPSY Left 10+ years   Negative   Family History:  Family History  Problem Relation Age of Onset  . Hypertension Mother   . Alcohol abuse Father   . Anxiety disorder Sister   . Prostate cancer Brother   . Hypertension Sister   . Breast cancer Sister   . Breast cancer Cousin    Social History:  Social History   Social History  . Marital status: Married    Spouse name: N/A  . Number of children: N/A  . Years of education: N/A   Social History Main  Topics  . Smoking status: Never Smoker  . Smokeless tobacco: Never Used  . Alcohol use 0.6 oz/week    1 Cans of beer per week  . Drug use: No  . Sexual activity: Yes   Other Topics Concern  . None   Social History Narrative  . None   Additional History: Married and lives with husband.   Assessment:   Musculoskeletal: Strength & Muscle Tone: within normal limits Gait & Station: normal Patient leans: N/A  Psychiatric Specialty Exam: Medication Refill   Anxiety  Symptoms include insomnia and nervous/anxious behavior.    Depression         Associated symptoms include insomnia.  Past medical history includes anxiety.     Review of Systems  Musculoskeletal: Positive for back pain.  Psychiatric/Behavioral: Positive for depression. The patient is nervous/anxious and has insomnia.   All other systems reviewed and are negative.   Blood pressure (!) 148/86, pulse 79, temperature 98.8 F (37.1 C), temperature source Oral, weight 211 lb (95.7 kg).Body mass index is 38.59 kg/m.  General Appearance: Casual  Eye Contact:  Fair  Speech:  Clear and Coherent and Normal Rate  Volume:  Normal  Mood:  Euthymic  Affect:  Congruent  Thought Process:  Goal Directed  Orientation:  Full (  Time, Place, and Person)  Thought Content:  WDL  Suicidal Thoughts:  No  Homicidal Thoughts:  No  Memory:  Immediate;   Fair  Judgement:  Fair  Insight:  Fair  Psychomotor Activity:  Normal  Concentration:  Fair  Recall:  [employer] of Knowledge: Fair  Language: Fair  Akathisia:  No  Handed:  Right  AIMS (if indicated):    Assets:  Communication Skills Desire for Improvement Physical Health Social Support  ADL's:  Intact  Cognition: WNL  Sleep:     Is the patient at risk to self?  No. Has the patient been a risk to self in the past 6 months?  No. Has the patient been a risk to self within the distant past?  No. Is the patient a risk to others?  No. Has the patient been a risk to others  in the past 6 months?  No. Has the patient been a risk to others within the distant past?  No.  Current Medications: Current Outpatient Prescriptions  Medication Sig Dispense Refill  . b complex vitamins tablet Take 1 tablet by mouth daily.    . baclofen (LIORESAL) 10 MG tablet Limit 1 tablet by mouth per day or twice per day if tolerated   NO ZANAFLEX ( tizanidine ) 60 each 0  . Brexpiprazole (REXULTI) 0.5 MG TABS Take 1 tablet (0.5 mg total) by mouth daily after breakfast. 90 tablet 1  . DULoxetine (CYMBALTA) 60 MG capsule Take 1 capsule (60 mg total) by mouth daily. 90 capsule 2  . folic acid (FOLVITE) 035 MCG tablet Take 400 mcg by mouth daily.    . hydrOXYzine (ATARAX/VISTARIL) 25 MG tablet Take 1 tablet (25 mg total) by mouth 2 (two) times daily. 60 tablet 1  . levothyroxine (SYNTHROID, LEVOTHROID) 88 MCG tablet Take 88 mcg by mouth daily.    Marland Kitchen LIVALO 2 MG TABS     . metoprolol succinate (TOPROL-XL) 25 MG 24 hr tablet     . multivitamin (METANX) 3-35-2 MG TABS tablet Limit 1-2 tablets by mouth per day if tolerated 60 tablet 0  . Oxycodone HCl 10 MG TABS Limit 1/2-1 tablet by mouth 2 - 4  times a day if tolerated 110 tablet 0  . rosuvastatin (CRESTOR) 20 MG tablet      No current facility-administered medications for this visit.     Medical Decision Making:  Review of Psycho-Social Stressors (1) and Review and summation of old records (2)  Treatment Plan Summary:Medication management   Depression  Continue Cymbalta 60 mg by mouth daily .  Continue Rexulti 0.5 milligrams daily to help with her depressive symptoms-   I will start her on hydroxyzine 25 mg by mouth twice a day when necessary for anxiety symptoms and she agreed with the plan.  Discussed with her about the side effects in detail and she demonstrated understanding.  She will follow-up in 2  month or earlier depending on her symptoms   More than 50% of the time spent in psychoeducation, counseling and coordination  of care.    This note was generated in part or whole with voice recognition software. Voice regonition is usually quite accurate but there are transcription errors that can and very often do occur. I apologize for any typographical errors that were not detected and corrected.   Rainey Pines, MD  07/12/2016, 2:29 PM

## 2016-09-11 ENCOUNTER — Ambulatory Visit: Payer: 59 | Admitting: Psychiatry

## 2016-09-17 ENCOUNTER — Other Ambulatory Visit: Payer: Self-pay | Admitting: Psychiatry

## 2017-08-10 ENCOUNTER — Other Ambulatory Visit: Payer: Self-pay | Admitting: Psychiatry

## 2017-10-09 ENCOUNTER — Telehealth: Payer: Self-pay

## 2017-10-09 NOTE — Telephone Encounter (Signed)
tried to call pt but no message could be left will try tomorrow .

## 2017-10-09 NOTE — Telephone Encounter (Signed)
pt called left message that she needed an appt to be seen .

## 2017-10-15 ENCOUNTER — Other Ambulatory Visit: Payer: Self-pay | Admitting: Internal Medicine

## 2017-10-15 ENCOUNTER — Encounter: Payer: Self-pay | Admitting: Psychiatry

## 2017-10-15 ENCOUNTER — Ambulatory Visit: Payer: 59 | Admitting: Psychiatry

## 2017-10-15 ENCOUNTER — Other Ambulatory Visit: Payer: Self-pay

## 2017-10-15 VITALS — BP 126/79 | HR 83 | Temp 98.2°F | Wt 187.0 lb

## 2017-10-15 DIAGNOSIS — F411 Generalized anxiety disorder: Secondary | ICD-10-CM | POA: Diagnosis not present

## 2017-10-15 DIAGNOSIS — F331 Major depressive disorder, recurrent, moderate: Secondary | ICD-10-CM | POA: Diagnosis not present

## 2017-10-15 DIAGNOSIS — Z1231 Encounter for screening mammogram for malignant neoplasm of breast: Secondary | ICD-10-CM

## 2017-10-15 NOTE — Progress Notes (Signed)
BH MD/PA/NP OP Progress Note  10/15/2017 3:47 PM Shannon Crawford  MRN:  [id number]  Subjective:   Pt  is a 62 year-old married female  who presented for the follow-up appointment. She was last seen more than one year ago.  She reported that she has been following with her primary care physician Dr. Humphrey Rolls . patient reported that she has been taking Cymbalta 60 mg and has been stable on the medication.  Patient reported that she wants to decrease the dose of the medication as she feels that she is not having any acute symptoms at this time.  However she has recently filled the Cymbalta 60 mg on Friday.  We discussed about her medications in detail.  She reported that she would like to taper off from the medication.  She reported that she is planning to go to [place] during the winter from January until April.  She stated that she does not have any depression or anxiety or paranoia at this time.  She is also going to have the sleep study as suggested by her PCP.   Patient appeared calm and alert during the interview.  She does not have any mood swings anger or anxiety at this time.          Chief Complaint:  Chief Complaint    Follow-up; Medication Refill     Visit Diagnosis:     ICD-10-CM   1. MDD (major depressive disorder), recurrent episode, moderate (HCC) F33.1   2. Anxiety state F41.1     Past Medical History:  Past Medical History:  Diagnosis Date  . Anxiety   . Fracture    ankle  . Heart murmur   . Hypothyroid   . Thyroid disease     Past Surgical History:  Procedure Laterality Date  . ABDOMINAL SURGERY     tummy tuck  . BREAST BIOPSY Left 10+ years   Negative   Family History:  Family History  Problem Relation Age of Onset  . Hypertension Mother   . Alcohol abuse Father   . Anxiety disorder Sister   . Prostate cancer Brother   . Hypertension Sister   . Breast cancer Sister   . Breast cancer Cousin    Social History:  Social History   Socioeconomic History   . Marital status: Married    Spouse name: Not on file  . Number of children: Not on file  . Years of education: Not on file  . Highest education level: Not on file  Occupational History  . Not on file  Social Needs  . Financial resource strain: Not on file  . Food insecurity:    Worry: Not on file    Inability: Not on file  . Transportation needs:    Medical: Not on file    Non-medical: Not on file  Tobacco Use  . Smoking status: Never Smoker  . Smokeless tobacco: Never Used  Substance and Sexual Activity  . Alcohol use: Yes    Alcohol/week: 1.0 standard drinks    Types: 1 Cans of beer per week  . Drug use: No  . Sexual activity: Yes  Lifestyle  . Physical activity:    Days per week: Not on file    Minutes per session: Not on file  . Stress: Not on file  Relationships  . Social connections:    Talks on phone: Not on file    Gets together: Not on file    Attends religious service: Not on file  Active member of club or organization: Not on file    Attends meetings of clubs or organizations: Not on file    Relationship status: Not on file  Other Topics Concern  . Not on file  Social History Narrative  . Not on file   Additional History: Married and lives with husband.   Assessment:   Musculoskeletal: Strength & Muscle Tone: within normal limits Gait & Station: normal Patient leans: N/A  Psychiatric Specialty Exam: Medication Refill   Anxiety  Symptoms include insomnia and nervous/anxious behavior.    Depression         Associated symptoms include insomnia.  Past medical history includes anxiety.     Review of Systems  Musculoskeletal: Positive for back pain.  Psychiatric/Behavioral: Positive for depression. The patient is nervous/anxious and has insomnia.   All other systems reviewed and are negative.   Blood pressure 126/79, pulse 83, temperature 98.2 F (36.8 C), temperature source Oral, weight 187 lb (84.8 kg).Body mass index is 34.2 kg/m.   General Appearance: Casual  Eye Contact:  Fair  Speech:  Clear and Coherent and Normal Rate  Volume:  Normal  Mood:  Euthymic  Affect:  Congruent  Thought Process:  Goal Directed  Orientation:  Full (Time, Place, and Person)  Thought Content:  WDL  Suicidal Thoughts:  No  Homicidal Thoughts:  No  Memory:  Immediate;   Fair  Judgement:  Fair  Insight:  Fair  Psychomotor Activity:  Normal  Concentration:  Fair  Recall:  [employer] of Knowledge: Fair  Language: Fair  Akathisia:  No  Handed:  Right  AIMS (if indicated):    Assets:  Communication Skills Desire for Improvement Physical Health Social Support  ADL's:  Intact  Cognition: WNL  Sleep:     Is the patient at risk to self?  No. Has the patient been a risk to self in the past 6 months?  No. Has the patient been a risk to self within the distant past?  No. Is the patient a risk to others?  No. Has the patient been a risk to others in the past 6 months?  No. Has the patient been a risk to others within the distant past?  No.  Current Medications: Current Outpatient Medications  Medication Sig Dispense Refill  . b complex vitamins tablet Take 1 tablet by mouth daily.    . DULoxetine (CYMBALTA) 60 MG capsule Take 1 capsule (60 mg total) by mouth daily. 90 capsule 2  . rosuvastatin (CRESTOR) 20 MG tablet     . SYNTHROID 100 MCG tablet      No current facility-administered medications for this visit.     Medical Decision Making:  Review of Psycho-Social Stressors (1) and Review and summation of old records (2)  Treatment Plan Summary:Medication management   Depression  Continue Cymbalta 60 mg by mouth and advised patient to take on alternate days and she agreed with the plan.  Discussed with her about the side effects in detail and she demonstrated understanding.  She will follow-up in 1  month or earlier depending on her symptoms   More than 50% of the time spent in psychoeducation, counseling and  coordination of care.    This note was generated in part or whole with voice recognition software. Voice regonition is usually quite accurate but there are transcription errors that can and very often do occur. I apologize for any typographical errors that were not detected and corrected.   Rainey Pines,  MD  10/15/2017, 3:47 PM

## 2017-10-31 ENCOUNTER — Ambulatory Visit
Admission: RE | Admit: 2017-10-31 | Discharge: 2017-10-31 | Disposition: A | Discharge status: Home / Self Care | Payer: 59 | Source: Ambulatory Visit | Attending: Internal Medicine | Admitting: Internal Medicine

## 2017-10-31 DIAGNOSIS — Z1231 Encounter for screening mammogram for malignant neoplasm of breast: Secondary | ICD-10-CM | POA: Diagnosis present

## 2017-11-12 ENCOUNTER — Ambulatory Visit: Payer: 59 | Admitting: Psychiatry

## 2017-11-12 ENCOUNTER — Encounter: Payer: Self-pay | Admitting: Psychiatry

## 2017-11-12 ENCOUNTER — Other Ambulatory Visit: Payer: Self-pay

## 2017-11-12 VITALS — BP 146/88 | HR 73 | Temp 98.6°F | Wt 188.6 lb

## 2017-11-12 DIAGNOSIS — F331 Major depressive disorder, recurrent, moderate: Secondary | ICD-10-CM | POA: Diagnosis not present

## 2017-11-12 DIAGNOSIS — F411 Generalized anxiety disorder: Secondary | ICD-10-CM

## 2017-11-12 MED ORDER — DULOXETINE HCL 30 MG PO CPEP: 30.0000 mg | ORAL_CAPSULE | Freq: Every day | ORAL | 2 refills | Status: DC

## 2017-11-12 NOTE — Progress Notes (Signed)
BH MD/PA/NP OP Progress Note  11/12/2017 3:34 PM LARUE DRAWDY  MRN:  [id number]  Subjective:   Pt  is a 62 year-old married female  who presented for the follow-up appointment. She reported that she continues to have depression and anxiety.  She stated that she does not know if decreasing the dose of Cymbalta to alternate days has been helping her.  She is interested in taking the Cymbalta 60 mg alternating with 30 mg.  Patient reported that she becomes more depressed during the winter and wants to continue with Cymbalta.  Patient currently denied having any suicidal homicidal ideations or plans.  She reported that she has been compliant with her medications.  She denied having any perceptual disturbances.  She stated that she has spent her weekend in [place]  with her daughter as she was babysitting her dog.  She enjoyed her weekend.  She reported that she has not started quitting back again.  Patient reported that her medications are helping her and she does not have any anxiety symptoms.  She continues to sleep well at night.  Denied use of drugs or alcohol at this time.  She is waiting for her sleep study next month.  .   Patient appeared calm and alert during the interview.  She does not have any mood swings anger or anxiety at this time.          Chief Complaint:  Chief Complaint    Follow-up; Medication Refill     Visit Diagnosis:     ICD-10-CM   1. MDD (major depressive disorder), recurrent episode, moderate (HCC) F33.1   2. Anxiety state F41.1     Past Medical History:  Past Medical History:  Diagnosis Date  . Anxiety   . Fracture    ankle  . Heart murmur   . Hypothyroid   . Thyroid disease     Past Surgical History:  Procedure Laterality Date  . ABDOMINAL SURGERY     tummy tuck  . BREAST BIOPSY Left 10+ years   Negative- core bx   Family History:  Family History  Problem Relation Age of Onset  . Hypertension Mother   . Alcohol abuse Father   . Anxiety  disorder Sister   . Prostate cancer Brother   . Hypertension Sister   . Breast cancer Sister 18  . Breast cancer Cousin    Social History:  Social History   Socioeconomic History  . Marital status: Married    Spouse name: Not on file  . Number of children: Not on file  . Years of education: Not on file  . Highest education level: Not on file  Occupational History  . Not on file  Social Needs  . Financial resource strain: Not on file  . Food insecurity:    Worry: Not on file    Inability: Not on file  . Transportation needs:    Medical: Not on file    Non-medical: Not on file  Tobacco Use  . Smoking status: Never Smoker  . Smokeless tobacco: Never Used  Substance and Sexual Activity  . Alcohol use: Yes    Alcohol/week: 1.0 standard drinks    Types: 1 Cans of beer per week  . Drug use: No  . Sexual activity: Yes  Lifestyle  . Physical activity:    Days per week: Not on file    Minutes per session: Not on file  . Stress: Not on file  Relationships  . Social connections:  Talks on phone: Not on file    Gets together: Not on file    Attends religious service: Not on file    Active member of club or organization: Not on file    Attends meetings of clubs or organizations: Not on file    Relationship status: Not on file  Other Topics Concern  . Not on file  Social History Narrative  . Not on file   Additional History: Married and lives with husband.   Assessment:   Musculoskeletal: Strength & Muscle Tone: within normal limits Gait & Station: normal Patient leans: N/A  Psychiatric Specialty Exam: Medication Refill   Anxiety  Symptoms include insomnia. Patient reports no nervous/anxious behavior.    Depression         Associated symptoms include insomnia.  Past medical history includes anxiety.     Review of Systems  Constitutional: Positive for weight loss.  HENT: Negative for nosebleeds.   Eyes: Negative for photophobia.  Cardiovascular: Negative  for orthopnea.  Musculoskeletal: Positive for back pain.  Skin: Negative for itching.  Neurological: Negative for sensory change.  Endo/Heme/Allergies: Negative for environmental allergies.  Psychiatric/Behavioral: Positive for depression. The patient has insomnia. The patient is not nervous/anxious.   All other systems reviewed and are negative.   Blood pressure (!) 146/88, pulse 73, temperature 98.6 F (37 C), temperature source Oral, weight 188 lb 9.6 oz (85.5 kg).Body mass index is 34.5 kg/m.  General Appearance: Casual  Eye Contact:  Fair  Speech:  Clear and Coherent and Normal Rate  Volume:  Normal  Mood:  Euthymic  Affect:  Congruent  Thought Process:  Goal Directed  Orientation:  Full (Time, Place, and Person)  Thought Content:  WDL  Suicidal Thoughts:  No  Homicidal Thoughts:  No  Memory:  Immediate;   Fair  Judgement:  Fair  Insight:  Fair  Psychomotor Activity:  Normal  Concentration:  Fair  Recall:  [employer] of Knowledge: Fair  Language: Fair  Akathisia:  No  Handed:  Right  AIMS (if indicated):    Assets:  Communication Skills Desire for Improvement Physical Health Social Support  ADL's:  Intact  Cognition: WNL  Sleep:     Is the patient at risk to self?  No. Has the patient been a risk to self in the past 6 months?  No. Has the patient been a risk to self within the distant past?  No. Is the patient a risk to others?  No. Has the patient been a risk to others in the past 6 months?  No. Has the patient been a risk to others within the distant past?  No.  Current Medications: Current Outpatient Medications  Medication Sig Dispense Refill  . b complex vitamins tablet Take 1 tablet by mouth daily.    . DULoxetine (CYMBALTA) 60 MG capsule Take 1 capsule (60 mg total) by mouth daily. 90 capsule 2  . rosuvastatin (CRESTOR) 20 MG tablet     . SYNTHROID 100 MCG tablet      No current facility-administered medications for this visit.     Medical  Decision Making:  Review of Psycho-Social Stressors (1) and Review and summation of old records (2)  Treatment Plan Summary:Medication management   Depression  Continue Cymbalta 60 mg by mouth and I will start her on Cymbalta 30  mg on alternate days.  She agreed with the plan.     .  Discussed with her about the side effects in detail and  she demonstrated understanding.  She will follow-up in 1  month or earlier depending on her symptoms   More than 50% of the time spent in psychoeducation, counseling and coordination of care.    This note was generated in part or whole with voice recognition software. Voice regonition is usually quite accurate but there are transcription errors that can and very often do occur. I apologize for any typographical errors that were not detected and corrected.   Rainey Pines, MD  11/12/2017, 3:34 PM

## 2017-11-27 ENCOUNTER — Ambulatory Visit: Payer: Self-pay | Admitting: Podiatry

## 2017-11-29 DIAGNOSIS — S42009A Fracture of unspecified part of unspecified clavicle, initial encounter for closed fracture: Secondary | ICD-10-CM | POA: Insufficient documentation

## 2017-11-30 ENCOUNTER — Ambulatory Visit (INDEPENDENT_AMBULATORY_CARE_PROVIDER_SITE_OTHER): Payer: 59

## 2017-11-30 ENCOUNTER — Encounter: Payer: Self-pay | Admitting: Podiatry

## 2017-11-30 ENCOUNTER — Ambulatory Visit: Payer: 59 | Admitting: Podiatry

## 2017-11-30 VITALS — BP 144/82 | HR 70 | Resp 16

## 2017-11-30 DIAGNOSIS — M722 Plantar fascial fibromatosis: Secondary | ICD-10-CM

## 2017-11-30 NOTE — Patient Instructions (Signed)

## 2017-12-04 NOTE — Progress Notes (Signed)
   Subjective: 62 year old female presenting today as a new patient with a chief complaint of aching pain to the plantar aspect of bilateral heels, left greater than right, that began about one year ago. She reports some associated pain to the posterior heels. Hiking and being active increases the pain. She has tried wearing new shoes for treatment. Patient is here for further evaluation and treatment.   Past Medical History:  Diagnosis Date  . Anxiety   . Fracture    ankle  . Heart murmur   . Hypothyroid   . Thyroid disease      Objective: Physical Exam General: The patient is alert and oriented x3 in no acute distress.  Dermatology: Skin is warm, dry and supple bilateral lower extremities. Negative for open lesions or macerations bilateral.   Vascular: Dorsalis Pedis and Posterior Tibial pulses palpable bilateral.  Capillary fill time is immediate to all digits.  Neurological: Epicritic and protective threshold intact bilateral.   Musculoskeletal: Tenderness to palpation to the plantar aspect of the bilateral heels along the plantar fascia. All other joints range of motion within normal limits bilateral. Strength 5/5 in all groups bilateral.   Radiographic exam: Normal osseous mineralization. Joint spaces preserved. No fracture/dislocation/boney destruction. No other soft tissue abnormalities or radiopaque foreign bodies.   Assessment: 1. plantar fasciitis bilateral feet  Plan of Care:  1. Patient evaluated. Xrays reviewed.   2. Injection of 0.5cc Celestone soluspan injected into the bilateral heels.  3. Rx for Meloxicam ordered for patient. 4. Appointment with [facility] for custom molded orthotics.  5. Plantar fascial band(s) dispensed for bilateral plantar fasciitis. 6. Instructed patient regarding therapies and modalities at home to alleviate symptoms.  7. Return to clinic in 6 weeks.    Edrick Kins, DPM [facility]  Dr. Edrick Kins, DPM    [address] [place], [place] [address]                Office [phone]  Fax [phone]

## 2017-12-05 ENCOUNTER — Ambulatory Visit (INDEPENDENT_AMBULATORY_CARE_PROVIDER_SITE_OTHER): Payer: 59 | Admitting: Orthotics

## 2017-12-05 DIAGNOSIS — M722 Plantar fascial fibromatosis: Secondary | ICD-10-CM | POA: Diagnosis not present

## 2017-12-05 NOTE — Progress Notes (Signed)

## 2017-12-07 ENCOUNTER — Other Ambulatory Visit: Payer: Self-pay | Admitting: Psychiatry

## 2017-12-17 ENCOUNTER — Encounter: Payer: Self-pay | Admitting: Psychiatry

## 2017-12-17 ENCOUNTER — Ambulatory Visit: Payer: 59 | Admitting: Psychiatry

## 2017-12-17 DIAGNOSIS — F331 Major depressive disorder, recurrent, moderate: Secondary | ICD-10-CM

## 2017-12-17 DIAGNOSIS — F411 Generalized anxiety disorder: Secondary | ICD-10-CM | POA: Diagnosis not present

## 2017-12-17 MED ORDER — ESCITALOPRAM OXALATE 10 MG PO TABS: ORAL_TABLET | ORAL | 1 refills | Status: DC

## 2017-12-17 NOTE — Progress Notes (Signed)
BH MD/PA/NP OP Progress Note  12/17/2017 2:18 PM QUIERA DIFFEE  MRN:  [id number]  Subjective:   Pt  is a 62 year-old married female  who presented for the follow-up appointment. She reported that she is noticed some improvement in her symptoms.  She stated that the Cymbalta has been helping her however she has noticed that the increased dose is not working out and she would like to have her medications adjusted.  Patient continues to be in the [place] group with her friends.  She reported that she missed her retreat over the weekend as her daughter was visiting her.  She reported that she is planning to spend time with her family over the holidays.  We discussed about having her medications adjusted and is interested in trying Lexapro at this time.  She discussed in detail about her vitiligo and the treatment for the same.  She reported that she feels anxious about the same and is tired of wearing long sleeved shirt.  She denied having any suicidal homicidal ideations or plans.  Her husband is supportive.  They are planning to spend 4 months in [place] during the winter.  She reported that they do not want to spend the time here as her husband has been retired 5 years and he has not done anything since his retirement.       She continues to sleep well at night.  Denied use of drugs or alcohol at this time.  She is waiting for her sleep study next month.  .   Patient appeared calm and alert during the interview.  She does not have any mood swings anger or anxiety at this time.          Chief Complaint:   Visit Diagnosis:     ICD-10-CM   1. MDD (major depressive disorder), recurrent episode, moderate (HCC) F33.1   2. Anxiety state F41.1     Past Medical History:  Past Medical History:  Diagnosis Date  . Anxiety   . Fracture    ankle  . Heart murmur   . Hypothyroid   . Thyroid disease     Past Surgical History:  Procedure Laterality Date  . ABDOMINAL SURGERY     tummy tuck   . BREAST BIOPSY Left 10+ years   Negative- core bx   Family History:  Family History  Problem Relation Age of Onset  . Hypertension Mother   . Alcohol abuse Father   . Anxiety disorder Sister   . Prostate cancer Brother   . Hypertension Sister   . Breast cancer Sister 10  . Breast cancer Cousin    Social History:  Social History   Socioeconomic History  . Marital status: Married    Spouse name: Not on file  . Number of children: Not on file  . Years of education: Not on file  . Highest education level: Not on file  Occupational History  . Not on file  Social Needs  . Financial resource strain: Not on file  . Food insecurity:    Worry: Not on file    Inability: Not on file  . Transportation needs:    Medical: Not on file    Non-medical: Not on file  Tobacco Use  . Smoking status: Never Smoker  . Smokeless tobacco: Never Used  Substance and Sexual Activity  . Alcohol use: Yes    Alcohol/week: 1.0 standard drinks    Types: 1 Cans of beer per week  . Drug use:  No  . Sexual activity: Yes  Lifestyle  . Physical activity:    Days per week: Not on file    Minutes per session: Not on file  . Stress: Not on file  Relationships  . Social connections:    Talks on phone: Not on file    Gets together: Not on file    Attends religious service: Not on file    Active member of club or organization: Not on file    Attends meetings of clubs or organizations: Not on file    Relationship status: Not on file  Other Topics Concern  . Not on file  Social History Narrative  . Not on file   Additional History: Married and lives with husband.   Assessment:   Musculoskeletal: Strength & Muscle Tone: within normal limits Gait & Station: normal Patient leans: N/A  Psychiatric Specialty Exam: Medication Refill   Anxiety  Symptoms include insomnia. Patient reports no nervous/anxious behavior.    Depression         Associated symptoms include insomnia.  Past medical  history includes anxiety.     Review of Systems  Constitutional: Positive for weight loss.  HENT: Negative for nosebleeds.   Eyes: Negative for photophobia.  Cardiovascular: Negative for orthopnea.  Musculoskeletal: Positive for back pain.  Skin: Negative for itching.  Neurological: Negative for sensory change.  Endo/Heme/Allergies: Negative for environmental allergies.  Psychiatric/Behavioral: Positive for depression. The patient has insomnia. The patient is not nervous/anxious.   All other systems reviewed and are negative.   There were no vitals taken for this visit.There is no height or weight on file to calculate BMI.  General Appearance: Casual  Eye Contact:  Fair  Speech:  Clear and Coherent and Normal Rate  Volume:  Normal  Mood:  Euthymic  Affect:  Congruent  Thought Process:  Goal Directed  Orientation:  Full (Time, Place, and Person)  Thought Content:  WDL  Suicidal Thoughts:  No  Homicidal Thoughts:  No  Memory:  Immediate;   Fair  Judgement:  Fair  Insight:  Fair  Psychomotor Activity:  Normal  Concentration:  Fair  Recall:  [employer] of Knowledge: Fair  Language: Fair  Akathisia:  No  Handed:  Right  AIMS (if indicated):    Assets:  Communication Skills Desire for Improvement Physical Health Social Support  ADL's:  Intact  Cognition: WNL  Sleep:     Is the patient at risk to self?  No. Has the patient been a risk to self in the past 6 months?  No. Has the patient been a risk to self within the distant past?  No. Is the patient a risk to others?  No. Has the patient been a risk to others in the past 6 months?  No. Has the patient been a risk to others within the distant past?  No.  Current Medications: Current Outpatient Medications  Medication Sig Dispense Refill  . b complex vitamins tablet Take 1 tablet by mouth daily.    Marland Kitchen BIOTIN PO Take by mouth.    . Cholecalciferol (VITAMIN D PO) Take by mouth.    . DULoxetine (CYMBALTA) 30 MG capsule  Take 1 capsule (30 mg total) by mouth daily. 30 capsule 2  . DULoxetine (CYMBALTA) 60 MG capsule Take 1 capsule (60 mg total) by mouth daily. 90 capsule 2  . Multiple Vitamin (MULTIVITAMIN) capsule Take 1 capsule by mouth daily.    . rosuvastatin (CRESTOR) 20 MG tablet     .  SYNTHROID 100 MCG tablet      No current facility-administered medications for this visit.     Medical Decision Making:  Review of Psycho-Social Stressors (1) and Review and summation of old records (2)  Treatment Plan Summary:Medication management   Depression  Cymbalta 30 mg p.o. every morning Start her on Lexapro 5 mg daily for 1 week and then titrate to 10 mg p.o. daily.  she agreed with the plan.  .  Discussed with her about the side effects in detail and she demonstrated understanding.  She will follow-up in 6 weeks or earlier depending on her symptoms   More than 50% of the time spent in psychoeducation, counseling and coordination of care.    This note was generated in part or whole with voice recognition software. Voice regonition is usually quite accurate but there are transcription errors that can and very often do occur. I apologize for any typographical errors that were not detected and corrected.   Rainey Pines, MD  12/17/2017, 2:18 PM

## 2018-01-02 ENCOUNTER — Ambulatory Visit: Payer: 59 | Admitting: Orthotics

## 2018-01-02 DIAGNOSIS — M722 Plantar fascial fibromatosis: Secondary | ICD-10-CM

## 2018-01-02 NOTE — Progress Notes (Signed)
Patient came in today to pick up custom made foot orthotics.  The goals were accomplished and the patient reported no dissatisfaction with said orthotics.  Patient was advised of breakin period and how to report any issues. 

## 2018-01-09 ENCOUNTER — Other Ambulatory Visit: Payer: Self-pay | Admitting: Psychiatry

## 2018-01-11 ENCOUNTER — Ambulatory Visit: Payer: 59 | Admitting: Podiatry

## 2018-02-01 ENCOUNTER — Other Ambulatory Visit: Payer: Self-pay | Admitting: Psychiatry

## 2018-02-02 ENCOUNTER — Other Ambulatory Visit: Payer: Self-pay | Admitting: Psychiatry

## 2018-02-04 ENCOUNTER — Ambulatory Visit: Payer: 59 | Admitting: Psychiatry

## 2018-02-04 ENCOUNTER — Encounter: Payer: Self-pay | Admitting: Psychiatry

## 2018-02-04 ENCOUNTER — Other Ambulatory Visit: Payer: Self-pay

## 2018-02-04 VITALS — BP 145/89 | HR 71 | Temp 98.1°F | Wt 194.4 lb

## 2018-02-04 DIAGNOSIS — F331 Major depressive disorder, recurrent, moderate: Secondary | ICD-10-CM | POA: Diagnosis not present

## 2018-02-04 DIAGNOSIS — F411 Generalized anxiety disorder: Secondary | ICD-10-CM

## 2018-02-04 MED ORDER — ZOLPIDEM TARTRATE 5 MG PO TABS: 5.0000 mg | ORAL_TABLET | Freq: Every evening | ORAL | 0 refills | Status: DC | PRN

## 2018-02-04 MED ORDER — ESCITALOPRAM OXALATE 10 MG PO TABS: 10.0000 mg | ORAL_TABLET | Freq: Every day | ORAL | 1 refills | Status: DC

## 2018-02-04 NOTE — Progress Notes (Signed)
BH MD/PA/NP OP Progress Note  02/04/2018 3:31 PM Shannon Crawford  MRN:  [id number]  Subjective:   Pt  is a 62 year-old married female  who presented for the follow-up appointment. She reported that she is noticed  improvement in her symptoms she was started on Lexapro.  She reported that she is doing much better and does not have any side effects of the medication.  She is willing to taper down on the Cymbalta and will stop the medication gradually.  Patient is planning to go to [place] for the next 4 months during the winter with her husband.  She will come back in 4 months.  Patient reported that she is having problems with sleep and wants to take as needed medication for the same.  She reported that she is doing well on the current medications.  No acute symptoms noted at this time.  She denied having any suicidal homicidal ideations or plans.  We discussed about her medications in detail and she agreed with the plan.  She appeared receptive to her medications at this time.      .   Patient appeared calm and alert during the interview.  She does not have any mood swings anger or anxiety at this time.          Chief Complaint:  Chief Complaint    Follow-up; Medication Refill     Visit Diagnosis:     ICD-10-CM   1. MDD (major depressive disorder), recurrent episode, moderate (HCC) F33.1   2. Anxiety state F41.1     Past Medical History:  Past Medical History:  Diagnosis Date  . Anxiety   . Fracture    ankle  . Heart murmur   . Hypothyroid   . Thyroid disease     Past Surgical History:  Procedure Laterality Date  . ABDOMINAL SURGERY     tummy tuck  . BREAST BIOPSY Left 10+ years   Negative- core bx   Family History:  Family History  Problem Relation Age of Onset  . Hypertension Mother   . Alcohol abuse Father   . Anxiety disorder Sister   . Prostate cancer Brother   . Hypertension Sister   . Breast cancer Sister 68  . Breast cancer Cousin    Social History:   Social History   Socioeconomic History  . Marital status: Married    Spouse name: Not on file  . Number of children: Not on file  . Years of education: Not on file  . Highest education level: Not on file  Occupational History  . Not on file  Social Needs  . Financial resource strain: Not on file  . Food insecurity:    Worry: Not on file    Inability: Not on file  . Transportation needs:    Medical: Not on file    Non-medical: Not on file  Tobacco Use  . Smoking status: Never Smoker  . Smokeless tobacco: Never Used  Substance and Sexual Activity  . Alcohol use: Yes    Alcohol/week: 1.0 standard drinks    Types: 1 Cans of beer per week  . Drug use: No  . Sexual activity: Yes  Lifestyle  . Physical activity:    Days per week: Not on file    Minutes per session: Not on file  . Stress: Not on file  Relationships  . Social connections:    Talks on phone: Not on file    Gets together: Not on file  Attends religious service: Not on file    Active member of club or organization: Not on file    Attends meetings of clubs or organizations: Not on file    Relationship status: Not on file  Other Topics Concern  . Not on file  Social History Narrative  . Not on file   Additional History: Married and lives with husband.   Assessment:   Musculoskeletal: Strength & Muscle Tone: within normal limits Gait & Station: normal Patient leans: N/A  Psychiatric Specialty Exam: Medication Refill   Anxiety  Symptoms include insomnia. Patient reports no nervous/anxious behavior.    Depression         Associated symptoms include insomnia.  Past medical history includes anxiety.     Review of Systems  Constitutional: Positive for weight loss.  HENT: Negative for nosebleeds.   Eyes: Negative for photophobia.  Cardiovascular: Negative for orthopnea.  Musculoskeletal: Positive for back pain.  Skin: Negative for itching.  Neurological: Negative for sensory change.   Endo/Heme/Allergies: Negative for environmental allergies.  Psychiatric/Behavioral: Positive for depression. The patient has insomnia. The patient is not nervous/anxious.   All other systems reviewed and are negative.   Blood pressure (!) 145/89, pulse 71, temperature 98.1 F (36.7 C), temperature source Oral, weight 194 lb 6.4 oz (88.2 kg).Body mass index is 35.56 kg/m.  General Appearance: Casual  Eye Contact:  Fair  Speech:  Clear and Coherent and Normal Rate  Volume:  Normal  Mood:  Euthymic  Affect:  Congruent  Thought Process:  Goal Directed  Orientation:  Full (Time, Place, and Person)  Thought Content:  WDL  Suicidal Thoughts:  No  Homicidal Thoughts:  No  Memory:  Immediate;   Fair  Judgement:  Fair  Insight:  Fair  Psychomotor Activity:  Normal  Concentration:  Fair  Recall:  [employer] of Knowledge: Fair  Language: Fair  Akathisia:  No  Handed:  Right  AIMS (if indicated):    Assets:  Communication Skills Desire for Improvement Physical Health Social Support  ADL's:  Intact  Cognition: WNL  Sleep:     Is the patient at risk to self?  No. Has the patient been a risk to self in the past 6 months?  No. Has the patient been a risk to self within the distant past?  No. Is the patient a risk to others?  No. Has the patient been a risk to others in the past 6 months?  No. Has the patient been a risk to others within the distant past?  No.  Current Medications: Current Outpatient Medications  Medication Sig Dispense Refill  . b complex vitamins tablet Take 1 tablet by mouth daily.    Marland Kitchen BIOTIN PO Take by mouth.    . Cholecalciferol (VITAMIN D PO) Take by mouth.    . DULoxetine (CYMBALTA) 30 MG capsule Take 1 capsule (30 mg total) by mouth daily. 30 capsule 2  . escitalopram (LEXAPRO) 10 MG tablet Take 1 tablet (10 mg total) by mouth daily. Take 1/2 pill at noon x 1 week and then 1 pill daily 90 tablet 1  . Multiple Vitamin (MULTIVITAMIN) capsule Take 1 capsule  by mouth daily.    . rosuvastatin (CRESTOR) 20 MG tablet     . SYNTHROID 100 MCG tablet     . zolpidem (AMBIEN) 5 MG tablet Take 1 tablet (5 mg total) by mouth at bedtime as needed for sleep. 30 tablet 0   No current facility-administered medications for  this visit.     Medical Decision Making:  Review of Psycho-Social Stressors (1) and Review and summation of old records (2)  Treatment Plan Summary:Medication management   Depression  Cymbalta 30 mg p.o. every morning.  She will take the medication on alternate days and will stop in 1 week.  Lexapro  10 mg p.o. daily.  Ambien 5 mg po qhs prn for sleep.  she agreed with the plan. Follow up in 4 months.  .  Discussed with her about the side effects in detail and she demonstrated understanding.    More than 50% of the time spent in psychoeducation, counseling and coordination of care.    This note was generated in part or whole with voice recognition software. Voice regonition is usually quite accurate but there are transcription errors that can and very often do occur. I apologize for any typographical errors that were not detected and corrected.   Rainey Pines, MD  02/04/2018, 3:31 PM

## 2018-05-01 ENCOUNTER — Telehealth: Payer: Self-pay

## 2018-05-01 NOTE — Telephone Encounter (Signed)
could not leave a message

## 2018-05-01 NOTE — Telephone Encounter (Signed)
pt called left a message that she neededed refills

## 2018-05-01 NOTE — Telephone Encounter (Signed)
left a message for patient to call office an make an appt to be seen has not been seen since december.

## 2018-05-02 NOTE — Telephone Encounter (Signed)
no answer no message could be left.  

## 2018-05-02 NOTE — Telephone Encounter (Signed)
left message that patient will need an appt to be seen has not been seen since december

## 2018-05-07 NOTE — Telephone Encounter (Signed)
spoke with patient she states she will not get back into town until april pt was told we didnt do out of states rx she states she can get rx transfereed.

## 2018-05-08 ENCOUNTER — Telehealth: Payer: Self-pay | Admitting: Psychiatry

## 2018-05-08 MED ORDER — ZOLPIDEM TARTRATE 5 MG PO TABS: 5.0000 mg | ORAL_TABLET | Freq: Every evening | ORAL | 0 refills | Status: DC | PRN

## 2018-05-08 NOTE — Telephone Encounter (Signed)
She has enough Lexapro per med list review. Will send Ambien to her [facility] as per on file.

## 2018-10-14 ENCOUNTER — Other Ambulatory Visit: Payer: Self-pay | Admitting: Internal Medicine

## 2018-10-14 DIAGNOSIS — Z1231 Encounter for screening mammogram for malignant neoplasm of breast: Secondary | ICD-10-CM

## 2018-11-19 ENCOUNTER — Ambulatory Visit
Admission: RE | Admit: 2018-11-19 | Discharge: 2018-11-19 | Disposition: A | Discharge status: Home / Self Care | Payer: Medicare HMO | Source: Ambulatory Visit | Attending: Internal Medicine | Admitting: Internal Medicine

## 2018-11-19 DIAGNOSIS — Z1231 Encounter for screening mammogram for malignant neoplasm of breast: Secondary | ICD-10-CM | POA: Diagnosis not present

## 2018-11-21 ENCOUNTER — Other Ambulatory Visit: Payer: Self-pay | Admitting: Internal Medicine

## 2018-11-21 DIAGNOSIS — N6489 Other specified disorders of breast: Secondary | ICD-10-CM

## 2018-11-21 DIAGNOSIS — R928 Other abnormal and inconclusive findings on diagnostic imaging of breast: Secondary | ICD-10-CM

## 2018-12-02 ENCOUNTER — Ambulatory Visit
Admission: RE | Admit: 2018-12-02 | Discharge: 2018-12-02 | Disposition: A | Discharge status: Home / Self Care | Payer: Medicare HMO | Source: Ambulatory Visit | Attending: Internal Medicine | Admitting: Internal Medicine

## 2018-12-02 DIAGNOSIS — R928 Other abnormal and inconclusive findings on diagnostic imaging of breast: Secondary | ICD-10-CM

## 2018-12-02 DIAGNOSIS — N6489 Other specified disorders of breast: Secondary | ICD-10-CM | POA: Diagnosis present

## 2018-12-03 ENCOUNTER — Other Ambulatory Visit: Payer: Self-pay | Admitting: Internal Medicine

## 2018-12-03 DIAGNOSIS — R928 Other abnormal and inconclusive findings on diagnostic imaging of breast: Secondary | ICD-10-CM

## 2018-12-03 DIAGNOSIS — N6489 Other specified disorders of breast: Secondary | ICD-10-CM

## 2019-07-18 ENCOUNTER — Other Ambulatory Visit: Payer: Self-pay

## 2019-07-23 ENCOUNTER — Other Ambulatory Visit: Payer: Medicare HMO

## 2019-07-24 ENCOUNTER — Ambulatory Visit
Admission: RE | Admit: 2019-07-24 | Discharge: 2019-07-24 | Disposition: A | Discharge status: Home / Self Care | Payer: Medicare HMO | Source: Ambulatory Visit | Attending: Internal Medicine | Admitting: Internal Medicine

## 2019-07-24 DIAGNOSIS — R928 Other abnormal and inconclusive findings on diagnostic imaging of breast: Secondary | ICD-10-CM

## 2019-07-24 DIAGNOSIS — N6489 Other specified disorders of breast: Secondary | ICD-10-CM

## 2019-08-05 ENCOUNTER — Other Ambulatory Visit: Payer: Self-pay

## 2019-08-05 ENCOUNTER — Ambulatory Visit (INDEPENDENT_AMBULATORY_CARE_PROVIDER_SITE_OTHER): Payer: Medicare HMO | Admitting: Dermatology

## 2019-08-05 DIAGNOSIS — L80 Vitiligo: Secondary | ICD-10-CM

## 2019-08-05 DIAGNOSIS — D492 Neoplasm of unspecified behavior of bone, soft tissue, and skin: Secondary | ICD-10-CM

## 2019-08-05 DIAGNOSIS — L659 Nonscarring hair loss, unspecified: Secondary | ICD-10-CM | POA: Diagnosis not present

## 2019-08-05 DIAGNOSIS — D485 Neoplasm of uncertain behavior of skin: Secondary | ICD-10-CM | POA: Diagnosis not present

## 2019-08-05 DIAGNOSIS — L988 Other specified disorders of the skin and subcutaneous tissue: Secondary | ICD-10-CM

## 2019-08-05 MED ORDER — BIMATOPROST 0.03 % EX SOLN: 1.0000 "application " | Freq: Every day | CUTANEOUS | 12 refills | Status: DC

## 2019-08-05 MED ORDER — TACROLIMUS 0.1 % EX OINT: TOPICAL_OINTMENT | Freq: Every day | CUTANEOUS | 3 refills | Status: DC

## 2019-08-05 NOTE — Patient Instructions (Signed)
Recommend taking vitamin A, vitamin C, and Vitamin E for the Vitiligo

## 2019-08-05 NOTE — Progress Notes (Signed)
   Follow-Up Visit   Subjective  Shannon Crawford is a 64 y.o. female who presents for the following: Facial Elastosis (face, pt presents for Botox today), Irritating moles (face, irritated by glasses, pt feels getting larger), and bump (nose, pt picked at a pimple >74yr).   The following portions of the chart were reviewed this encounter and updated as appropriate:  Tobacco  Allergies  Meds  Problems  Med Hx  Surg Hx  Fam Hx      Review of Systems:  No other skin or systemic complaints except as noted in HPI or Assessment and Plan.  Objective  Well appearing patient in no apparent distress; mood and affect are within normal limits.  A focused examination was performed including face. Relevant physical exam findings are noted in the Assessment and Plan.  Objective  face: Rhytides and volume loss.   Images                    Objective  face, arms, legs, neck, chest: Depigmentation  Objective  R medial infraorbital: Brown pap  Objective  R nasal tip: Flesh pap  Objective  Anterior midline chin: Flesh pap  Objective  eyelashes: Thinning of eyelashes   Assessment & Plan  Elastosis of skin face  Botox 55 units injected today as marked: - Frown Complex 25 units - Brow Lift 5 units each side, total of 10 units - Crows feet 10 units each side, total of 20 units  Botox Injection - face Location: Frown complex, brow lift bil, crows feet bil  Informed consent: Discussed risks (infection, pain, bleeding, bruising, swelling, allergic reaction, paralysis of nearby muscles, eyelid droop, double vision, neck weakness, difficulty breathing, headache, undesirable cosmetic result, and need for additional treatment) and benefits of the procedure, as well as the alternatives.  Informed consent was obtained.  Preparation: The area was cleansed with alcohol.  Procedure Details:  Botox was injected into the dermis with a 30-gauge needle. Pressure applied to  any bleeding. Ice packs offered for swelling.  Lot Number:  [id number] Expiration:  10/2021  Total Units Injected:  55  Plan: Patient was instructed to remain upright for 4 hours. Patient was instructed to avoid massaging the face and avoid vigorous exercise for the rest of the day. Tylenol may be used for headache.  Allow 2 weeks before returning to clinic for additional dosing as needed. Patient will call for any problems.   Vitiligo face, arms, legs, neck, chest  Worsened per patient  Start Vit A, Vit C, Vit E Restart Protopic 0.1% oint qhs  Discussed Xeljanz  tacrolimus (PROTOPIC) 0.1 % ointment - face, arms, legs, neck, chest  Neoplasm of skin (3) R medial infraorbital  R nasal tip  Anterior midline chin  Irritated Nevus r/o Dysplasia plan shave removal x 3 on f/u  Hypotrichosis eyelashes  Cont Latisse (Bimatoprost) qhs as directed  bimatoprost (LATISSE) 0.03 % ophthalmic solution - eyelashes  Return for 2-4wks recheck botox and do bxs.  I, Othelia Pulling, RMA, am acting as scribe for Sarina Ser, MD .   Documentation: I have reviewed the above documentation for accuracy and completeness, and I agree with the above.  Sarina Ser, MD

## 2019-08-06 ENCOUNTER — Encounter: Payer: Self-pay | Admitting: Dermatology

## 2019-08-14 DIAGNOSIS — M542 Cervicalgia: Secondary | ICD-10-CM | POA: Insufficient documentation

## 2019-08-14 DIAGNOSIS — R200 Anesthesia of skin: Secondary | ICD-10-CM | POA: Insufficient documentation

## 2019-08-14 DIAGNOSIS — R202 Paresthesia of skin: Secondary | ICD-10-CM | POA: Insufficient documentation

## 2019-08-14 DIAGNOSIS — M25511 Pain in right shoulder: Secondary | ICD-10-CM | POA: Insufficient documentation

## 2019-09-10 ENCOUNTER — Ambulatory Visit: Payer: Medicare HMO | Admitting: Dermatology

## 2019-09-10 ENCOUNTER — Other Ambulatory Visit: Payer: Self-pay

## 2019-09-10 DIAGNOSIS — D2239 Melanocytic nevi of other parts of face: Secondary | ICD-10-CM | POA: Diagnosis not present

## 2019-09-10 DIAGNOSIS — D485 Neoplasm of uncertain behavior of skin: Secondary | ICD-10-CM | POA: Diagnosis not present

## 2019-09-10 DIAGNOSIS — L988 Other specified disorders of the skin and subcutaneous tissue: Secondary | ICD-10-CM

## 2019-09-10 NOTE — Patient Instructions (Signed)

## 2019-09-10 NOTE — Progress Notes (Signed)
Follow-Up Visit   Subjective  Shannon Crawford is a 64 y.o. female who presents for the following: Facial Elastosis (patient is here today to recheck her Botox ) and irritated nevi (patient is here today for biopsies).  The following portions of the chart were reviewed this encounter and updated as appropriate:  Tobacco  Allergies  Meds  Problems  Med Hx  Surg Hx  Fam Hx     Review of Systems:  No other skin or systemic complaints except as noted in HPI or Assessment and Plan.  Objective  Well appearing patient in no apparent distress; mood and affect are within normal limits.  A focused examination was performed including the face. Relevant physical exam findings are noted in the Assessment and Plan.  Objective  Face: Rhytides and volume loss.   Images              Objective  R med infra orbital: 0.7 cm brown papule  Objective  R nasal tip: 0.4 cm brown papule   Objective  ant midline chin: 0.5 cm brown papule  Assessment & Plan    Elastosis of skin Face  Discussed adding an additional 7.5 units to the frown complex.   Botox 7.5 units injected into: - 2.5 units into the R lat corrugator  - 2.5 units into the L lat corrugator - 2.5 units into the glabella  Botox Injection - Face Location: See attached image  Informed consent: Discussed risks (infection, pain, bleeding, bruising, swelling, allergic reaction, paralysis of nearby muscles, eyelid droop, double vision, neck weakness, difficulty breathing, headache, undesirable cosmetic result, and need for additional treatment) and benefits of the procedure, as well as the alternatives.  Informed consent was obtained.  Preparation: The area was cleansed with alcohol.  Procedure Details:  Botox was injected into the dermis with a 30-gauge needle. Pressure applied to any bleeding. Ice packs offered for swelling.  Lot Number:  [id number] Expiration:  12/2021  Total Units Injected:  7.5  Plan:  Patient was instructed to remain upright for 4 hours. Patient was instructed to avoid massaging the face and avoid vigorous exercise for the rest of the day. Tylenol may be used for headache.  Allow 2 weeks before returning to clinic for additional dosing as needed. Patient will call for any problems.   Neoplasm of uncertain behavior of skin (3) R med infra orbital  Epidermal / dermal shaving  Lesion diameter (cm):  0.7 Informed consent: discussed and consent obtained   Timeout: patient name, date of birth, surgical site, and procedure verified   Procedure prep:  Patient was prepped and draped in usual sterile fashion Prep type:  Isopropyl alcohol Anesthesia: the lesion was anesthetized in a standard fashion   Anesthetic:  1% lidocaine w/ epinephrine 1-100,000 buffered w/ 8.4% NaHCO3 Instrument used: flexible razor blade   Hemostasis achieved with: pressure, aluminum chloride and electrodesiccation   Outcome: patient tolerated procedure well   Post-procedure details: sterile dressing applied and wound care instructions given   Dressing type: bandage and petrolatum    Specimen 1 - Surgical pathology Differential Diagnosis: D48.5 irritated nevus r/o dysplasia Check Margins: No 0.7 cm brown papule  R nasal tip  Epidermal / dermal shaving  Lesion diameter (cm):  0.4 Informed consent: discussed and consent obtained   Timeout: patient name, date of birth, surgical site, and procedure verified   Procedure prep:  Patient was prepped and draped in usual sterile fashion Prep type:  Isopropyl alcohol Anesthesia: the lesion  was anesthetized in a standard fashion   Anesthetic:  1% lidocaine w/ epinephrine 1-100,000 buffered w/ 8.4% NaHCO3 Instrument used: flexible razor blade   Hemostasis achieved with: pressure, aluminum chloride and electrodesiccation   Outcome: patient tolerated procedure well   Post-procedure details: sterile dressing applied and wound care instructions given     Dressing type: bandage and petrolatum    Specimen 2 - Surgical pathology Differential Diagnosis: D48.5 irritated nevus r/o dysplasia Check Margins: No 0.4 cm brown papule  ant midline chin  Epidermal / dermal shaving  Lesion diameter (cm):  0.5 Informed consent: discussed and consent obtained   Timeout: patient name, date of birth, surgical site, and procedure verified   Procedure prep:  Patient was prepped and draped in usual sterile fashion Prep type:  Isopropyl alcohol Anesthesia: the lesion was anesthetized in a standard fashion   Anesthetic:  1% lidocaine w/ epinephrine 1-100,000 buffered w/ 8.4% NaHCO3 Instrument used: flexible razor blade   Hemostasis achieved with: pressure, aluminum chloride and electrodesiccation   Outcome: patient tolerated procedure well   Post-procedure details: sterile dressing applied and wound care instructions given   Dressing type: bandage and petrolatum    Specimen 3 - Surgical pathology Differential Diagnosis: D48.5 irritated nevus r/o dysplasia Check Margins: No 0.5 cm brown papule  Return in about 3 months (around 12/11/2019) for cosmetic - Botox.  Luther Redo, CMA, am acting as scribe for Sarina Ser, MD .  Documentation: I have reviewed the above documentation for accuracy and completeness, and I agree with the above.  Sarina Ser, MD

## 2019-09-13 ENCOUNTER — Encounter: Payer: Self-pay | Admitting: Dermatology

## 2019-09-15 ENCOUNTER — Telehealth: Payer: Self-pay

## 2019-09-15 NOTE — Telephone Encounter (Signed)
Patient informed of pathology results 

## 2019-09-15 NOTE — Telephone Encounter (Signed)
-----   Message from Ralene Bathe, MD sent at 09/11/2019  6:43 PM EDT ----- 1. Skin , right med infro orbital MELANOCYTIC NEVUS, INTRADERMAL TYPE, IRRITATED 2. Skin , right nasal tip FIBROUS PAPULE 3. Skin , ant midline chin MELANOCYTIC NEVUS, INTRADERMAL TYPE, WITH FOCAL SCAR, SEE DESCRIPTION  1,2,3 - all 3 benign "mole"

## 2019-12-16 ENCOUNTER — Ambulatory Visit: Payer: Medicare HMO | Admitting: Dermatology

## 2019-12-25 ENCOUNTER — Other Ambulatory Visit: Payer: Self-pay | Admitting: Internal Medicine

## 2019-12-25 DIAGNOSIS — N6489 Other specified disorders of breast: Secondary | ICD-10-CM

## 2019-12-25 DIAGNOSIS — Z1231 Encounter for screening mammogram for malignant neoplasm of breast: Secondary | ICD-10-CM

## 2020-01-26 ENCOUNTER — Ambulatory Visit
Admission: RE | Admit: 2020-01-26 | Discharge: 2020-01-26 | Disposition: A | Discharge status: Home / Self Care | Payer: Medicare HMO | Source: Ambulatory Visit | Attending: Internal Medicine | Admitting: Internal Medicine

## 2020-01-26 ENCOUNTER — Other Ambulatory Visit: Payer: Self-pay

## 2020-01-26 DIAGNOSIS — N6489 Other specified disorders of breast: Secondary | ICD-10-CM | POA: Insufficient documentation

## 2020-01-26 DIAGNOSIS — Z1231 Encounter for screening mammogram for malignant neoplasm of breast: Secondary | ICD-10-CM | POA: Diagnosis present

## 2020-03-13 DIAGNOSIS — G4733 Obstructive sleep apnea (adult) (pediatric): Secondary | ICD-10-CM | POA: Diagnosis not present

## 2020-03-19 DIAGNOSIS — Z20822 Contact with and (suspected) exposure to covid-19: Secondary | ICD-10-CM | POA: Diagnosis not present

## 2020-03-19 DIAGNOSIS — Z03818 Encounter for observation for suspected exposure to other biological agents ruled out: Secondary | ICD-10-CM | POA: Diagnosis not present

## 2020-03-26 DIAGNOSIS — Z20822 Contact with and (suspected) exposure to covid-19: Secondary | ICD-10-CM | POA: Diagnosis not present

## 2020-03-26 DIAGNOSIS — Z03818 Encounter for observation for suspected exposure to other biological agents ruled out: Secondary | ICD-10-CM | POA: Diagnosis not present

## 2020-04-13 DIAGNOSIS — G4733 Obstructive sleep apnea (adult) (pediatric): Secondary | ICD-10-CM | POA: Diagnosis not present

## 2020-05-04 DIAGNOSIS — E039 Hypothyroidism, unspecified: Secondary | ICD-10-CM | POA: Diagnosis not present

## 2020-05-04 DIAGNOSIS — E782 Mixed hyperlipidemia: Secondary | ICD-10-CM | POA: Diagnosis not present

## 2020-05-04 DIAGNOSIS — R7302 Impaired glucose tolerance (oral): Secondary | ICD-10-CM | POA: Diagnosis not present

## 2020-05-04 DIAGNOSIS — R030 Elevated blood-pressure reading, without diagnosis of hypertension: Secondary | ICD-10-CM | POA: Diagnosis not present

## 2020-05-06 DIAGNOSIS — I10 Essential (primary) hypertension: Secondary | ICD-10-CM | POA: Diagnosis not present

## 2020-05-06 DIAGNOSIS — E782 Mixed hyperlipidemia: Secondary | ICD-10-CM | POA: Diagnosis not present

## 2020-05-06 DIAGNOSIS — R7302 Impaired glucose tolerance (oral): Secondary | ICD-10-CM | POA: Diagnosis not present

## 2020-05-06 DIAGNOSIS — M8589 Other specified disorders of bone density and structure, multiple sites: Secondary | ICD-10-CM | POA: Diagnosis not present

## 2020-05-06 DIAGNOSIS — E039 Hypothyroidism, unspecified: Secondary | ICD-10-CM | POA: Diagnosis not present

## 2020-05-06 DIAGNOSIS — R69 Illness, unspecified: Secondary | ICD-10-CM | POA: Diagnosis not present

## 2020-05-06 DIAGNOSIS — G4733 Obstructive sleep apnea (adult) (pediatric): Secondary | ICD-10-CM | POA: Diagnosis not present

## 2020-05-11 DIAGNOSIS — G4733 Obstructive sleep apnea (adult) (pediatric): Secondary | ICD-10-CM | POA: Diagnosis not present

## 2020-06-11 DIAGNOSIS — G4733 Obstructive sleep apnea (adult) (pediatric): Secondary | ICD-10-CM | POA: Diagnosis not present

## 2020-06-15 DIAGNOSIS — Z79899 Other long term (current) drug therapy: Secondary | ICD-10-CM | POA: Diagnosis not present

## 2020-06-15 DIAGNOSIS — E039 Hypothyroidism, unspecified: Secondary | ICD-10-CM | POA: Diagnosis not present

## 2020-06-15 DIAGNOSIS — N958 Other specified menopausal and perimenopausal disorders: Secondary | ICD-10-CM | POA: Diagnosis not present

## 2020-06-15 DIAGNOSIS — I10 Essential (primary) hypertension: Secondary | ICD-10-CM | POA: Diagnosis not present

## 2020-06-15 DIAGNOSIS — R29890 Loss of height: Secondary | ICD-10-CM | POA: Diagnosis not present

## 2020-06-15 DIAGNOSIS — E782 Mixed hyperlipidemia: Secondary | ICD-10-CM | POA: Diagnosis not present

## 2020-07-11 DIAGNOSIS — G4733 Obstructive sleep apnea (adult) (pediatric): Secondary | ICD-10-CM | POA: Diagnosis not present

## 2020-07-22 DIAGNOSIS — H5213 Myopia, bilateral: Secondary | ICD-10-CM | POA: Diagnosis not present

## 2020-08-03 DIAGNOSIS — I10 Essential (primary) hypertension: Secondary | ICD-10-CM | POA: Diagnosis not present

## 2020-08-03 DIAGNOSIS — G8929 Other chronic pain: Secondary | ICD-10-CM | POA: Diagnosis not present

## 2020-08-03 DIAGNOSIS — M5441 Lumbago with sciatica, right side: Secondary | ICD-10-CM | POA: Diagnosis not present

## 2020-08-03 DIAGNOSIS — M4316 Spondylolisthesis, lumbar region: Secondary | ICD-10-CM | POA: Diagnosis not present

## 2020-08-03 DIAGNOSIS — Z6834 Body mass index (BMI) 34.0-34.9, adult: Secondary | ICD-10-CM | POA: Diagnosis not present

## 2020-08-05 ENCOUNTER — Other Ambulatory Visit: Payer: Self-pay | Admitting: Neurosurgery

## 2020-08-05 DIAGNOSIS — M4316 Spondylolisthesis, lumbar region: Secondary | ICD-10-CM

## 2020-08-11 DIAGNOSIS — G4733 Obstructive sleep apnea (adult) (pediatric): Secondary | ICD-10-CM | POA: Diagnosis not present

## 2020-08-16 DIAGNOSIS — I10 Essential (primary) hypertension: Secondary | ICD-10-CM | POA: Diagnosis not present

## 2020-08-16 DIAGNOSIS — E782 Mixed hyperlipidemia: Secondary | ICD-10-CM | POA: Diagnosis not present

## 2020-08-16 DIAGNOSIS — E039 Hypothyroidism, unspecified: Secondary | ICD-10-CM | POA: Diagnosis not present

## 2020-08-16 DIAGNOSIS — M5441 Lumbago with sciatica, right side: Secondary | ICD-10-CM | POA: Diagnosis not present

## 2020-08-16 DIAGNOSIS — R7302 Impaired glucose tolerance (oral): Secondary | ICD-10-CM | POA: Diagnosis not present

## 2020-08-17 ENCOUNTER — Other Ambulatory Visit: Payer: Self-pay

## 2020-08-17 ENCOUNTER — Ambulatory Visit
Admission: RE | Admit: 2020-08-17 | Discharge: 2020-08-17 | Disposition: A | Discharge status: Home / Self Care | Payer: Medicare HMO | Source: Ambulatory Visit | Attending: Neurosurgery | Admitting: Neurosurgery

## 2020-08-17 DIAGNOSIS — M545 Low back pain, unspecified: Secondary | ICD-10-CM | POA: Diagnosis not present

## 2020-08-17 DIAGNOSIS — M4316 Spondylolisthesis, lumbar region: Secondary | ICD-10-CM | POA: Diagnosis not present

## 2020-09-08 DIAGNOSIS — G4733 Obstructive sleep apnea (adult) (pediatric): Secondary | ICD-10-CM | POA: Diagnosis not present

## 2020-09-10 DIAGNOSIS — M5416 Radiculopathy, lumbar region: Secondary | ICD-10-CM | POA: Diagnosis not present

## 2020-09-10 DIAGNOSIS — M4316 Spondylolisthesis, lumbar region: Secondary | ICD-10-CM | POA: Diagnosis not present

## 2020-09-10 DIAGNOSIS — I10 Essential (primary) hypertension: Secondary | ICD-10-CM | POA: Diagnosis not present

## 2020-09-10 DIAGNOSIS — Z6834 Body mass index (BMI) 34.0-34.9, adult: Secondary | ICD-10-CM | POA: Diagnosis not present

## 2020-09-10 DIAGNOSIS — G4733 Obstructive sleep apnea (adult) (pediatric): Secondary | ICD-10-CM | POA: Diagnosis not present

## 2020-09-12 IMAGING — MG MM DIGITAL DIAGNOSTIC UNILAT*L* W/ TOMO W/ CAD
4 series · 4 of 12 positions shown · non-contrast
Comparison: Previous exam(s).

CLINICAL DATA: Patient was called back from screening mammogram for
a possible asymmetry in the left breast.

EXAM:
DIGITAL DIAGNOSTIC LEFT MAMMOGRAM WITH CAD AND TOMO
ULTRASOUND LEFT BREAST

[L CC synth-2D]
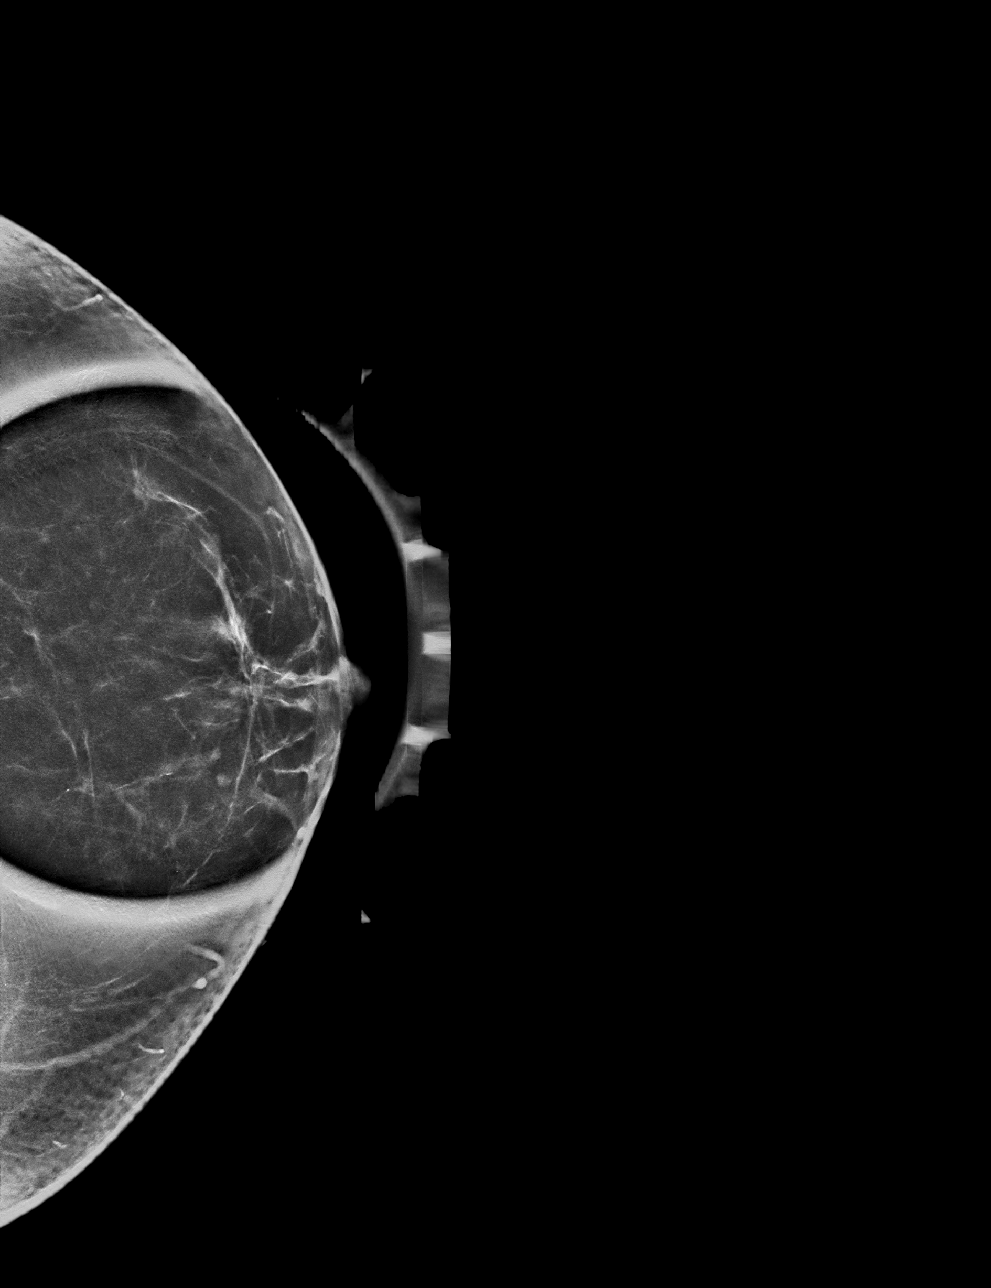

[L ML synth-2D]
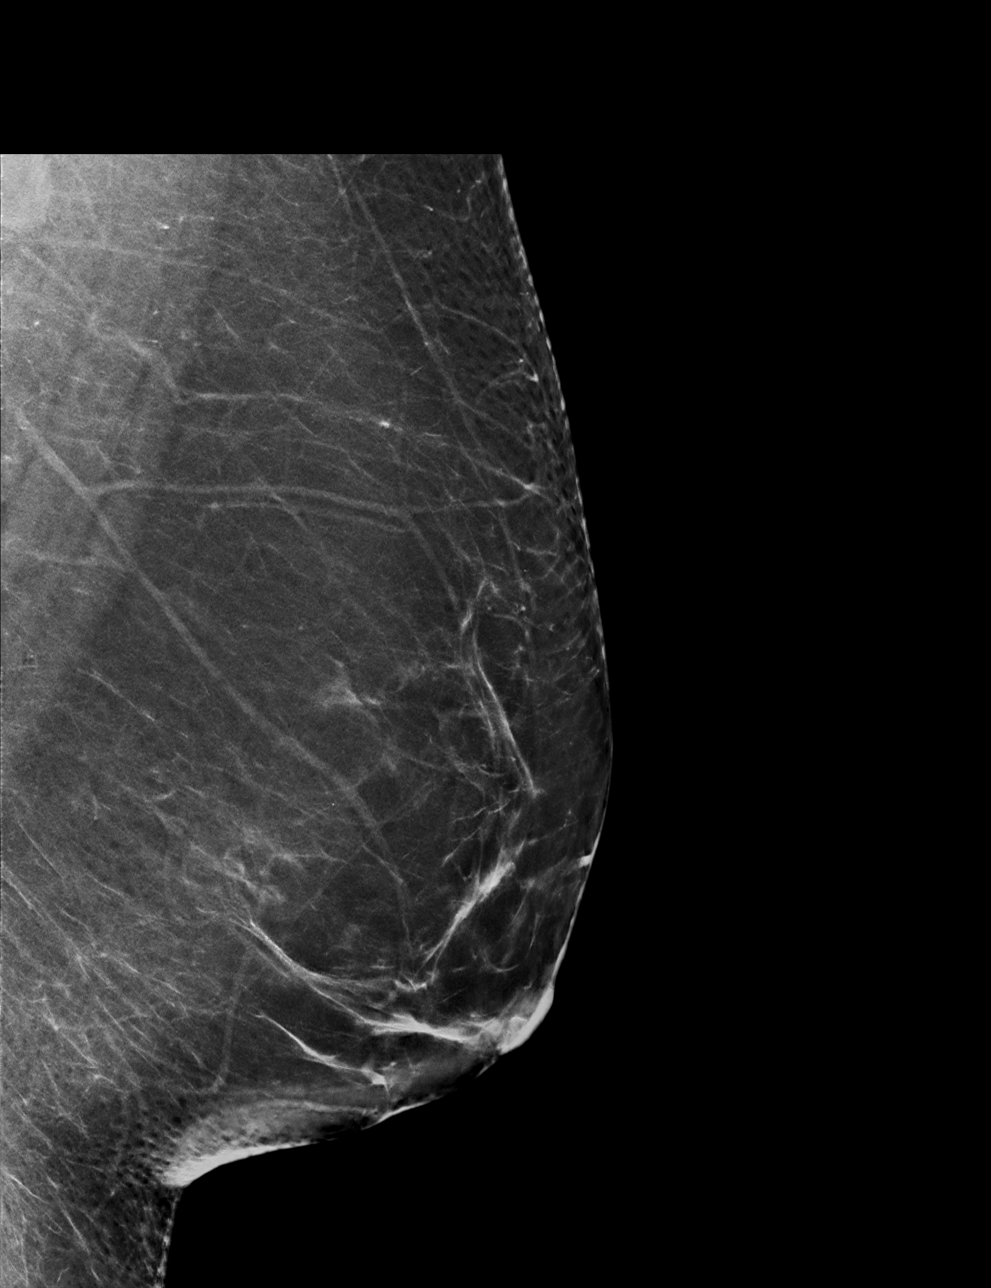

[L ML tomo · tomo slice 45/89.0]
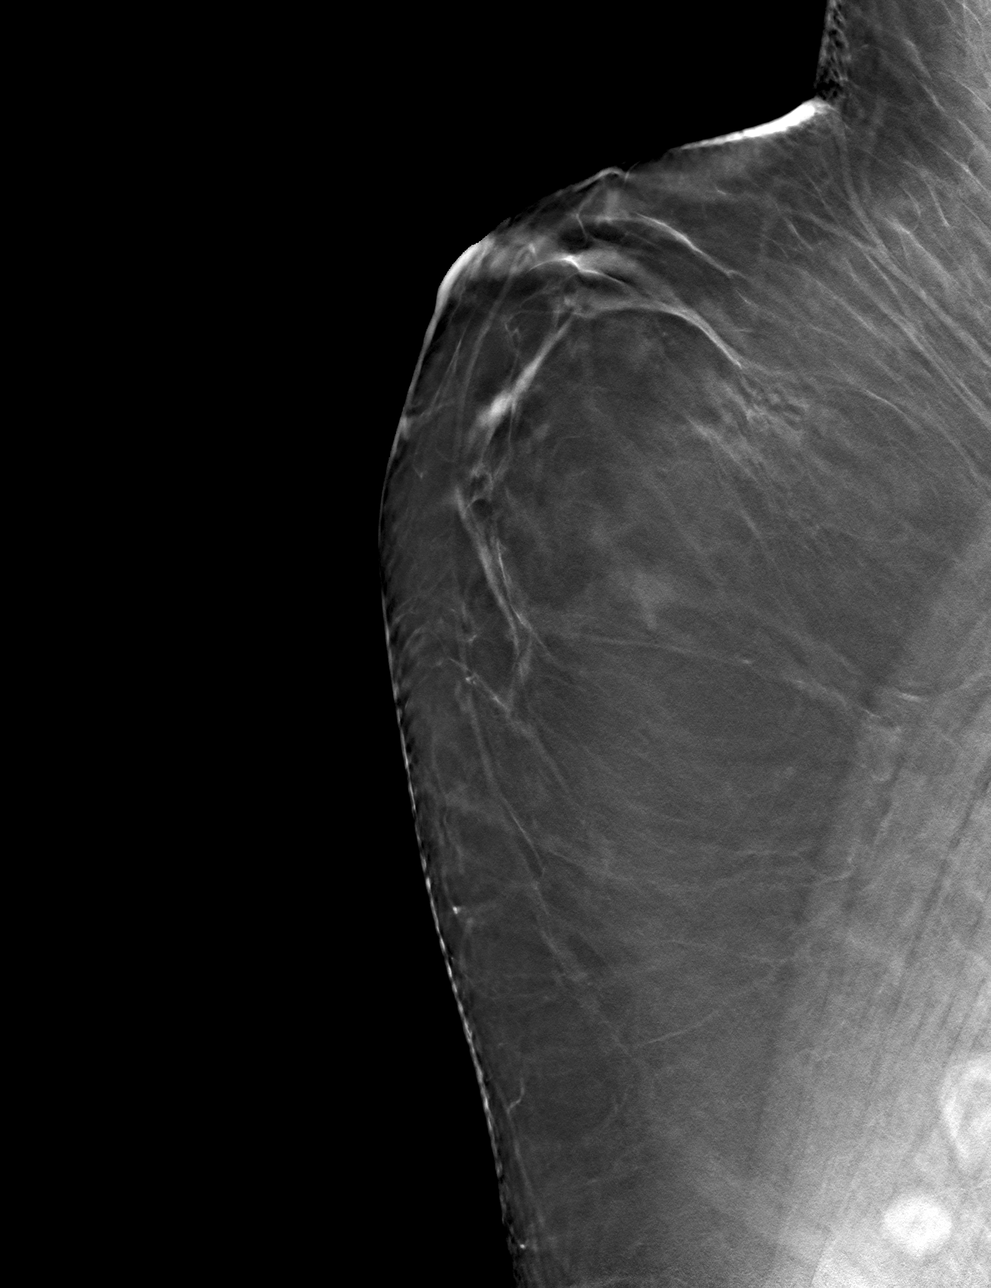

[L CC tomo · tomo slice 33/66.0]
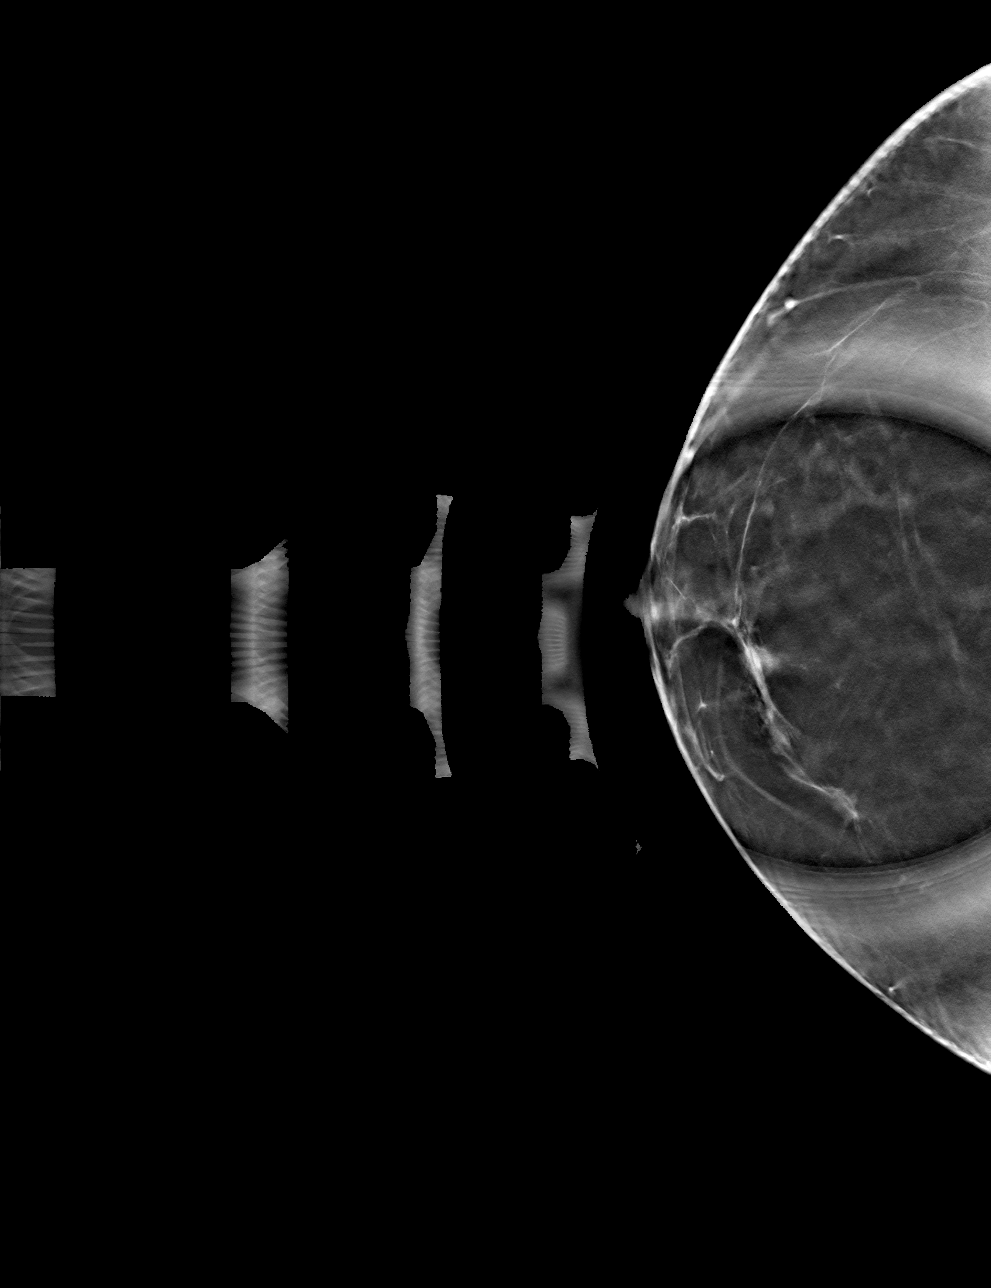

[4 of 12 positions shown; findings below may reference images not displayed]

ACR Breast Density Category b: There are scattered areas of
fibroglandular density.
FINDINGS: Additional imaging of the left breast was performed. There is focal
asymmetry in the lateral aspect of the left breast seen in the
inferior breast on the tomographic images. It is not seen on the
lateral view. It is likely normal asymmetric fibroglandular tissue.
There are no malignant type microcalcifications.

Mammographic images were processed with CAD.

Targeted ultrasound is performed, showing normal ducts in the 4
o'clock region of the left breast 1 cm from the nipple. No solid or
cystic mass, abnormal shadowing or distortion visualized.
IMPRESSION: Probable benign asymmetric tissue in the lateral aspect of the left
breast.

RECOMMENDATION:
Short-term interval follow-up left mammogram in 6 months is
recommended.

I have discussed the findings and recommendations with the patient.
If applicable, a reminder letter will be sent to the patient
regarding the next appointment.

BI-RADS CATEGORY  3: Probably benign.

## 2020-09-12 IMAGING — US US BREAST*L* LIMITED INC AXILLA
1 series · 3 of 3 positions shown · non-contrast
Comparison: Previous exam(s).

CLINICAL DATA: Patient was called back from screening mammogram for
a possible asymmetry in the left breast.

EXAM:
DIGITAL DIAGNOSTIC LEFT MAMMOGRAM WITH CAD AND TOMO
ULTRASOUND LEFT BREAST

[Series 1: us breast*left* limited inc axilla · 0.06mm/px · 3 of 3 slices shown]
[im 1/3]
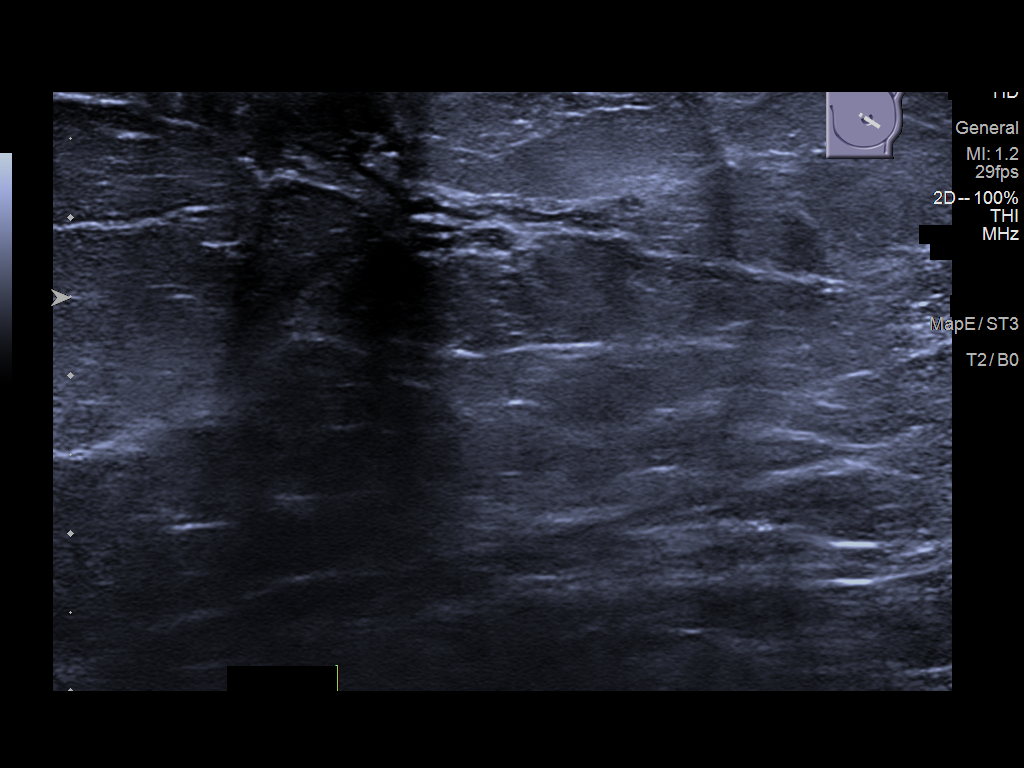
[im 2/3]
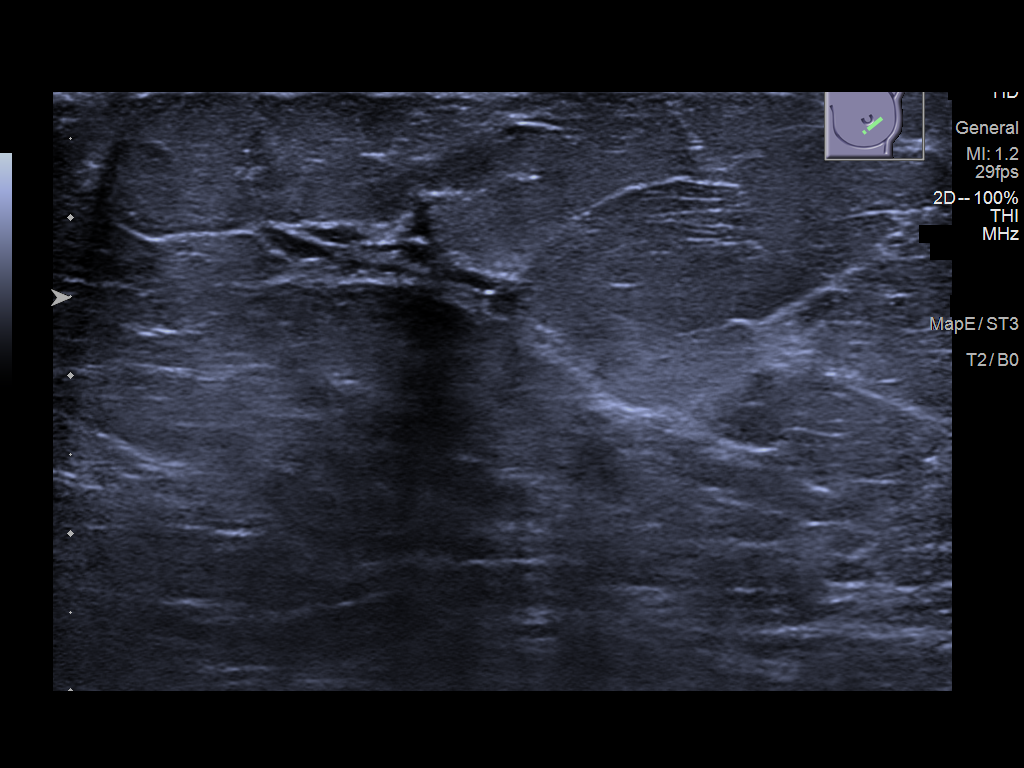
[im 3/3]
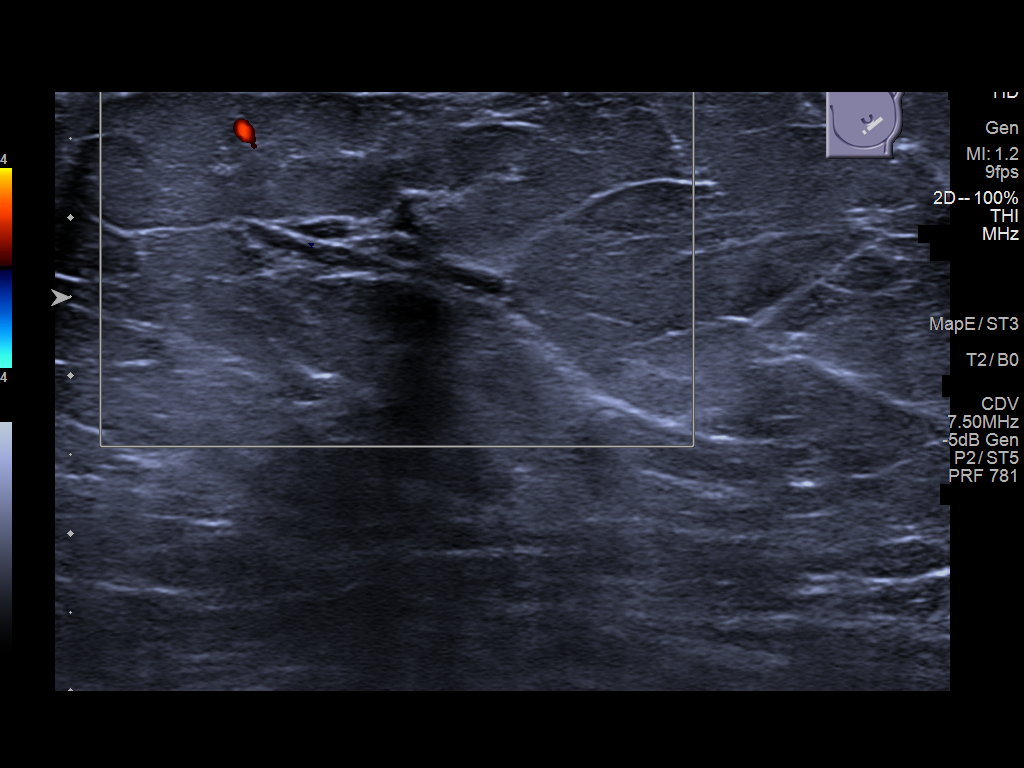

[3 of 3 positions shown; findings below may reference images not displayed]

ACR Breast Density Category b: There are scattered areas of
fibroglandular density.
FINDINGS: Additional imaging of the left breast was performed. There is focal
asymmetry in the lateral aspect of the left breast seen in the
inferior breast on the tomographic images. It is not seen on the
lateral view. It is likely normal asymmetric fibroglandular tissue.
There are no malignant type microcalcifications.

Mammographic images were processed with CAD.

Targeted ultrasound is performed, showing normal ducts in the 4
o'clock region of the left breast 1 cm from the nipple. No solid or
cystic mass, abnormal shadowing or distortion visualized.
IMPRESSION: Probable benign asymmetric tissue in the lateral aspect of the left
breast.

RECOMMENDATION:
Short-term interval follow-up left mammogram in 6 months is
recommended.

I have discussed the findings and recommendations with the patient.
If applicable, a reminder letter will be sent to the patient
regarding the next appointment.

BI-RADS CATEGORY  3: Probably benign.

## 2020-10-11 DIAGNOSIS — G4733 Obstructive sleep apnea (adult) (pediatric): Secondary | ICD-10-CM | POA: Diagnosis not present

## 2020-10-26 ENCOUNTER — Ambulatory Visit
Payer: Medicare HMO | Attending: Student in an Organized Health Care Education/Training Program | Admitting: Student in an Organized Health Care Education/Training Program

## 2020-10-26 ENCOUNTER — Encounter: Payer: Self-pay | Admitting: Student in an Organized Health Care Education/Training Program

## 2020-10-26 ENCOUNTER — Other Ambulatory Visit: Payer: Self-pay

## 2020-10-26 VITALS — BP 154/90 | HR 76 | Temp 96.8°F | Resp 18 | Ht 62.0 in | Wt 187.0 lb

## 2020-10-26 DIAGNOSIS — M51369 Other intervertebral disc degeneration, lumbar region without mention of lumbar back pain or lower extremity pain: Secondary | ICD-10-CM

## 2020-10-26 DIAGNOSIS — M5136 Other intervertebral disc degeneration, lumbar region: Secondary | ICD-10-CM | POA: Diagnosis not present

## 2020-10-26 DIAGNOSIS — G8929 Other chronic pain: Secondary | ICD-10-CM

## 2020-10-26 DIAGNOSIS — M47816 Spondylosis without myelopathy or radiculopathy, lumbar region: Secondary | ICD-10-CM | POA: Diagnosis not present

## 2020-10-26 DIAGNOSIS — F331 Major depressive disorder, recurrent, moderate: Secondary | ICD-10-CM

## 2020-10-26 DIAGNOSIS — R69 Illness, unspecified: Secondary | ICD-10-CM | POA: Diagnosis not present

## 2020-10-26 DIAGNOSIS — G5701 Lesion of sciatic nerve, right lower limb: Secondary | ICD-10-CM | POA: Diagnosis not present

## 2020-10-26 DIAGNOSIS — G894 Chronic pain syndrome: Secondary | ICD-10-CM | POA: Diagnosis not present

## 2020-10-26 DIAGNOSIS — M533 Sacrococcygeal disorders, not elsewhere classified: Secondary | ICD-10-CM | POA: Insufficient documentation

## 2020-10-26 DIAGNOSIS — M5416 Radiculopathy, lumbar region: Secondary | ICD-10-CM

## 2020-10-26 NOTE — Patient Instructions (Signed)
______________________________________________________________________  Preparing for Procedure with Sedation  NOTICE: Due to recent regulatory changes, starting on September 20, 2020, procedures requiring intravenous (IV) sedation will no longer be performed at the [facility].  These types of procedures are required to be performed at [facility] ambulatory surgery facility.  We are very sorry for the inconvenience.  Procedure appointments are limited to planned procedures: No Prescription Refills. No disability issues will be discussed. No medication changes will be discussed.  Instructions: Oral Intake: Do not eat or drink anything for at least 8 hours prior to your procedure. (Exception: Blood Pressure Medication. See below.) Transportation: A driver is required. You may not drive yourself after the procedure. Blood Pressure Medicine: Do not forget to take your blood pressure medicine with a sip of water the morning of the procedure. If your Diastolic (lower reading) is above 100 mmHg, elective cases will be cancelled/rescheduled. Blood thinners: These will need to be stopped for procedures. Notify our staff if you are taking any blood thinners. Depending on which one you take, there will be specific instructions on how and when to stop it. Diabetics on insulin: Notify the staff so that you can be scheduled 1st case in the morning. If your diabetes requires high dose insulin, take only  of your normal insulin dose the morning of the procedure and notify the staff that you have done so. Preventing infections: Shower with an antibacterial soap the morning of your procedure. Build-up your immune system: Take 1000 mg of Vitamin C with every meal (3 times a day) the day prior to your procedure. Antibiotics: Inform the staff if you have a condition or reason that requires you to take antibiotics before dental procedures. Pregnancy: If you are pregnant, call and cancel the procedure. Sickness: If  you have a cold, fever, or any active infections, call and cancel the procedure. Arrival: You must be in the facility at least 30 minutes prior to your scheduled procedure. Children: Do not bring children with you. Dress appropriately: Bring dark clothing that you would not mind if they get stained. Valuables: Do not bring any jewelry or valuables.  Reasons to call and reschedule or cancel your procedure: (Following these recommendations will minimize the risk of a serious complication.) Surgeries: Avoid having procedures within 2 weeks of any surgery. (Avoid for 2 weeks before or after any surgery). Flu Shots: Avoid having procedures within 2 weeks of a flu shots. (Avoid for 2 weeks before or after immunizations). Barium: Avoid having a procedure within 7-10 days after having had a radiological study involving the use of radiological contrast. (Myelograms, Barium swallow or enema study). Heart attacks: Avoid any elective procedures or surgeries for the initial 6 months after a "Myocardial Infarction" (Heart Attack). Blood thinners: It is imperative that you stop these medications before procedures. Let us know if you if you take any blood thinner.  Infection: Avoid procedures during or within two weeks of an infection (including chest colds or gastrointestinal problems). Symptoms associated with infections include: Localized redness, fever, chills, night sweats or profuse sweating, burning sensation when voiding, cough, congestion, stuffiness, runny nose, sore throat, diarrhea, nausea, vomiting, cold or Flu symptoms, recent or current infections. It is specially important if the infection is over the area that we intend to treat. Heart and lung problems: Symptoms that may suggest an active cardiopulmonary problem include: cough, chest pain, breathing difficulties or shortness of breath, dizziness, ankle swelling, uncontrolled high or unusually low blood pressure, and/or palpitations. If you are  experiencing any of these symptoms, cancel your procedure and contact your primary care physician for an evaluation.  Remember:  Regular Business hours are:  Monday to Thursday 8:00 AM to 4:00 PM  Provider's Schedule: Milinda Pointer, MD:  Procedure days: Tuesday and Thursday 7:30 AM to 4:00 PM  Gillis Santa, MD:  Procedure days: Monday and Wednesday 7:30 AM to 4:00 PM ______________________________________________________________________  Epidural Steroid Injection An epidural steroid injection is a shot of steroid medicine and numbing medicine that is given into the space between the spinal cord and the bones of the back (epidural space). The shot helps relieve pain caused by an irritated or swollen nerve root. The amount of pain relief you get from the injection depends on what is causing the nerve to be swollen and irritated, and how long your pain lasts. You are more likely to benefit from this injection if your pain is strong and comes on suddenly rather than if you have had long-term (chronic) pain. Tell a health care provider about: Any allergies you have. All medicines you are taking, including vitamins, herbs, eye drops, creams, and over-the-counter medicines. Any problems you or family members have had with anesthetic medicines. Any blood disorders you have. Any surgeries you have had. Any medical conditions you have. Whether you are pregnant or may be pregnant. What are the risks? Generally, this is a safe procedure. However, problems may occur, including: Headache. Bleeding. Infection. Allergic reaction to medicines. Nerve damage. What happens before the procedure? Staying hydrated Follow instructions from your health care provider about hydration, which may include: Up to 2 hours before the procedure - you may continue to drink clear liquids, such as water, clear fruit juice, black coffee, and plain tea. Eating and drinking restrictions Follow instructions from  your health care provider about eating and drinking, which may include: 8 hours before the procedure - stop eating heavy meals or foods, such as meat, fried foods, or fatty foods. 6 hours before the procedure - stop eating light meals or foods, such as toast or cereal. 6 hours before the procedure - stop drinking milk or drinks that contain milk. 2 hours before the procedure - stop drinking clear liquids. Medicines You may be given medicines to lower anxiety. Ask your health care provider about: Changing or stopping your regular medicines. This is especially important if you are taking diabetes medicines or blood thinners. Taking medicines such as aspirin and ibuprofen. These medicines can thin your blood. Do not take these medicines unless your health care provider tells you to take them. Taking over-the-counter medicines, vitamins, herbs, and supplements. General instructions Ask your health care provider what steps will be taken to prevent infection. Plan to have a responsible adult take you home from the hospital or clinic. If you will be going home right after the procedure, plan to have a responsible adult care for you for the time you are told. This is important. What happens during the procedure? An IV will be inserted into one of your veins. You will be given one or more of the following: A medicine to help you relax (sedative). A medicine to numb the area (local anesthetic). You will be asked to lie on your abdomen or sit. The injection site will be cleaned. A needle will be inserted through your skin into the epidural space. This may cause you some discomfort. An X-ray machine will be used to guide the needle as close as possible to the affected nerve. A steroid medicine and a  local anesthetic will be injected into the epidural space. The needle and IV will be removed. A bandage (dressing) will be put over the injection site. The procedure may vary among health care providers and  hospitals. What can I expect after the procedure? Your blood pressure, heart rate, breathing rate, and blood oxygen level will be monitored until you leave the hospital or clinic. Your arm or leg may feel weak or numb for a few hours. The injection site may feel sore. Follow these instructions at home: Injection site care You may remove the bandage (dressing) after 24 hours. Check your injection site every day for signs of infection. Check for: Redness, swelling, or pain. Fluid or blood. Warmth. Pus or a bad smell. Managing pain, stiffness, and swelling For 24 hours after the procedure: Avoid using heat on the injection site. Do not take baths, swim, or use a hot tub until your health care provider approves. Ask your health care provider if you may take showers. You may only be allowed to take sponge baths. If directed, put ice on the injection site. To do this: Put ice in a plastic bag. Place a towel between your skin and the bag. Leave the ice on for 20 minutes, 2-3 times a day.  Activity If you were given a sedative during the procedure, it can affect you for several hours. Do not drive or operate machinery until your health care provider says that it is safe. Return to your normal activities as told by your health care provider. Ask your health care provider what activities are safe for you. General instructions Take over-the-counter and prescription medicines only as told by your health care provider. Drink enough fluid to keep your urine pale yellow. Keep all follow-up visits as told by your health care provider. This is important. Contact a health care provider if: You have any of these signs of infection: Redness, swelling, or pain around your injection site. Fluid or blood coming from your injection site. Warmth coming from your injection site. Pus or a bad smell coming from your injection site. A fever. You continue to have pain and soreness around the injection site,  even after taking over-the-counter pain medicine. You have severe, sudden, or lasting nausea or vomiting. Get help right away if: You have severe pain at the injection site that is not relieved by medicines. You develop a severe headache or a stiff neck. You become sensitive to light. You have any new numbness or weakness in your legs or arms. You lose control of your bladder or bowel movements. You have trouble breathing. Summary An epidural steroid injection is a shot of steroid medicine and numbing medicine that is given into the epidural space. The shot helps relieve pain caused by an irritated or swollen nerve root. You are more likely to benefit from this injection if your pain is strong and comes on suddenly rather than if you have had chronic pain. This information is not intended to replace advice given to you by your health care provider. Make sure you discuss any questions you have with your health care provider. Document Revised: 06/06/2019 Document Reviewed: 08/19/2018 [employer] Patient Education  [place].

## 2020-10-26 NOTE — Progress Notes (Signed)
Patient: Shannon Crawford  Service Category: E/M  Provider: Edward Jolly, MD  DOB: 1955/03/29  DOS: 10/26/2020  Referring Provider: Tressie Stalker, MD  MRN: [id number]  Setting: Ambulatory outpatient  PCP: Margaretann Loveless, MD  Type: New Patient  Specialty: Interventional Pain Management    Location: Office  Delivery: Face-to-face     Primary Reason(s) for Visit: Encounter for initial evaluation of one or more chronic problems (new to examiner) potentially causing chronic pain, and posing a threat to normal musculoskeletal function. (Level of risk: High) CC: Back Pain (Right buttock) and Hip Pain (right)  HPI  Shannon Crawford is a 65 y.o. year old, female patient, who comes for the first time to our practice referred by Tressie Stalker, MD for our initial evaluation of her chronic pain. She has Lumbar degenerative disc disease; Lumbar spondylosis; Carpal tunnel syndrome; Chronic right SI joint pain; Bilateral occipital neuralgia; Ankle fracture, right; Fracture of clavicle; Lumbar radiculopathy; and Piriformis syndrome of right side on their problem list. Today she comes in for evaluation of her Back Pain (Right buttock) and Hip Pain (right)  Pain Assessment: Location: Lower Back Radiating: radiates into right buttock and hip Onset: More than a month ago Duration: Chronic pain Quality: Constant, Dull, Aching Severity: 1 /10 (subjective, self-reported pain score)  Effect on ADL: limits activities Timing: Constant Modifying factors: standing, taking pressure off, lying down BP: (!) 154/90  HR: 76  Onset and Duration: Gradual Cause of pain: Unknown Severity: Getting worse, NAS-11 at its worse: 9/10, NAS-11 at its best: 1/10, NAS-11 now: 1/10, and NAS-11 on the average: 3/10 Timing: Afternoon, Night, and After activity or exercise Aggravating Factors: Climbing, Prolonged sitting, and Walking uphill Alleviating Factors: Cold packs, Lying down, Medications, Resting, Sleeping, and Standing Associated  Problems: Depression, Inability to concentrate, Numbness, Personality changes, and Sadness Quality of Pain: Aching, Annoying, Constant, Deep, Distressing, Dull, Nagging, Tender, and Uncomfortable Previous Examinations or Tests: MRI scan Previous Treatments: Epidural steroid injections  Shannon Crawford is a pleasant 65 year old female who presents with a chief complaint of low back pain that radiates into her right buttock and right hip region.  Of note she has been seen by a pain clinic in the past and was a previous patient of Dr. Metta Clines.  She states that she has been dealing with this problem for many years.  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  She has seen Dr. Lovell Sheehan with neurosurgery after her most recent lumbar MRI which is below.  He recommends that she consider injection therapy at this time.  She does have a dermatomal component to her pain.  She also endorses pain deep in her right buttock overlying her SI joint and piriformis.  She does stretching exercises every day and is also getting aerobic exercise program.  She states that she has tried many medications in the past including NSAIDs, neuropathic's, opioids medications and she is not interested in retrying these as they were not effective and only made her confused.  She does have a history of depression.  She is recently started Cymbalta that she found in her house at 60 mg and states that this is helpful.  She is requesting a referral to psychiatry for depression management.   Meds   Current Outpatient Medications:    Cholecalciferol (VITAMIN D PO), Take by mouth., Disp: , Rfl:    DULoxetine (CYMBALTA) 60 MG capsule, Take 60 mg by mouth daily., Disp: ,  Rfl:    levothyroxine (SYNTHROID) 88 MCG tablet, Take 88 mcg by mouth daily before breakfast., Disp: , Rfl:    losartan (COZAAR) 50 MG tablet, Take 50 mg by mouth daily., Disp: , Rfl:    meloxicam (MOBIC) 15 MG tablet, Take 15 mg  by mouth daily., Disp: , Rfl:    Multiple Vitamin (MULTIVITAMIN) capsule, Take 1 capsule by mouth daily., Disp: , Rfl:    rosuvastatin (CRESTOR) 20 MG tablet, 20 mg daily., Disp: , Rfl:    zolpidem (AMBIEN) 5 MG tablet, Take 1 tablet (5 mg total) by mouth at bedtime as needed for sleep., Disp: 30 tablet, Rfl: 0   zolpidem (AMBIEN) 5 MG tablet, Take 5 mg by mouth at bedtime as needed for sleep., Disp: , Rfl:   Imaging Review   Lumbosacral Imaging: Lumbar MR wo contrast: Results for orders placed during the hospital encounter of 08/17/20  MR LUMBAR SPINE WO CONTRAST  Narrative CLINICAL DATA:  Chronic low back pain.  Right leg pain.  EXAM: MRI LUMBAR SPINE WITHOUT CONTRAST  TECHNIQUE: Multiplanar, multisequence MR imaging of the lumbar spine was performed. No intravenous contrast was administered.  COMPARISON:  Lumbar spine radiograph 08/03/2020. Lumbar MRI 12/15/2011  FINDINGS: Segmentation:  Standard  Alignment: 3 mm retrolisthesis L1-2 and L2-3. 6 mm anterolisthesis L3-4 with progression from the prior MRI.  Vertebrae:  Normal bone marrow.  Negative for fracture or mass.  Conus medullaris and cauda equina: Conus extends to the L1 level. Conus and cauda equina appear normal.  Paraspinal and other soft tissues: Negative for paraspinous mass or adenopathy.  Disc levels:  T12-L1: Negative  L1-2: Small central and left-sided disc protrusion. Mild facet degeneration. Mild subarticular stenosis bilaterally  L2-3: Mild disc bulging and mild facet degeneration. No significant stenosis  L3-4: 6 mm anterolisthesis with moderate to advanced facet degeneration. Mild spinal stenosis. Mild left subarticular stenosis  L4-5: New left-sided disc protrusion with probable extruded disc fragment extending caudally. This is causing left L5 nerve root impingement. Smaller and slightly progressive right-sided disc protrusion and right L5 nerve root impingement. Bilateral  facet degeneration. Mild spinal stenosis.  L5-S1: Disc degeneration with disc bulging and diffuse endplate spurring. Bilateral mild facet degeneration. Mild subarticular and foraminal stenosis bilaterally.  IMPRESSION: Multilevel degenerative change in lumbar spine  Bilateral L5 nerve root impingement at L4-5 due to bilateral disc protrusions, left more severe than the right.   Electronically Signed By: Marlan Palau M.D. On: 08/18/2020 08:22  DG Ankle Complete Left  Narrative *RADIOLOGY REPORT*  Clinical Data: 66 year old female with left ankle pain following injury.  LEFT ANKLE COMPLETE - 3+ VIEW  Comparison: 11/23/2009  Findings: There is no evidence of acute fracture, subluxation or dislocation. The ankle mortise is intact. No focal bony lesions are identified. There is no evidence of soft tissue swelling.  IMPRESSION: No evidence of acute bony abnormality.   Original Report Authenticated By: Rosendo Gros, M.D.  Complexity Note: Imaging results reviewed. Results shared with Shannon Crawford, using Layman's terms.                         ROS  Cardiovascular: High blood pressure and Heart murmur Pulmonary or Respiratory: Temporary stoppage of breathing during sleep Neurological: No reported neurological signs or symptoms such as seizures, abnormal skin sensations, urinary and/or fecal incontinence, being born with an abnormal open spine and/or a tethered spinal cord Psychological-Psychiatric: Depressed Gastrointestinal: Inflamed liver (Hepatitis) Genitourinary: No reported renal or  genitourinary signs or symptoms such as difficulty voiding or producing urine, peeing blood, non-functioning kidney, kidney stones, difficulty emptying the bladder, difficulty controlling the flow of urine, or chronic kidney disease Hematological: No reported hematological signs or symptoms such as prolonged bleeding, low or poor functioning platelets, bruising or bleeding easily, hereditary  bleeding problems, low energy levels due to low hemoglobin or being anemic Endocrine: Slow thyroid Rheumatologic: No reported rheumatological signs and symptoms such as fatigue, joint pain, tenderness, swelling, redness, heat, stiffness, decreased range of motion, with or without associated rash Musculoskeletal: Negative for myasthenia gravis, muscular dystrophy, multiple sclerosis or malignant hyperthermia Work History: Retired  Allergies  Shannon Crawford is allergic to catfish [fish allergy] and no known allergies.  Laboratory Chemistry Profile   Renal No results found for: BUN, CREATININE, LABCREA, BCR, GFR, GFRAA, GFRNONAA, SPECGRAV, PHUR, PROTEINUR   Electrolytes No results found for: NA, K, CL, CALCIUM, MG, PHOS   Hepatic No results found for: AST, ALT, ALBUMIN, ALKPHOS, AMYLASE, LIPASE, AMMONIA   ID No results found for: LYMEIGGIGMAB, HIV, SARSCOV2NAA, STAPHAUREUS, MRSAPCR, HCVAB, PREGTESTUR, RMSFIGG, QFVRPH1IGG, QFVRPH2IGG, LYMEIGGIGMAB   Bone No results found for: VD25OH, AO130QM5HQI, ON6295MW4, XL2440NU2, 25OHVITD1, 25OHVITD2, 25OHVITD3, TESTOFREE, TESTOSTERONE   Endocrine No results found for: GLUCOSE, GLUCOSEU, HGBA1C, TSH, FREET4, TESTOFREE, TESTOSTERONE, SHBG, ESTRADIOL, ESTRADIOLPCT, ESTRADIOLFRE, LABPREG, ACTH, CRTSLPL, UCORFRPERLTR, UCORFRPERDAY, CORTISOLBASE, LABPREG   Neuropathy No results found for: VITAMINB12, FOLATE, HGBA1C, HIV   CNS No results found for: COLORCSF, APPEARCSF, RBCCOUNTCSF, WBCCSF, POLYSCSF, LYMPHSCSF, EOSCSF, PROTEINCSF, GLUCCSF, JCVIRUS, CSFOLI, IGGCSF, LABACHR, ACETBL, LABACHR, ACETBL   Inflammation (CRP: Acute  ESR: Chronic) Lab Results  Component Value Date   ESRSEDRATE 6 07/25/2012     Rheumatology No results found for: RF, ANA, LABURIC, URICUR, LYMEIGGIGMAB, LYMEABIGMQN, HLAB27   Coagulation No results found for: INR, LABPROT, APTT, PLT, DDIMER, LABHEMA, VITAMINK1, AT3   Cardiovascular No results found for: BNP, CKTOTAL, CKMB,  TROPONINI, HGB, HCT, LABVMA, EPIRU, EPINEPH24HUR, NOREPRU, NOREPI24HUR, DOPARU, DOPAM24HRUR   Screening No results found for: SARSCOV2NAA, COVIDSOURCE, STAPHAUREUS, MRSAPCR, HCVAB, HIV, PREGTESTUR   Cancer No results found for: CEA, CA125, LABCA2   Allergens No results found for: ALMOND, APPLE, ASPARAGUS, AVOCADO, BANANA, BARLEY, BASIL, BAYLEAF, GREENBEAN, LIMABEAN, WHITEBEAN, BEEFIGE, REDBEET, BLUEBERRY, BROCCOLI, CABBAGE, MELON, CARROT, CASEIN, CASHEWNUT, CAULIFLOWER, CELERY     Note: Lab results reviewed.  PFSH  Drug: Shannon Crawford  reports no history of drug use. Alcohol:  reports current alcohol use of about 1.0 standard drink per week. Tobacco:  reports that she has never smoked. She has never used smokeless tobacco. Medical:  has a past medical history of Anxiety, Fracture, Heart murmur, Hypothyroid, and Thyroid disease. Family: family history includes Alcohol abuse in her father; Anxiety disorder in her sister; Breast cancer in her cousin; Breast cancer (age of onset: 73) in her sister; Hypertension in her mother and sister; Prostate cancer in her brother.  Past Surgical History:  Procedure Laterality Date   ABDOMINAL SURGERY     tummy tuck   BREAST BIOPSY Left 10+ years   Negative- core bx   Active Ambulatory Problems    Diagnosis Date Noted   Lumbar degenerative disc disease 07/16/2014   Lumbar spondylosis 07/16/2014   Carpal tunnel syndrome 07/16/2014   Chronic right SI joint pain 07/16/2014   Bilateral occipital neuralgia 07/16/2014   Ankle fracture, right 07/16/2014   Fracture of clavicle 11/29/2017   Lumbar radiculopathy 10/26/2020   Piriformis syndrome of right side 10/26/2020   Resolved Ambulatory Problems    Diagnosis Date  Noted   No Resolved Ambulatory Problems   Past Medical History:  Diagnosis Date   Anxiety    Fracture    Heart murmur    Hypothyroid    Thyroid disease    Constitutional Exam  General appearance: Well nourished, well developed, and  well hydrated. In no apparent acute distress Vitals:   10/26/20 1408  BP: (!) 154/90  Pulse: 76  Resp: 18  Temp: (!) 96.8 F (36 C)  SpO2: 98%  Weight: 187 lb (84.8 kg)  Height: 5\' 2"  (1.575 m)   BMI Assessment: Estimated body mass index is 34.2 kg/m as calculated from the following:   Height as of this encounter: 5\' 2"  (1.575 m).   Weight as of this encounter: 187 lb (84.8 kg).  BMI interpretation table: BMI level Category Range association with higher incidence of chronic pain  <18 kg/m2 Underweight   18.5-24.9 kg/m2 Ideal body weight   25-29.9 kg/m2 Overweight Increased incidence by 20%  30-34.9 kg/m2 Obese (Class I) Increased incidence by 68%  35-39.9 kg/m2 Severe obesity (Class II) Increased incidence by 136%  >40 kg/m2 Extreme obesity (Class III) Increased incidence by 254%   Patient's current BMI Ideal Body weight  Body mass index is 34.2 kg/m. Ideal body weight: 50.1 kg (110 lb 7.2 oz) Adjusted ideal body weight: 64 kg (141 lb 1.1 oz)   BMI Readings from Last 4 Encounters:  10/26/20 34.20 kg/m  10/06/15 35.12 kg/m  09/07/15 32.77 kg/m  08/10/15 32.26 kg/m   Wt Readings from Last 4 Encounters:  10/26/20 187 lb (84.8 kg)  10/06/15 192 lb (87.1 kg)  09/07/15 185 lb (83.9 kg)  08/10/15 185 lb (83.9 kg)    Psych/Mental status: Alert, oriented x 3 (person, place, & time)       Eyes: PERLA Respiratory: No evidence of acute respiratory distress  Lumbar Spine Area Exam  Skin & Axial Inspection: No masses, redness, or swelling Alignment: Symmetrical Functional ROM: Pain restricted ROM       Stability: No instability detected Muscle Tone/Strength: Functionally intact. No obvious neuro-muscular anomalies detected. Sensory (Neurological): Dermatomal pain pattern and MSK  Provocative Tests: Hyperextension/rotation test: deferred today       Lumbar quadrant test (Kemp's test): deferred today       Lateral bending test: (+) due to pain. Patrick's Maneuver: (+)  for right-sided S-I arthralgia             FABER* test: (+) for right-sided S-I arthralgia              Gait & Posture Assessment  Ambulation: Unassisted Gait: Relatively normal for age and body habitus Posture: WNL   Lower Extremity Exam    Side: Right lower extremity  Side: Left lower extremity  Stability: No instability observed          Stability: No instability observed          Skin & Extremity Inspection: Skin color, temperature, and hair growth are WNL. No peripheral edema or cyanosis. No masses, redness, swelling, asymmetry, or associated skin lesions. No contractures.  Skin & Extremity Inspection: Skin color, temperature, and hair growth are WNL. No peripheral edema or cyanosis. No masses, redness, swelling, asymmetry, or associated skin lesions. No contractures.  Functional ROM: Pain restricted ROM                  Functional ROM: Unrestricted ROM  Muscle Tone/Strength: Functionally intact. No obvious neuro-muscular anomalies detected.  Muscle Tone/Strength: Functionally intact. No obvious neuro-muscular anomalies detected.  Sensory (Neurological): Dermatomal pain pattern        Sensory (Neurological): Unimpaired        DTR: Patellar: deferred today Achilles: deferred today Plantar: deferred today  DTR: Patellar: deferred today Achilles: deferred today Plantar: deferred today  Palpation: No palpable anomalies  Palpation: No palpable anomalies    Assessment  Primary Diagnosis & Pertinent Problem List: The primary encounter diagnosis was Chronic radicular lumbar pain (RIGHT>LEFT). Diagnoses of Lumbar radiculopathy, Lumbar spondylosis, Lumbar degenerative disc disease, Chronic right SI joint pain, Piriformis syndrome of right side, Chronic pain syndrome, and Moderate episode of recurrent major depressive disorder (HCC) were also pertinent to this visit.  Visit Diagnosis (New problems to examiner): 1. 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 Lumbar  radiculopathy   3. Lumbar spondylosis   4. Lumbar degenerative disc disease   5. Chronic right SI joint pain   6. Piriformis syndrome of right side   7. Chronic pain syndrome   8. Moderate episode of recurrent major depressive disorder (HCC)    Plan of Care (Initial workup plan)  Note: Shannon Crawford was reminded that as per protocol, today's visit has been an evaluation only. We have not taken over the patient's controlled substance management.  General Recommendations: The pain condition that the patient suffers from is best treated with a multidisciplinary approach that involves an increase in physical activity to prevent de-conditioning and worsening of the pain cycle, as well as psychological counseling (formal and/or informal) to address the co-morbid psychological affects of pain. Treatment will often involve judicious use of pain medications and interventional procedures to decrease the pain, allowing the patient to participate in the physical activity that will ultimately produce long-lasting pain reductions. The goal of the multidisciplinary approach is to return the patient to a higher level of overall function and to restore their ability to perform activities of daily living.   1.  Recommend right L4-L5 lumbar epidural steroid injection given disc herniation at L4-L5 resulting in right L4 radiculopathy. 2.  Referral to psychiatry for depression management.  Continue Cymbalta 60 mg as prescribed. 3.  Future considerations include diagnostic right sacroiliac joint injection and diagnostic right piriformis injection. 4.  Defer medication management to PCP.  Referral Orders         Ambulatory referral to Psychiatry     Procedure Orders         Lumbar Epidural Injection       Provider-requested follow-up: Return in about 2 weeks (around 11/09/2020) for Right L4/5 ESI, minimal sedation (PO Valium). I spent a total of 60 minutes reviewing chart data, face-to-face evaluation with the  patient, counseling and coordination of care as detailed above.  No future appointments.  Note by: Edward Jolly, MD Date: 10/26/2020; Time: 3:10 PM

## 2020-10-26 NOTE — Progress Notes (Signed)
Safety precautions to be maintained throughout the outpatient stay will include: orient to surroundings, keep bed in low position, maintain call bell within reach at all times, provide assistance with transfer out of bed and ambulation.  

## 2020-11-03 ENCOUNTER — Ambulatory Visit (HOSPITAL_BASED_OUTPATIENT_CLINIC_OR_DEPARTMENT_OTHER): Payer: Medicare HMO | Admitting: Student in an Organized Health Care Education/Training Program

## 2020-11-03 ENCOUNTER — Other Ambulatory Visit: Payer: Self-pay

## 2020-11-03 ENCOUNTER — Encounter: Payer: Self-pay | Admitting: Student in an Organized Health Care Education/Training Program

## 2020-11-03 ENCOUNTER — Ambulatory Visit
Admission: RE | Admit: 2020-11-03 | Discharge: 2020-11-03 | Disposition: A | Discharge status: Home / Self Care | Payer: Medicare HMO | Source: Ambulatory Visit | Attending: Student in an Organized Health Care Education/Training Program | Admitting: Student in an Organized Health Care Education/Training Program

## 2020-11-03 DIAGNOSIS — G8929 Other chronic pain: Secondary | ICD-10-CM | POA: Diagnosis not present

## 2020-11-03 DIAGNOSIS — G894 Chronic pain syndrome: Secondary | ICD-10-CM

## 2020-11-03 DIAGNOSIS — M5416 Radiculopathy, lumbar region: Secondary | ICD-10-CM | POA: Insufficient documentation

## 2020-11-03 MED ORDER — IOHEXOL 180 MG/ML  SOLN
10.0000 mL | Freq: Once | INTRAMUSCULAR | Status: AC
  Administered 2020-11-03: 10 mL via EPIDURAL

## 2020-11-03 MED ORDER — ROPIVACAINE HCL 2 MG/ML IJ SOLN
2.0000 mL | Freq: Once | INTRAMUSCULAR | Status: AC
  Administered 2020-11-03: 2 mL via EPIDURAL

## 2020-11-03 MED ORDER — LIDOCAINE HCL 2 % IJ SOLN
20.0000 mL | Freq: Once | INTRAMUSCULAR | Status: AC
  Administered 2020-11-03: 200 mg

## 2020-11-03 MED ORDER — LIDOCAINE HCL (PF) 2 % IJ SOLN
INTRAMUSCULAR | Status: AC
  Filled 2020-11-03: qty 10

## 2020-11-03 MED ORDER — DEXAMETHASONE SODIUM PHOSPHATE 10 MG/ML IJ SOLN
10.0000 mg | Freq: Once | INTRAMUSCULAR | Status: AC
  Administered 2020-11-03: 10 mg
  Filled 2020-11-03: qty 1

## 2020-11-03 MED ORDER — SODIUM CHLORIDE 0.9% FLUSH
2.0000 mL | Freq: Once | INTRAVENOUS | Status: AC
  Administered 2020-11-03: 2 mL

## 2020-11-03 MED ORDER — DIAZEPAM 5 MG PO TABS
ORAL_TABLET | ORAL | Status: AC
  Filled 2020-11-03: qty 1

## 2020-11-03 MED ORDER — SODIUM CHLORIDE (PF) 0.9 % IJ SOLN
INTRAMUSCULAR | Status: AC
  Filled 2020-11-03: qty 10

## 2020-11-03 NOTE — Progress Notes (Signed)
Safety precautions to be maintained throughout the outpatient stay will include: orient to surroundings, keep bed in low position, maintain call bell within reach at all times, provide assistance with transfer out of bed and ambulation.   Pt was given valium '5mg'$  po per order.

## 2020-11-03 NOTE — Progress Notes (Signed)
PROVIDER NOTE: Information contained herein reflects review and annotations entered in association with encounter. Interpretation of such information and data should be left to medically-trained personnel. Information provided to patient can be located elsewhere in the medical record under "Patient Instructions". Document created using STT-dictation technology, any transcriptional errors that may result from process are unintentional.    Patient: Shannon Crawford  Service Category: Procedure  Provider: Edward Jolly, MD  DOB: [birth year]  DOS: 11/03/2020  Location: [facility]  MRN: [id number]  Setting: Ambulatory - outpatient  Referring Provider: Edward Jolly, MD  Type: Established Patient  Specialty: Interventional Pain Management  PCP: Margaretann Loveless, MD   Primary Reason for Visit: Interventional Pain Management Treatment. CC: Other   Procedure:          Anesthesia, Analgesia, Anxiolysis:  Type: Diagnostic Inter-Laminar Epidural Steroid Injection  #1  Region: Lumbar Level: L4-5 Level. Laterality: Right-Sided         Type: Local Anesthesia + 5 mg PO Valium Local Anesthetic: Lidocaine 1-2% Sedation: Minimal Anxiolysis  Indication(s): Anxiety & Analgesia Route: Oral (PO)    Position: Prone with head of the table was raised to facilitate breathing.   Indications: 1. 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 Lumbar radiculopathy   3. Chronic pain syndrome    Pain Score: Pre-procedure: 1 /10 Post-procedure: 0-No pain/10    Pre-op H&P Assessment:  Shannon Crawford is a 65 y.o. (year old), female patient, seen today for interventional treatment. She  has a past surgical history that includes Abdominal surgery and Breast biopsy (Left, 10+ years). Shannon Crawford has a current medication list which includes the following prescription(s): vitamin d, duloxetine, levothyroxine, losartan, meloxicam, multivitamin, rosuvastatin, zolpidem, and zolpidem. Her primarily concern today is the  Other  Initial Vital Signs:  Pulse/HCG Rate: 73  Temp:  (!) 97 F (36.1 C) Resp: 18 BP:  (!) 155/92 SpO2: 100 %  BMI: Estimated body mass index is 34.2 kg/m as calculated from the following:   Height as of this encounter: 5\' 2"  (1.575 m).   Weight as of this encounter: 187 lb (84.8 kg).  Risk Assessment: Allergies: Reviewed. She is allergic to catfish [fish allergy] and no known allergies.  Allergy Precautions: None required Coagulopathies: Reviewed. None identified.  Blood-thinner therapy: None at this time Active Infection(s): Reviewed. None identified. Shannon Crawford is afebrile  Site Confirmation: Shannon Crawford was asked to confirm the procedure and laterality before marking the site Procedure checklist: Completed Consent: Before the procedure and under the influence of no sedative(s), amnesic(s), or anxiolytics, the patient was informed of the treatment options, risks and possible complications. To fulfill our ethical and legal obligations, as recommended by the American Medical Association's Code of Ethics, I have informed the patient of my clinical impression; the nature and purpose of the treatment or procedure; the risks, benefits, and possible complications of the intervention; the alternatives, including doing nothing; the risk(s) and benefit(s) of the alternative treatment(s) or procedure(s); and the risk(s) and benefit(s) of doing nothing. The patient was provided information about the general risks and possible complications associated with the procedure. These may include, but are not limited to: failure to achieve desired goals, infection, bleeding, organ or nerve damage, allergic reactions, paralysis, and death. In addition, the patient was informed of those risks and complications associated to Spine-related procedures, such as failure to decrease pain; infection (i.e.: Meningitis, epidural or intraspinal abscess); bleeding (i.e.: epidural hematoma, subarachnoid hemorrhage, or any  other type of intraspinal  or peri-dural bleeding); organ or nerve damage (i.e.: Any type of peripheral nerve, nerve root, or spinal cord injury) with subsequent damage to sensory, motor, and/or autonomic systems, resulting in permanent pain, numbness, and/or weakness of one or several areas of the body; allergic reactions; (i.e.: anaphylactic reaction); and/or death. Furthermore, the patient was informed of those risks and complications associated with the medications. These include, but are not limited to: allergic reactions (i.e.: anaphylactic or anaphylactoid reaction(s)); adrenal axis suppression; blood sugar elevation that in diabetics may result in ketoacidosis or comma; water retention that in patients with history of congestive heart failure may result in shortness of breath, pulmonary edema, and decompensation with resultant heart failure; weight gain; swelling or edema; medication-induced neural toxicity; particulate matter embolism and blood vessel occlusion with resultant organ, and/or nervous system infarction; and/or aseptic necrosis of one or more joints. Finally, the patient was informed that Medicine is not an exact science; therefore, there is also the possibility of unforeseen or unpredictable risks and/or possible complications that may result in a catastrophic outcome. The patient indicated having understood very clearly. We have given the patient no guarantees and we have made no promises. Enough time was given to the patient to ask questions, all of which were answered to the patient's satisfaction. Shannon Crawford has indicated that she wanted to continue with the procedure. Attestation: I, the ordering provider, attest that I have discussed with the patient the benefits, risks, side-effects, alternatives, likelihood of achieving goals, and potential problems during recovery for the procedure that I have provided informed consent. Date  Time: 11/03/2020 11:01 AM  Pre-Procedure Preparation:   Monitoring: As per clinic protocol. Respiration, ETCO2, SpO2, BP, heart rate and rhythm monitor placed and checked for adequate function Safety Precautions: Patient was assessed for positional comfort and pressure points before starting the procedure. Time-out: I initiated and conducted the "Time-out" before starting the procedure, as per protocol. The patient was asked to participate by confirming the accuracy of the "Time Out" information. Verification of the correct person, site, and procedure were performed and confirmed by me, the nursing staff, and the patient. "Time-out" conducted as per Joint Commission's Universal Protocol (UP.01.01.01). Time: 1206  Description of Procedure:          Target Area: The interlaminar space, initially targeting the lower laminar border of the superior vertebral body. Approach: Paramedial approach. Area Prepped: Entire Posterior Lumbar Region DuraPrep (Iodine Povacrylex [0.7% available iodine] and Isopropyl Alcohol, 74% w/w) Safety Precautions: Aspiration looking for blood return was conducted prior to all injections. At no point did we inject any substances, as a needle was being advanced. No attempts were made at seeking any paresthesias. Safe injection practices and needle disposal techniques used. Medications properly checked for expiration dates. SDV (single dose vial) medications used. Description of the Procedure: Protocol guidelines were followed. The procedure needle was introduced through the skin, ipsilateral to the reported pain, and advanced to the target area. Bone was contacted and the needle walked caudad, until the lamina was cleared. The epidural space was identified using "loss-of-resistance technique" with 2-3 ml of PF-NaCl (0.9% NSS), in a 5cc LOR glass syringe.  Vitals:   11/03/20 1118 11/03/20 1205 11/03/20 1215  BP: (!) 155/92 (!) 156/98 (!) 155/92  Pulse: 73 67 65  Resp: 18 16 16   Temp: (!) 97 F (36.1 C)    SpO2: 100% 100% 100%   Weight: 187 lb (84.8 kg)    Height: 5\' 2"  (1.575 m)  Start Time: 1206 hrs. End Time: 1213 hrs.  Materials:  Needle(s) Type: Epidural needle Gauge: 22G Length: 3.5-in Medication(s): Please see orders for medications and dosing details. 6 cc solution made of 3 cc of preservative-free saline, 2 cc of 0.2% ropivacaine, 1 cc of Decadron 10 mg/cc.  Imaging Guidance (Spinal):          Type of Imaging Technique: Fluoroscopy Guidance (Spinal) Indication(s): Assistance in needle guidance and placement for procedures requiring needle placement in or near specific anatomical locations not easily accessible without such assistance. Exposure Time: Please see nurses notes. Contrast: Before injecting any contrast, we confirmed that the patient did not have an allergy to iodine, shellfish, or radiological contrast. Once satisfactory needle placement was completed at the desired level, radiological contrast was injected. Contrast injected under live fluoroscopy. No contrast complications. See chart for type and volume of contrast used. Fluoroscopic Guidance: I was personally present during the use of fluoroscopy. "Tunnel Vision Technique" used to obtain the best possible view of the target area. Parallax error corrected before commencing the procedure. "Direction-depth-direction" technique used to introduce the needle under continuous pulsed fluoroscopy. Once target was reached, antero-posterior, oblique, and lateral fluoroscopic projection used confirm needle placement in all planes. Images permanently stored in EMR. Interpretation: I personally interpreted the imaging intraoperatively. Adequate needle placement confirmed in multiple planes. Appropriate spread of contrast into desired area was observed. No evidence of afferent or efferent intravascular uptake. No intrathecal or subarachnoid spread observed. Permanent images saved into the patient's record.  Post-operative Assessment:  Post-procedure  Vital Signs:  Pulse/HCG Rate: 65 (nsr)  Temp:  (!) 97 F (36.1 C) Resp: 16 BP:  (!) 155/92 SpO2: 100 %  EBL: None  Complications: No immediate post-treatment complications observed by team, or reported by patient.  Note: The patient tolerated the entire procedure well. A repeat set of vitals were taken after the procedure and the patient was kept under observation following institutional policy, for this type of procedure. Post-procedural neurological assessment was performed, showing return to baseline, prior to discharge. The patient was provided with post-procedure discharge instructions, including a section on how to identify potential problems. Should any problems arise concerning this procedure, the patient was given instructions to immediately contact us, at any time, without hesitation. In any case, we plan to contact the patient by telephone for a follow-up status report regarding this interventional procedure.  Comments:  No additional relevant information.  5 out of 5 strength bilateral lower extremity: Plantar flexion, dorsiflexion, knee flexion, knee extension.   Plan of Care  Orders:  Orders Placed This Encounter  Procedures   DG PAIN CLINIC C-ARM 1-60 MIN NO REPORT    Intraoperative interpretation by procedural physician at [facility].    Standing Status:   Standing    Number of Occurrences:   1    Order Specific Question:   Reason for exam:    Answer:   Assistance in needle guidance and placement for procedures requiring needle placement in or near specific anatomical locations not easily accessible without such assistance.     Medications ordered for procedure: Meds ordered this encounter  Medications   iohexol (OMNIPAQUE) 180 MG/ML injection 10 mL    Must be Myelogram-compatible. If not available, you may substitute with a water-soluble, non-ionic, hypoallergenic, myelogram-compatible radiological contrast medium.   lidocaine (XYLOCAINE) 2 % (with  pres) injection 400 mg   dexamethasone (DECADRON) injection 10 mg   ropivacaine (PF) 2 mg/mL (0.2%) (NAROPIN) injection 2 mL  sodium chloride flush (NS) 0.9 % injection 2 mL   Medications administered: We administered iohexol, lidocaine, dexamethasone, ropivacaine (PF) 2 mg/mL (0.2%), and sodium chloride flush.  See the medical record for exact dosing, route, and time of administration.  Follow-up plan:   Return in about 4 weeks (around 12/01/2020) for Post Procedure Evaluation, virtual.     Right L4-L5 ESI #1 11/03/2020 Recent Visits Date Type Provider Dept  10/26/20 Office Visit Edward Jolly, MD [facility]  Showing recent visits within past 90 days and meeting all other requirements Today's Visits Date Type Provider Dept  11/03/20 Procedure visit Edward Jolly, MD [facility]  Showing today's visits and meeting all other requirements Future Appointments Date Type Provider Dept  12/01/20 Appointment Edward Jolly, MD [facility]  Showing future appointments within next 90 days and meeting all other requirements Disposition: Discharge home  Discharge (Date  Time): 11/03/2020; 1217 hrs.   Primary Care Physician: Margaretann Loveless, MD Location: [facility] Note by: Edward Jolly, MD Date: 11/03/2020; Time: 2:08 PM  Disclaimer:  Medicine is not an exact science. The only guarantee in medicine is that nothing is guaranteed. It is important to note that the decision to proceed with this intervention was based on the information collected from the patient. The Data and conclusions were drawn from the patient's questionnaire, the interview, and the physical examination. Because the information was provided in large part by the patient, it cannot be guaranteed that it has not been purposely or unconsciously manipulated. Every effort has been made to obtain as much relevant data as possible for this evaluation. It is important to note  that the conclusions that lead to this procedure are derived in large part from the available data. Always take into account that the treatment will also be dependent on availability of resources and existing treatment guidelines, considered by other Pain Management Practitioners as being common knowledge and practice, at the time of the intervention. For Medico-Legal purposes, it is also important to point out that variation in procedural techniques and pharmacological choices are the acceptable norm. The indications, contraindications, technique, and results of the above procedure should only be interpreted and judged by a Board-Certified Interventional Pain Specialist with extensive familiarity and expertise in the same exact procedure and technique.

## 2020-11-03 NOTE — Patient Instructions (Signed)

## 2020-11-04 ENCOUNTER — Telehealth: Payer: Self-pay | Admitting: *Deleted

## 2020-11-04 NOTE — Telephone Encounter (Signed)
Attempted to call for post procedure follow-up. Message left. 

## 2020-11-11 DIAGNOSIS — G4733 Obstructive sleep apnea (adult) (pediatric): Secondary | ICD-10-CM | POA: Diagnosis not present

## 2020-11-30 ENCOUNTER — Telehealth: Payer: Self-pay | Admitting: *Deleted

## 2020-11-30 NOTE — Telephone Encounter (Signed)

## 2020-12-01 ENCOUNTER — Other Ambulatory Visit: Payer: Self-pay

## 2020-12-01 ENCOUNTER — Ambulatory Visit
Payer: Medicare HMO | Attending: Student in an Organized Health Care Education/Training Program | Admitting: Student in an Organized Health Care Education/Training Program

## 2020-12-01 DIAGNOSIS — G894 Chronic pain syndrome: Secondary | ICD-10-CM

## 2020-12-01 NOTE — Progress Notes (Signed)
I attempted to call the patient however no response. Voicemail left instructing patient to call front desk office at [phone] to reschedule appointment. -Dr Kiely Cousar  

## 2020-12-07 ENCOUNTER — Encounter: Payer: Self-pay | Admitting: Student in an Organized Health Care Education/Training Program

## 2020-12-07 ENCOUNTER — Ambulatory Visit
Payer: Medicare HMO | Attending: Student in an Organized Health Care Education/Training Program | Admitting: Student in an Organized Health Care Education/Training Program

## 2020-12-07 ENCOUNTER — Other Ambulatory Visit: Payer: Self-pay

## 2020-12-07 DIAGNOSIS — G894 Chronic pain syndrome: Secondary | ICD-10-CM | POA: Diagnosis not present

## 2020-12-07 DIAGNOSIS — M5416 Radiculopathy, lumbar region: Secondary | ICD-10-CM

## 2020-12-07 DIAGNOSIS — G8929 Other chronic pain: Secondary | ICD-10-CM

## 2020-12-07 NOTE — Progress Notes (Signed)
Patient: Shannon Crawford  Service Category: E/M  Provider: Edward Jolly, MD  DOB: 12-17-1955  DOS: 12/07/2020  Location: Office  MRN: [id number]  Setting: Ambulatory outpatient  Referring Provider: Margaretann Loveless, MD  Type: Established Patient  Specialty: Interventional Pain Management  PCP: Margaretann Loveless, MD  Location: Remote location  Delivery: TeleHealth     Virtual Encounter - Pain Management PROVIDER NOTE: Information contained herein reflects review and annotations entered in association with encounter. Interpretation of such information and data should be left to medically-trained personnel. Information provided to patient can be located elsewhere in the medical record under "Patient Instructions". Document created using STT-dictation technology, any transcriptional errors that may result from process are unintentional.    Contact & Pharmacy Preferred: [phone] Home: There is no home phone number on file. Mobile: [phone] (mobile) E-mail: [email or web address] [id number] [address], [place] - [address] [place] [address] Phone: [phone] Fax: [phone]   Pre-screening  Shannon Crawford offered "in-person" vs "virtual" encounter. She indicated preferring virtual for this encounter.   Reason COVID-19*  Social distancing based on CDC and AMA recommendations.   I contacted Shannon Crawford on 12/07/2020 via telephone.      I clearly identified myself as Edward Jolly, MD. I verified that I was speaking with the correct person using two identifiers (Name: DEANI MELBOURNE, and date of birth: 07-Jun-1955).  Consent I sought verbal advanced consent from Shannon Crawford for virtual visit interactions. I informed Shannon Crawford of possible security and privacy concerns, risks, and limitations associated with providing "not-in-person" medical evaluation and management services. I also informed Shannon Crawford of the availability of "in-person" appointments. Finally,  I informed her that there would be a charge for the virtual visit and that she could be  personally, fully or partially, financially responsible for it. Ms. Cerullo expressed understanding and agreed to proceed.   Historic Elements   Shannon Crawford is a 65 y.o. year old, female patient evaluated today after our last contact on 12/01/2020. Shannon Crawford  has a past medical history of Anxiety, Fracture, Heart murmur, Hypothyroid, and Thyroid disease. She also  has a past surgical history that includes Abdominal surgery and Breast biopsy (Left, 10+ years). Shannon Crawford has a current medication list which includes the following prescription(s): vitamin d, duloxetine, levothyroxine, losartan, meloxicam, multivitamin, rosuvastatin, zolpidem, and zolpidem. She  reports that she has never smoked. She has never used smokeless tobacco. She reports current alcohol use of about 1.0 standard drink per week. She reports that she does not use drugs. Shannon Crawford is allergic to catfish [fish allergy] and no known allergies.   HPI  Today, she is being contacted for a post-procedure assessment.   Post-Procedure Evaluation  Procedure (11/03/2020):   Type: Diagnostic Inter-Laminar Epidural Steroid Injection  #1  Region: Lumbar Level: L4-5 Level. Laterality: Right-Sided         Anxiolysis: Please see nurses note.  Effectiveness during initial hour after procedure (Ultra-Short Term Relief): 80 %   Local anesthetic used: Long-acting (4-6 hours) Effectiveness: Defined as any analgesic benefit obtained secondary to the administration of local anesthetics. This carries significant diagnostic value as to the etiological location, or anatomical origin, of the pain. Duration of benefit is expected to coincide with the duration of the local anesthetic used.  Effectiveness during initial 4-6 hours after procedure (Short-Term Relief): 80 %   Long-term benefit: Defined as any relief past the pharmacologic  duration of the local  anesthetics.  Effectiveness past the initial 6 hours after procedure (Long-Term Relief): 80 %   Benefits, current: Defined as benefit present at the time of this evaluation.   Analgesia:  75%-80% Function: Shannon Crawford reports improvement in function ROM: Shannon Crawford reports improvement in ROM   Laboratory Chemistry Profile   Renal No results found for: BUN, CREATININE, LABCREA, BCR, GFR, GFRAA, GFRNONAA, LABVMA, EPIRU, [id number], NOREPRU, NOREPI24HUR, DOPARU, [id number]  Hepatic No results found for: AST, ALT, ALBUMIN, ALKPHOS, HCVAB, AMYLASE, LIPASE, AMMONIA  Electrolytes No results found for: NA, K, CL, CALCIUM, MG, PHOS  Bone No results found for: VD25OH, [id number], [id number], [id number], 25OHVITD1, 25OHVITD2, 25OHVITD3, TESTOFREE, TESTOSTERONE  Inflammation (CRP: Acute Phase) (ESR: Chronic Phase) Lab Results  Component Value Date   ESRSEDRATE 6 07/25/2012         Note: Above Lab results reviewed.    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 Diagnoses of Chronic pain syndrome and Lumbar radiculopathy were also pertinent to this visit.  Plan of Care   Shannon Crawford has a current medication list which includes the following long-term medication(s): duloxetine, levothyroxine, losartan, rosuvastatin, zolpidem, and zolpidem.  Significant analgesic response and functional improvement after previous lumbar epidural steroid injection.  Patient is pleased with the results.  Repeat as needed.  As needed order placed as below.   Orders:  Orders Placed This Encounter  Procedures   Lumbar Epidural Injection    Standing Status:   Standing    Number of Occurrences:   3    Standing Expiration Date:   06/07/2021    Scheduling Instructions:     Procedure: Interlaminar Lumbar Epidural Steroid injection (LESI)            Laterality: Midline     RIGHT L4/5    Order Specific Question:   Where will this procedure be performed?     Answer:   [facility]    Follow-up plan:   Return for PRN L-ESI.    Recent Visits Date Type Provider Dept  12/01/20 Office Visit Edward Jolly, MD [facility]  11/03/20 Procedure visit Edward Jolly, MD [facility]  10/26/20 Office Visit Edward Jolly, MD [facility]  Showing recent visits within past 90 days and meeting all other requirements Today's Visits Date Type Provider Dept  12/07/20 Office Visit Edward Jolly, MD [facility]  Showing today's visits and meeting all other requirements Future Appointments No visits were found meeting these conditions. Showing future appointments within next 90 days and meeting all other requirements I discussed the assessment and treatment plan with the patient. The patient was provided an opportunity to ask questions and all were answered. The patient agreed with the plan and demonstrated an understanding of the instructions.  Patient advised to call back or seek an in-person evaluation if the symptoms or condition worsens.  Duration of encounter: .  Note by: Edward Jolly, MD Date: 12/07/2020; Time: 3:35 PM

## 2020-12-11 DIAGNOSIS — G4733 Obstructive sleep apnea (adult) (pediatric): Secondary | ICD-10-CM | POA: Diagnosis not present

## 2020-12-16 ENCOUNTER — Other Ambulatory Visit: Payer: Self-pay | Admitting: Physician Assistant

## 2020-12-16 ENCOUNTER — Other Ambulatory Visit: Payer: Self-pay | Admitting: Internal Medicine

## 2020-12-16 DIAGNOSIS — E039 Hypothyroidism, unspecified: Secondary | ICD-10-CM | POA: Diagnosis not present

## 2020-12-16 DIAGNOSIS — R69 Illness, unspecified: Secondary | ICD-10-CM | POA: Diagnosis not present

## 2020-12-16 DIAGNOSIS — N6489 Other specified disorders of breast: Secondary | ICD-10-CM

## 2020-12-16 DIAGNOSIS — E782 Mixed hyperlipidemia: Secondary | ICD-10-CM | POA: Diagnosis not present

## 2020-12-16 DIAGNOSIS — I10 Essential (primary) hypertension: Secondary | ICD-10-CM | POA: Diagnosis not present

## 2021-01-03 DIAGNOSIS — R7302 Impaired glucose tolerance (oral): Secondary | ICD-10-CM | POA: Diagnosis not present

## 2021-01-03 DIAGNOSIS — E782 Mixed hyperlipidemia: Secondary | ICD-10-CM | POA: Diagnosis not present

## 2021-01-03 DIAGNOSIS — E039 Hypothyroidism, unspecified: Secondary | ICD-10-CM | POA: Diagnosis not present

## 2021-01-11 DIAGNOSIS — G4733 Obstructive sleep apnea (adult) (pediatric): Secondary | ICD-10-CM | POA: Diagnosis not present

## 2021-01-28 ENCOUNTER — Ambulatory Visit
Admission: RE | Admit: 2021-01-28 | Discharge: 2021-01-28 | Disposition: A | Discharge status: Home / Self Care | Payer: Medicare HMO | Source: Ambulatory Visit | Attending: Internal Medicine | Admitting: Internal Medicine

## 2021-01-28 ENCOUNTER — Other Ambulatory Visit: Payer: Self-pay

## 2021-01-28 DIAGNOSIS — N6489 Other specified disorders of breast: Secondary | ICD-10-CM

## 2021-01-28 DIAGNOSIS — R922 Inconclusive mammogram: Secondary | ICD-10-CM | POA: Diagnosis not present

## 2021-02-10 DIAGNOSIS — G4733 Obstructive sleep apnea (adult) (pediatric): Secondary | ICD-10-CM | POA: Diagnosis not present

## 2021-03-13 DIAGNOSIS — G4733 Obstructive sleep apnea (adult) (pediatric): Secondary | ICD-10-CM | POA: Diagnosis not present

## 2021-03-29 ENCOUNTER — Telehealth: Payer: Self-pay

## 2021-03-29 ENCOUNTER — Ambulatory Visit (INDEPENDENT_AMBULATORY_CARE_PROVIDER_SITE_OTHER): Payer: Medicare HMO | Admitting: Psychiatry

## 2021-03-29 ENCOUNTER — Encounter: Payer: Self-pay | Admitting: Psychiatry

## 2021-03-29 ENCOUNTER — Other Ambulatory Visit: Payer: Self-pay

## 2021-03-29 VITALS — BP 202/97 | HR 73 | Temp 97.6°F | Wt 197.8 lb

## 2021-03-29 DIAGNOSIS — F1290 Cannabis use, unspecified, uncomplicated: Secondary | ICD-10-CM | POA: Diagnosis not present

## 2021-03-29 DIAGNOSIS — Z79899 Other long term (current) drug therapy: Secondary | ICD-10-CM | POA: Diagnosis not present

## 2021-03-29 DIAGNOSIS — Z789 Other specified health status: Secondary | ICD-10-CM | POA: Diagnosis not present

## 2021-03-29 DIAGNOSIS — F411 Generalized anxiety disorder: Secondary | ICD-10-CM | POA: Diagnosis not present

## 2021-03-29 DIAGNOSIS — F330 Major depressive disorder, recurrent, mild: Secondary | ICD-10-CM

## 2021-03-29 DIAGNOSIS — R69 Illness, unspecified: Secondary | ICD-10-CM | POA: Diagnosis not present

## 2021-03-29 DIAGNOSIS — R030 Elevated blood-pressure reading, without diagnosis of hypertension: Secondary | ICD-10-CM | POA: Diagnosis not present

## 2021-03-29 MED ORDER — GABAPENTIN 100 MG PO CAPS: 100.0000 mg | ORAL_CAPSULE | Freq: Three times a day (TID) | ORAL | 1 refills | Status: DC

## 2021-03-29 NOTE — Telephone Encounter (Signed)
faxed and confirmed message to pcp about patient bp reading

## 2021-03-29 NOTE — Patient Instructions (Addendum)
[facility]coholics anonymous in [place], [address], [place], [place] [address]  Cannabis Use Disorder Cannabis use disorder occurs when marijuana use disrupts a person's daily life or causes health problems. This condition can be dangerous. The health problems this condition can cause include: Long-lasting problems with thinking and learning. These can be permanent in young people. Mental health problems, such as severe anxiety, paranoia, hallucinations, or schizophrenia. Dangerously high blood pressure and heart rate. Breathing problems. Problems with child development during and after pregnancy. People with this condition are also more likely to use other drugs. What are the causes? This condition is caused by using marijuana too much over time. It is not caused by using it only once in a while. Many people with this condition use marijuana because it gives them a feeling of extreme pleasure or relaxation. What increases the risk? This condition is more likely to develop in: Men. People with a family history of cannabis use disorder. People with mental health issues such as depression or post-traumatic stress disorder (PTSD). What are the signs or symptoms? Symptoms of this condition include: Addiction Using greater amounts of marijuana than you want to, or using marijuana for longer than you want to. Craving marijuana. Spending a lot of time getting marijuana, using it, or recovering from its effects. Having problems at work, at school, at home, or in relationships because of marijuana use. Giving up or cutting down on important life activities because of marijuana use. Using marijuana at times when it is dangerous, such as while you are driving a car. Needing more and more marijuana to get the same desired effect (building up a tolerance). Lack of motivation, known as amotivational syndrome, which leads to poor school and work performance. Physical problems A long-lasting  cough. Bronchitis. Emphysema. Throat and lung cancer. Mental problems Psychosis. Anxiety. Trouble sleeping. Increase in violent behavior in young people. Withdrawal problems You may have symptoms when you stop using marijuana. Symptoms include: Irritability or anger. Anxiety or restlessness. Trouble sleeping. Loss of appetite or weight loss. Aches and pains. Shakiness, sweating, or chills. How is this diagnosed? This condition is diagnosed with an assessment. During the assessment, your health care provider will ask about your marijuana use and how it affects your life. You will be diagnosed with the condition if you have had at least two symptoms of this condition within a 35-month period. How severe the condition is depends on how many symptoms you have. If you have two to three symptoms, your condition is mild. If you have four to five symptoms, your condition is moderate. If you have six or more symptoms, your condition is severe. Your health care provider may perform a physical exam or do lab tests to see if you have physical problems resulting from marijuana use. Your health care provider may also screen for drug use and refer you to a mental health professional for evaluation. How is this treated? Treatment for this condition is usually provided by mental health professionals with training in substance use disorders. Your treatment may involve: Counseling. This treatment is also called talk therapy. It is provided by substance use treatment counselors. A counselor can address the reasons you use marijuana and suggest ways to keep you from using it again. The goals of talk therapy are to: Find healthy activities to replace using marijuana. Identify and avoid the things that trigger your marijuana use. Help you learn how to handle cravings. Support groups. Support groups are led by people who have quit using  marijuana. They provide emotional support, advice, and guidance. Medicine.  Medicine is used to treat mental health issues that trigger marijuana use or that result from it. Follow these instructions at home: Lifestyle Make healthy lifestyle choices, such as: Eating a healthy diet. Getting enough exercise. Improving your stress-management skills.  General instructions Take over-the-counter, prescription medicines, and herbal remedies only as told by your health care provider. Check with your health care provider before starting any new medicines. Work with Higher education careers adviser or group to develop tools to keep you from using marijuana again (relapsing). Learn daily living skills and work Psychologist, occupational. Keep all follow-up visits as told by your health care provider. This is important. Where to find more information [employer] on Drug Abuse: motorcyclefax.com Centers for Disease Control and Prevention: http://www.wolf.info/ Substance Abuse and Mental Health Services Administration: ktimeonline.com Contact a health care provider if: You are not able to take your medicines as told. Your symptoms get worse. Get help right away if: You have serious thoughts about hurting yourself or others. If you ever feel like you may hurt yourself or others, or have thoughts about taking your own life, get help right away. You can go to your nearest emergency department or call: Your local emergency services (911 in the [place] A suicide crisis helpline, such as the [facility] at 631-048-0886 or 988 in the [place]. This is open 24 hours a day in the [place] Summary Cannabis use disorder is when using marijuana disrupts a person's daily life or causes health problems. This condition is caused by using marijuana too much over time. Treatment for this condition is usually provided by mental health professionals with training in substance use disorders. Treatment may involve counseling, support groups, or medicine. This information is not intended to replace advice given to you  by your health care provider. Make sure you discuss any questions you have with your health care provider. Document Revised: 09/01/2020 Document Reviewed: 01/07/2019 Elsevier Patient Education  2022 [place].     Alcohol Use Disorder Alcohol use disorder is a condition in which drinking disrupts daily life. People with this condition drink too much alcohol and cannot control their drinking. Alcohol use disorder can cause serious problems with physical health. It can affect the brain, heart, and other internal organs. This disorder can raise the risk for certain cancers and cause problems with mental health, such as depression or anxiety. What are the causes? This condition is caused by drinking too much alcohol over time. Some people with this condition drink to cope with or escape from negative life events. Others drink to relieve pain or symptoms of mental illness. What increases the risk? You are more likely to develop this condition if: You have a family history of alcohol use disorder. Your culture encourages drinking to the point of becoming drunk (intoxication). You had a mood or conduct disorder in childhood. You have been abused. You are an adolescent and you: Have poor performance in school. Have poor supervision or guidance. Act on impulse and like taking risks. What are the signs or symptoms? Symptoms of this condition include: Drinking more than you want to. Trying several times without success to drink less. Spending a lot of time thinking about alcohol, getting alcohol, drinking, or recovering from drinking. Continuing to drink even when it is causing serious problems in your daily life. Drinking when it is dangerous to drink, such as before driving a car. Needing more and more alcohol to get the same effect  you want (building up tolerance). Having symptoms of withdrawal when you stop drinking. Withdrawal symptoms may include: Trouble sleeping, leading to tiredness  (fatigue). Mood swings of depression and anxiety. Physical symptoms, such as a fast heart rate, rapid breathing, high blood pressure (hypertension), fever, cold sweats, or nausea. Seizures. Severe confusion. Feeling or seeing things that are not there (hallucinations). Shaking movements that you cannot control (tremors). How is this diagnosed? This condition is diagnosed with an assessment. Your health care provider may start by asking three or four questions about your drinking, or he or she may give you a simple test to take. This helps to get clear information from you. You may also have a physical exam or lab tests. You may be referred to a substance abuse counselor. How is this treated? With education, some people with alcohol use disorder are able to reduce their drinking. Many with this disorder cannot change their drinking behavior on their own and need help from substance use specialists. These specialists are counselors who can help diagnose how severe your disorder is and what type of treatment you need. Treatments may include: Detoxification. Detoxification involves quitting drinking with supervision and direction of health care providers. Your health care provider may prescribe prescription medicines within the first week to help lessen withdrawal symptoms. Alcohol withdrawal can be dangerous and life-threatening. Detoxification may be provided in a home, community, or primary care setting, or in a hospital or substance use treatment facility. Counseling. This may involve motivational interviewing (MI), family therapy, or cognitive behavioral therapy (CBT). It is provided by substance use treatment counselors or professional therapists. A counselor can address the things you can do to change your drinking behavior and how to maintain the changes. Talk therapy aims to: Identify your positive motivations to change. Identify and avoid the things that trigger your drinking. Help you learn  how to plan your behavior change. Develop support systems that can help you sustain the change. Medicines. Medicines can help treat this disorder by: Decreasing cravings. Decreasing the positive feeling you have when you drink. Causing an uncomfortable physical reaction when you drink (aversion therapy). Mutual help groups such as Alcoholics Anonymous (AA). These groups are led by people who have quit drinking. The groups provide emotional support, advice, and guidance. Some people with this condition benefit from a combination of treatments provided by specialized substance use treatment centers. Follow these instructions at home: Medicines Take over-the-counter and prescription medicines only as told by your health care provider. Ask before starting any new medicines, herbs, or supplements. General instructions Ask friends and family members to support your choice to stay sober. Avoid situations where alcohol is served. Create a plan to deal with tempting situations. Attend support groups regularly. Practice hobbies or activities you enjoy. Do not drink and drive. Keep all follow-up visits as told by your health care provider. This is important. How is this prevented? If you drink alcohol: Limit how much you use to: 0-1 drink a day for nonpregnant women. 0-2 drinks a day for men. Be aware of how much alcohol is in your drink. In the [place] one drink equals one 12 oz bottle of beer (355 mL), one 5 oz glass of wine (148 mL), or one 1 oz glass of hard liquor (44 mL). If you have a mental health condition, seek treatment. Develop a healthy lifestyle through: Meditation or deep breathing. Exercise. Spending time in nature. Listening to music. Talking with a trusted friend or family member. If you are an  adolescent: Do not drink alcohol. Avoid gatherings where you might be tempted. Do not be afraid to say no if someone offers you alcohol. Speak up about why you do not want to drink. Set  a positive example for others around you by not drinking. Build relationships with friends who do not drink. Where to find more information Substance Abuse and Mental Health Services Administration: SamedayNews.com.cy Alcoholics Anonymous: ShedSizes.ch Contact a health care provider if: You cannot take your medicines as told. Your symptoms get worse or you experience symptoms of withdrawal when you stop drinking. You start drinking again (relapse) and your symptoms get worse. Get help right away if: You have thoughts about hurting yourself or others. If you ever feel like you may hurt yourself or others, or have thoughts about taking your own life, get help right away. Go to your nearest emergency department or: Call your local emergency services (911 in the [place] Call a suicide crisis helpline, such as the [facility] at 8597617130 or 988 in the [place]. This is open 24 hours a day in the [place] Text the Crisis Text Line at 757 499 7468 (in the [place].). Summary Alcohol use disorder is a condition in which drinking disrupts daily life. People with this condition drink too much alcohol and cannot control their drinking. Treatment may include detoxification, counseling, medicines, and support groups. Ask friends and family members to support you. Avoid situations where alcohol is served. Get help right away if you have thoughts about hurting yourself or others. This information is not intended to replace advice given to you by your health care provider. Make sure you discuss any questions you have with your health care provider. Document Revised: 09/01/2020 Document Reviewed: 12/26/2018 Elsevier Patient Education  [place]. [phone]  Gabapentin Capsules or Tablets What is this medication? GABAPENTIN (GA ba pen tin) treats nerve pain. It may also be used to prevent and control seizures in people with epilepsy. It works by calming overactive nerves in your body. This  medicine may be used for other purposes; ask your health care provider or pharmacist if you have questions. COMMON BRAND NAME(S): Active-PAC with Gabapentin, Orpha Bur, Gralise, Neurontin What should I tell my care team before I take this medication? They need to know if you have any of these conditions: Alcohol or substance use disorder Kidney disease Lung or breathing disease Suicidal thoughts, plans, or attempt; a previous suicide attempt by you or a family member An unusual or allergic reaction to gabapentin, other medications, foods, dyes, or preservatives Pregnant or trying to get pregnant Breast-feeding How should I use this medication? Take this medication by mouth with a glass of water. Follow the directions on the prescription label. You can take it with or without food. If it upsets your stomach, take it with food. Take your medication at regular intervals. Do not take it more often than directed. Do not stop taking except on your care team's advice. If you are directed to break the 600 or 800 mg tablets in half as part of your dose, the extra half tablet should be used for the next dose. If you have not used the extra half tablet within 28 days, it should be thrown away. A special MedGuide will be given to you by the pharmacist with each prescription and refill. Be sure to read this information carefully each time. Talk to your care team about the use of this medication in children. While this medication may be prescribed for children as  young as 3 years for selected conditions, precautions do apply. Overdosage: If you think you have taken too much of this medicine contact a poison control center or emergency room at once. NOTE: This medicine is only for you. Do not share this medicine with others. What if I miss a dose? If you miss a dose, take it as soon as you can. If it is almost time for your next dose, take only that dose. Do not take double or extra doses. What may interact with  this medication? Alcohol Antihistamines for allergy, cough, and cold Certain medications for anxiety or sleep Certain medications for depression like amitriptyline, fluoxetine, sertraline Certain medications for seizures like phenobarbital, primidone Certain medications for stomach problems General anesthetics like halothane, isoflurane, methoxyflurane, propofol Local anesthetics like lidocaine, pramoxine, tetracaine Medications that relax muscles for surgery Opioid medications for pain Phenothiazines like chlorpromazine, mesoridazine, prochlorperazine, thioridazine This list may not describe all possible interactions. Give your health care provider a list of all the medicines, herbs, non-prescription drugs, or dietary supplements you use. Also tell them if you smoke, drink alcohol, or use illegal drugs. Some items may interact with your medicine. What should I watch for while using this medication? Visit your care team for regular checks on your progress. You may want to keep a record at home of how you feel your condition is responding to treatment. You may want to share this information with your care team at each visit. You should contact your care team if your seizures get worse or if you have any new types of seizures. Do not stop taking this medication or any of your seizure medications unless instructed by your care team. Stopping your medication suddenly can increase your seizures or their severity. This medication may cause serious skin reactions. They can happen weeks to months after starting the medication. Contact your care team right away if you notice fevers or flu-like symptoms with a rash. The rash may be red or purple and then turn into blisters or peeling of the skin. Or, you might notice a red rash with swelling of the face, lips or lymph nodes in your neck or under your arms. Wear a medical identification bracelet or chain if you are taking this medication for seizures. Carry a  card that lists all your medications. This medication may affect your coordination, reaction time, or judgment. Do not drive or operate machinery until you know how this medication affects you. Sit up or stand slowly to reduce the risk of dizzy or fainting spells. Drinking alcohol with this medication can increase the risk of these side effects. Your mouth may get dry. Chewing sugarless gum or sucking hard candy, and drinking plenty of water may help. Watch for new or worsening thoughts of suicide or depression. This includes sudden changes in mood, behaviors, or thoughts. These changes can happen at any time but are more common in the beginning of treatment or after a change in dose. Call your care team right away if you experience these thoughts or worsening depression. If you become pregnant while using this medication, you may enroll in the [place] Pregnancy Registry by calling [phone]. This registry collects information about the safety of antiepileptic medication use during pregnancy. What side effects may I notice from receiving this medication? Side effects that you should report to your care team as soon as possible: Allergic reactions or angioedema--skin rash, itching, hives, swelling of the face, eyes, lips, tongue, arms, or legs, trouble swallowing  or breathing Rash, fever, and swollen lymph nodes Thoughts of suicide or self harm, worsening mood, feelings of depression Trouble breathing Unusual changes in mood or behavior in children after use such as difficulty concentrating, hostility, or restlessness Side effects that usually do not require medical attention (report to your care team if they continue or are bothersome): Dizziness Drowsiness Nausea Swelling of ankles, feet, or hands Vomiting This list may not describe all possible side effects. Call your doctor for medical advice about side effects. You may report side effects to FDA at  1-800-FDA-1088. Where should I keep my medication? Keep out of reach of children and pets. Store at room temperature between 15 and 30 degrees C (59 and 86 degrees F). Get rid of any unused medication after the expiration date. This medication may cause accidental overdose and death if taken by other adults, children, or pets. To get rid of medications that are no longer needed or have expired: Take the medication to a medication take-back program. Check with your pharmacy or law enforcement to find a location. If you cannot return the medication, check the label or package insert to see if the medication should be thrown out in the garbage or flushed down the toilet. If you are not sure, ask your care team. If it is safe to put it in the trash, empty the medication out of the container. Mix the medication with cat litter, dirt, coffee grounds, or other unwanted substance. Seal the mixture in a bag or container. Put it in the trash. NOTE: This sheet is a summary. It may not cover all possible information. If you have questions about this medicine, talk to your doctor, pharmacist, or health care provider.  2022 Elsevier/Gold Standard (2020-08-09 00:00:00)

## 2021-03-29 NOTE — Telephone Encounter (Signed)
Thank you :)
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
## 2021-03-30 DIAGNOSIS — Z79899 Other long term (current) drug therapy: Secondary | ICD-10-CM | POA: Insufficient documentation

## 2021-03-30 DIAGNOSIS — R030 Elevated blood-pressure reading, without diagnosis of hypertension: Secondary | ICD-10-CM | POA: Insufficient documentation

## 2021-03-31 ENCOUNTER — Other Ambulatory Visit
Admission: RE | Admit: 2021-03-31 | Discharge: 2021-03-31 | Disposition: A | Discharge status: Home / Self Care | Payer: Medicare HMO | Attending: Psychiatry | Admitting: Psychiatry

## 2021-03-31 DIAGNOSIS — Z79899 Other long term (current) drug therapy: Secondary | ICD-10-CM | POA: Diagnosis not present

## 2021-03-31 DIAGNOSIS — E039 Hypothyroidism, unspecified: Secondary | ICD-10-CM | POA: Diagnosis not present

## 2021-03-31 DIAGNOSIS — R69 Illness, unspecified: Secondary | ICD-10-CM | POA: Diagnosis not present

## 2021-03-31 DIAGNOSIS — F330 Major depressive disorder, recurrent, mild: Secondary | ICD-10-CM | POA: Diagnosis not present

## 2021-03-31 DIAGNOSIS — I10 Essential (primary) hypertension: Secondary | ICD-10-CM | POA: Diagnosis not present

## 2021-03-31 DIAGNOSIS — Z789 Other specified health status: Secondary | ICD-10-CM | POA: Diagnosis not present

## 2021-03-31 DIAGNOSIS — F411 Generalized anxiety disorder: Secondary | ICD-10-CM | POA: Insufficient documentation

## 2021-03-31 DIAGNOSIS — F1290 Cannabis use, unspecified, uncomplicated: Secondary | ICD-10-CM | POA: Insufficient documentation

## 2021-03-31 DIAGNOSIS — E782 Mixed hyperlipidemia: Secondary | ICD-10-CM | POA: Diagnosis not present

## 2021-03-31 LAB — HEPATIC FUNCTION PANEL
ALT: 25 U/L (ref 0–44)
AST: 24 U/L (ref 15–41)
Albumin: 4.6 g/dL (ref 3.5–5.0)
Alkaline Phosphatase: 78 U/L (ref 38–126)
Bilirubin, Direct: 0.1 mg/dL (ref 0.0–0.2)
Indirect Bilirubin: 0.7 mg/dL (ref 0.3–0.9)
Total Bilirubin: 0.8 mg/dL (ref 0.3–1.2)
Total Protein: 8 g/dL (ref 6.5–8.1)

## 2021-03-31 LAB — SODIUM: Sodium: 139 mmol/L (ref 135–145)

## 2021-04-07 LAB — URINE DRUGS OF ABUSE SCREEN W ALC, ROUTINE (REF LAB)
Amphetamines, Urine: NEGATIVE ng/mL
Barbiturate, Ur: NEGATIVE ng/mL
Benzodiazepine Quant, Ur: NEGATIVE ng/mL
Cocaine (Metab.): NEGATIVE ng/mL
Ethanol U, Quan: NEGATIVE %
Methadone Screen, Urine: NEGATIVE ng/mL
Opiate Quant, Ur: NEGATIVE ng/mL
Phencyclidine, Ur: NEGATIVE ng/mL
Propoxyphene, Urine: NEGATIVE ng/mL

## 2021-04-07 LAB — PANEL 799049
CARBOXY THC GC/MS CONF: 27 ng/mL
Cannabinoid GC/MS, Ur: POSITIVE — AB

## 2021-04-13 DIAGNOSIS — G4733 Obstructive sleep apnea (adult) (pediatric): Secondary | ICD-10-CM | POA: Diagnosis not present

## 2021-04-14 ENCOUNTER — Encounter: Payer: Self-pay | Admitting: Student in an Organized Health Care Education/Training Program

## 2021-04-14 ENCOUNTER — Ambulatory Visit
Payer: Medicare HMO | Attending: Student in an Organized Health Care Education/Training Program | Admitting: Student in an Organized Health Care Education/Training Program

## 2021-04-14 ENCOUNTER — Other Ambulatory Visit: Payer: Self-pay

## 2021-04-14 VITALS — BP 153/89 | HR 71 | Temp 97.3°F | Resp 15 | Ht 62.0 in | Wt 197.0 lb

## 2021-04-14 DIAGNOSIS — G8929 Other chronic pain: Secondary | ICD-10-CM | POA: Insufficient documentation

## 2021-04-14 DIAGNOSIS — G894 Chronic pain syndrome: Secondary | ICD-10-CM | POA: Insufficient documentation

## 2021-04-14 DIAGNOSIS — M5416 Radiculopathy, lumbar region: Secondary | ICD-10-CM | POA: Diagnosis not present

## 2021-04-14 DIAGNOSIS — F411 Generalized anxiety disorder: Secondary | ICD-10-CM | POA: Diagnosis not present

## 2021-04-14 DIAGNOSIS — M5136 Other intervertebral disc degeneration, lumbar region: Secondary | ICD-10-CM | POA: Insufficient documentation

## 2021-04-14 DIAGNOSIS — R69 Illness, unspecified: Secondary | ICD-10-CM | POA: Diagnosis not present

## 2021-04-14 DIAGNOSIS — M47816 Spondylosis without myelopathy or radiculopathy, lumbar region: Secondary | ICD-10-CM | POA: Diagnosis not present

## 2021-04-14 DIAGNOSIS — F330 Major depressive disorder, recurrent, mild: Secondary | ICD-10-CM | POA: Insufficient documentation

## 2021-04-14 MED ORDER — DICLOFENAC SODIUM 50 MG PO TBEC: 50.0000 mg | DELAYED_RELEASE_TABLET | Freq: Two times a day (BID) | ORAL | 0 refills | Status: AC

## 2021-04-14 NOTE — Progress Notes (Signed)
PROVIDER NOTE: Information contained herein reflects review and annotations entered in association with encounter. Interpretation of such information and data should be left to medically-trained personnel. Information provided to patient can be located elsewhere in the medical record under "Patient Instructions". Document created using STT-dictation technology, any transcriptional errors that may result from process are unintentional.    Patient: Shannon Crawford  Service Category: E/M  Provider: Edward Jolly, MD  DOB: [birth year]  DOS: 04/14/2021  Specialty: Interventional Pain Management  MRN: [id number]  Setting: Ambulatory outpatient  PCP: Margaretann Loveless, MD  Type: Established Patient    Referring Provider: Margaretann Loveless, MD  Location: Office  Delivery: Face-to-face     HPI  Ms. Shannon Crawford, a 66 y.o. year old female, is here today because of her Lumbar spondylosis [M47.816]. Ms. Shannon Crawford primary complain today is Back Pain (lower) Last encounter: My last encounter with her was on 12/07/2020. Pertinent problems: Ms. Shannon Crawford has Lumbar degenerative disc disease; Lumbar facet arthropathy; Bilateral occipital neuralgia; Lumbar radiculopathy; Piriformis syndrome of right side; and Chronic pain syndrome on their pertinent problem list. Pain Assessment: Severity of Acute pain is reported as a 1 /10. Location: Back Right, Lower/denies. Onset: In the past 7 days. Quality: Tightness. Timing: Constant. Modifying factor(s):  Marland Kitchen Vitals:  height is 5\' 2"  (1.575 m) and weight is 197 lb (89.4 kg). Her temporal temperature is 97.3 F (36.3 C) (abnormal). Her blood pressure is 153/89 (abnormal) and her pulse is 71. Her respiration is 15 and oxygen saturation is 98%.   Reason for encounter:   Increased right lower back pain.  Acute on chronic.  Started approximately 1 week ago.  She states that she was stretching when she felt like she pulled a muscle.  No radiation into her legs.  Not likely radicular.  Most likely  musculoskeletal lumbar paraspinal muscle strain likely iliopsoas.  Recommend heat, rest, diclofenac for the next 10 days and increasing gabapentin to 100 mg during the day and 200 mg nightly.  Follow-up as needed.  ROS  Constitutional: Denies any fever or chills Gastrointestinal: No reported hemesis, hematochezia, vomiting, or acute GI distress Musculoskeletal:  Right low back pain Neurological: No reported episodes of acute onset apraxia, aphasia, dysarthria, agnosia, amnesia, paralysis, loss of coordination, or loss of consciousness  Medication Review  DULoxetine, diclofenac, gabapentin, levothyroxine, losartan, rosuvastatin, tizanidine, and zolpidem  History Review  Allergy: Ms. Shannon Crawford is allergic to catfish [fish allergy]. Drug: Ms. Shannon Crawford  reports no history of drug use. Alcohol:  reports current alcohol use of about 12.0 standard drinks per week. Tobacco:  reports that she has never smoked. She has never used smokeless tobacco. Social: Ms. Shannon Crawford  reports that she has never smoked. She has never used smokeless tobacco. She reports current alcohol use of about 12.0 standard drinks per week. She reports that she does not use drugs. Medical:  has a past medical history of Anxiety, Fracture, Heart murmur, Hypothyroid, and Thyroid disease. Surgical: Ms. Shannon Crawford  has a past surgical history that includes Abdominal surgery and Breast biopsy (Left, 10+ years). Family: family history includes Alcohol abuse in her father; Anxiety disorder in her sister; Breast cancer in her cousin; Breast cancer (age of onset: 28) in her sister; Hypertension in her mother and sister; Prostate cancer in her brother.  Laboratory Chemistry Profile   Renal No results found for: BUN, CREATININE, LABCREA, BCR, GFR, GFRAA, GFRNONAA, LABVMA, EPIRU, [id number], NOREPRU, [id number], DOPARU, [id number]  Hepatic Lab Results  Component Value Date  AST 24 03/31/2021   ALT 25 03/31/2021   ALBUMIN 4.6 03/31/2021   ALKPHOS 78  03/31/2021    Electrolytes Lab Results  Component Value Date   NA 139 03/31/2021    Bone No results found for: VD25OH, [id number], [id number], [id number], 25OHVITD1, 25OHVITD2, 25OHVITD3, TESTOFREE, TESTOSTERONE  Inflammation (CRP: Acute Phase) (ESR: Chronic Phase) Lab Results  Component Value Date   ESRSEDRATE 6 07/25/2012         Note: Above Lab results reviewed.  Recent Imaging Review  MM DIAG BREAST TOMO BILATERAL CLINICAL DATA:  Patient for follow-up of probably benign left breast asymmetry.  EXAM: DIGITAL DIAGNOSTIC BILATERAL MAMMOGRAM WITH TOMOSYNTHESIS AND CAD  TECHNIQUE: Bilateral digital diagnostic mammography and breast tomosynthesis was performed. The images were evaluated with computer-aided detection.  COMPARISON:  Previous exam(s).  ACR Breast Density Category b: There are scattered areas of fibroglandular density.  FINDINGS: Grossly unchanged asymmetry within the central left breast anterior depth. No new masses, calcifications or distortion identified within either breast.  IMPRESSION: No mammographic evidence for malignancy. Stable asymmetry left breast compatible with benign process.  RECOMMENDATION: Screening mammogram in one year.(Code:SM-B-01Y)  I have discussed the findings and recommendations with the patient. If applicable, a reminder letter will be sent to the patient regarding the next appointment.  BI-RADS CATEGORY  2: Benign.  Electronically Signed   By: Annia Belt M.D.   On: 01/28/2021 13:30 Note: Reviewed        Physical Exam  General appearance: Well nourished, well developed, and well hydrated. In no apparent acute distress Mental status: Alert, oriented x 3 (person, place, & time)       Respiratory: No evidence of acute respiratory distress Eyes: PERLA Vitals: BP (!) 153/89    Pulse 71    Temp (!) 97.3 F (36.3 C) (Temporal)    Resp 15    Ht 5\' 2"  (1.575 m)    Wt 197 lb (89.4 kg)    SpO2 98%    BMI 36.03 kg/m  BMI:  Estimated body mass index is 36.03 kg/m as calculated from the following:   Height as of this encounter: 5\' 2"  (1.575 m).   Weight as of this encounter: 197 lb (89.4 kg). Ideal: Ideal body weight: 50.1 kg (110 lb 7.2 oz) Adjusted ideal body weight: 65.8 kg (145 lb 1.1 oz)  Right low back paraspinal tenderness  Assessment   Status Diagnosis  Controlled Controlled Controlled 1. Lumbar spondylosis   2. Lumbar facet arthropathy   3. Lumbar degenerative disc disease   4. Lumbar radiculopathy   5. Chronic radicular lumbar pain (RIGHT>LEFT)   6. Chronic pain syndrome   7. GAD (generalized anxiety disorder)   8. MDD (major depressive disorder), recurrent episode, mild (HCC)      Updated Problems: Problem  Chronic Pain Syndrome  Lumbar Radiculopathy  Piriformis Syndrome of Right Side  Lumbar Degenerative Disc Disease  Lumbar Facet Arthropathy  Bilateral Occipital Neuralgia    Plan of Care    Shannon Crawford has a current medication list which includes the following long-term medication(s): duloxetine, gabapentin, levothyroxine, losartan, rosuvastatin, and zolpidem.  Pharmacotherapy (Medications Ordered): Meds ordered this encounter  Medications   diclofenac (VOLTAREN) 50 MG EC tablet    Sig: Take 1 tablet (50 mg total) by mouth 2 (two) times daily for 10 days.    Dispense:  20 tablet    Refill:  0   Orders:  Orders Placed This Encounter  Procedures   LUMBAR FACET(MEDIAL BRANCH  NERVE BLOCK) MBNB    Standing Status:   Standing    Number of Occurrences:   1    Standing Expiration Date:   08/12/2021    Scheduling Instructions:     Procedure: Lumbar facet block (AKA.: Lumbosacral medial branch nerve block)     Side: RIGHT     Level: L3-4, L5-S1 Facets (L3, L4, L5, Medial Branch Nerves)     Sedation: Patient's choice.     Timeframe: PRN    Order Specific Question:   Where will this procedure be performed?    Answer:   [facility]   Follow-up plan:   Return  if symptoms worsen or fail to improve.    Recent Visits No visits were found meeting these conditions. Showing recent visits within past 90 days and meeting all other requirements Today's Visits Date Type Provider Dept  04/14/21 Office Visit Edward Jolly, MD [facility]  Showing today's visits and meeting all other requirements Future Appointments No visits were found meeting these conditions. Showing future appointments within next 90 days and meeting all other requirements  I discussed the assessment and treatment plan with the patient. The patient was provided an opportunity to ask questions and all were answered. The patient agreed with the plan and demonstrated an understanding of the instructions.  Patient advised to call back or seek an in-person evaluation if the symptoms or condition worsens.  Duration of encounter: .  Note by: Edward Jolly, MD Date: 04/14/2021; Time: 9:56 AM

## 2021-04-14 NOTE — Patient Instructions (Addendum)
For Gabapentin take 100 mg in the morning and try 200 mg in the evening Diclofenac for 10 days Heat to back and voltaren gel Facet Blocks Patient Information  Description: The facets are joints in the spine between the vertebrae.  Like any joints in the body, facets can become irritated and painful.  Arthritis can also effect the facets.  By injecting steroids and local anesthetic in and around these joints, we can temporarily block the nerve supply to them.  Steroids act directly on irritated nerves and tissues to reduce selling and inflammation which often leads to decreased pain.  Facet blocks may be done anywhere along the spine from the neck to the low back depending upon the location of your pain.   After numbing the skin with local anesthetic (like Novocaine), a small needle is passed onto the facet joints under x-ray guidance.  You may experience a sensation of pressure while this is being done.  The entire block usually lasts about 15-25 minutes.   Conditions which may be treated by facet blocks:  Low back/buttock pain Neck/shoulder pain Certain types of headaches  Preparation for the injection:  Do not eat any solid food or dairy products within 8 hours of your appointment. You may drink clear liquid up to 3 hours before appointment.  Clear liquids include water, black coffee, juice or soda.  No milk or cream please. You may take your regular medication, including pain medications, with a sip of water before your appointment.  Diabetics should hold regular insulin (if taken separately) and take 1/2 normal NPH dose the morning of the procedure.  Carry some sugar containing items with you to your appointment. A driver must accompany you and be prepared to drive you home after your procedure. Bring all your current medications with you. An IV may be inserted and sedation may be given at the discretion of the physician. A blood pressure cuff, EKG and other monitors will often be applied  during the procedure.  Some patients may need to have extra oxygen administered for a short period. You will be asked to provide medical information, including your allergies and medications, prior to the procedure.  We must know immediately if you are taking blood thinners (like Coumadin/Warfarin) or if you are allergic to IV iodine contrast (dye).  We must know if you could possible be pregnant.  Possible side-effects:  Bleeding from needle site Infection (rare, may require surgery) Nerve injury (rare) Numbness & tingling (temporary) Difficulty urinating (rare, temporary) Spinal headache (a headache worse with upright posture) Light-headedness (temporary) Pain at injection site (serveral days) Decreased blood pressure (rare, temporary) Weakness in arm/leg (temporary) Pressure sensation in back/neck (temporary)   Call if you experience:  Fever/chills associated with headache or increased back/neck pain Headache worsened by an upright position New onset, weakness or numbness of an extremity below the injection site Hives or difficulty breathing (go to the emergency room) Inflammation or drainage at the injection site(s) Severe back/neck pain greater than usual New symptoms which are concerning to you  Please note:  Although the local anesthetic injected can often make your back or neck feel good for several hours after the injection, the pain will likely return. It takes 3-7 days for steroids to work.  You may not notice any pain relief for at least one week.  If effective, we will often do a series of 2-3 injections spaced 3-6 weeks apart to maximally decrease your pain.  After the initial series, you may be  a candidate for a more permanent nerve block of the facets.  If you have any questions, please call #[phone]) [facility]

## 2021-04-14 NOTE — Progress Notes (Signed)
Safety precautions to be maintained throughout the outpatient stay will include: orient to surroundings, keep bed in low position, maintain call bell within reach at all times, provide assistance with transfer out of bed and ambulation.  

## 2021-04-28 ENCOUNTER — Ambulatory Visit: Payer: Medicare HMO | Admitting: Psychiatry

## 2021-05-02 DIAGNOSIS — E782 Mixed hyperlipidemia: Secondary | ICD-10-CM | POA: Diagnosis not present

## 2021-05-02 DIAGNOSIS — Z20822 Contact with and (suspected) exposure to covid-19: Secondary | ICD-10-CM | POA: Diagnosis not present

## 2021-05-02 DIAGNOSIS — I10 Essential (primary) hypertension: Secondary | ICD-10-CM | POA: Diagnosis not present

## 2021-05-02 DIAGNOSIS — E039 Hypothyroidism, unspecified: Secondary | ICD-10-CM | POA: Diagnosis not present

## 2021-05-02 DIAGNOSIS — Z03818 Encounter for observation for suspected exposure to other biological agents ruled out: Secondary | ICD-10-CM | POA: Diagnosis not present

## 2021-05-03 ENCOUNTER — Ambulatory Visit: Payer: Medicare HMO | Admitting: Psychiatry

## 2021-05-03 ENCOUNTER — Telehealth: Payer: Self-pay

## 2021-05-03 ENCOUNTER — Other Ambulatory Visit: Payer: Self-pay

## 2021-05-03 ENCOUNTER — Encounter: Payer: Self-pay | Admitting: Psychiatry

## 2021-05-03 VITALS — BP 168/97 | HR 71 | Temp 98.1°F | Wt 200.0 lb

## 2021-05-03 DIAGNOSIS — Z79899 Other long term (current) drug therapy: Secondary | ICD-10-CM | POA: Diagnosis not present

## 2021-05-03 DIAGNOSIS — Z789 Other specified health status: Secondary | ICD-10-CM | POA: Diagnosis not present

## 2021-05-03 DIAGNOSIS — F411 Generalized anxiety disorder: Secondary | ICD-10-CM | POA: Diagnosis not present

## 2021-05-03 DIAGNOSIS — F3341 Major depressive disorder, recurrent, in partial remission: Secondary | ICD-10-CM | POA: Diagnosis not present

## 2021-05-03 DIAGNOSIS — R69 Illness, unspecified: Secondary | ICD-10-CM | POA: Diagnosis not present

## 2021-05-03 DIAGNOSIS — F1290 Cannabis use, unspecified, uncomplicated: Secondary | ICD-10-CM | POA: Diagnosis not present

## 2021-05-03 MED ORDER — ZOLPIDEM TARTRATE 5 MG PO TABS: 5.0000 mg | ORAL_TABLET | Freq: Every evening | ORAL | 1 refills | Status: DC | PRN

## 2021-05-03 MED ORDER — GABAPENTIN 100 MG PO CAPS: 100.0000 mg | ORAL_CAPSULE | Freq: Three times a day (TID) | ORAL | 0 refills | Status: DC

## 2021-05-03 MED ORDER — DULOXETINE HCL 60 MG PO CPEP: 60.0000 mg | ORAL_CAPSULE | Freq: Every day | ORAL | 0 refills | Status: DC

## 2021-05-03 NOTE — Progress Notes (Signed)
BH MD OP Progress Note  05/03/2021 3:12 PM Shannon Crawford  MRN:  [id number]  Chief Complaint:  Chief Complaint  Patient presents with   Follow-up: 66 year old female, GAD, MDD, presented for medication management.   HPI: Shannon Crawford is a 66 year old female with history of GAD, MDD, significant use of alcohol, use of cannabis, high risk medication use, hypertension, chronic pain syndrome, hypothyroidism, hyperlipidemia was evaluated in office today.  Patient today reports she is currently improving on the addition of gabapentin.  Her anxiety has improved.  Patient reports her low mood as well as lack of motivation also has improved.  She continues to struggle with mild sleep issues.  However when she takes the Ambien it helps.  She however has been limiting the use of Ambien.  Patient reports she is tolerating the gabapentin well.  She does forget to take the midday dosage of the gabapentin at times.  Patient reports she struggles with concentration problems when she does her house chores, read a book and so on.  She however reports she does a lot of quilting and when she does her quilting she has no concentration trouble.  She recently participated in a quilt show and she enjoyed it well.  Patient denies any suicidality, homicidality or perceptual disturbances.  Continues to be compliant on Cymbalta.  Denies side effects.  Patient denies any other concerns today.  Visit Diagnosis:    ICD-10-CM   1. GAD (generalized anxiety disorder)  F41.1 gabapentin (NEURONTIN) 100 MG capsule    DULoxetine (CYMBALTA) 60 MG capsule    2. MDD (major depressive disorder), recurrent, in partial remission (HCC)  F33.41 gabapentin (NEURONTIN) 100 MG capsule    DULoxetine (CYMBALTA) 60 MG capsule    zolpidem (AMBIEN) 5 MG tablet    3. Significant use of alcohol  Z78.9     4. Use of cannabis  F12.90     5. High risk medication use  Z79.899       Past Psychiatric History: Reviewed past psychiatric  history from progress note on 03/29/2021.  Past trials of medications like Lexapro, Zoloft, duloxetine, zolpidem, Rexulti, Xanax, Topamax.  Past Medical History:  Past Medical History:  Diagnosis Date   Anxiety    Fracture    ankle   Heart murmur    Hypothyroid    Thyroid disease     Past Surgical History:  Procedure Laterality Date   ABDOMINAL SURGERY     tummy tuck   BREAST BIOPSY Left 10+ years   Negative- core bx    Family Psychiatric History: Reviewed family psychiatric history from progress note on 03/29/2021.  Family History:  Family History  Problem Relation Age of Onset   Hypertension Mother    Alcohol abuse Father    Anxiety disorder Sister    Prostate cancer Brother    Hypertension Sister    Breast cancer Sister 24   Breast cancer Cousin     Social History: Reviewed social history from progress note on 03/29/2021. Social History   Socioeconomic History   Marital status: Married    Spouse name: Shannon Crawford   Number of children: 2   Years of education: Not on file   Highest education level: Some college, no degree  Occupational History   Not on file  Tobacco Use   Smoking status: Never   Smokeless tobacco: Never  Vaping Use   Vaping Use: Never used  Substance and Sexual Activity   Alcohol use: Yes    Alcohol/week: 12.0  standard drinks    Types: 12 Cans of beer per week   Drug use: No   Sexual activity: Yes  Other Topics Concern   Not on file  Social History Narrative   Not on file   Social Determinants of Health   Financial Resource Strain: Not on file  Food Insecurity: Not on file  Transportation Needs: Not on file  Physical Activity: Not on file  Stress: Not on file  Social Connections: Not on file    Allergies:  Allergies  Allergen Reactions   Catfish [Fish Allergy] Nausea And Vomiting    Metabolic Disorder Labs: No results found for: HGBA1C, MPG No results found for: PROLACTIN No results found for: CHOL, TRIG, HDL, CHOLHDL, VLDL,  LDLCALC No results found for: TSH  Therapeutic Level Labs: No results found for: LITHIUM No results found for: VALPROATE No components found for:  CBMZ  Current Medications: Current Outpatient Medications  Medication Sig Dispense Refill   B Complex-C (SUPER B COMPLEX PO) Take by mouth.     Cholecalciferol (D-3-5) 125 MCG (5000 UT) capsule Take 5,000 Units by mouth daily.     levothyroxine (SYNTHROID) 88 MCG tablet Take 88 mcg by mouth daily before breakfast.     losartan (COZAAR) 50 MG tablet Take 50 mg by mouth daily.     Omega-3 Fatty Acids (FISH OIL) 1000 MG CAPS Take by mouth.     rosuvastatin (CRESTOR) 20 MG tablet 20 mg daily.     DULoxetine (CYMBALTA) 60 MG capsule Take 1 capsule (60 mg total) by mouth daily. 90 capsule 0   gabapentin (NEURONTIN) 100 MG capsule Take 1 capsule (100 mg total) by mouth 3 (three) times daily. 270 capsule 0   zolpidem (AMBIEN) 5 MG tablet Take 1 tablet (5 mg total) by mouth at bedtime as needed for sleep. 30 tablet 1   No current facility-administered medications for this visit.     Musculoskeletal: Strength & Muscle Tone: within normal limits Gait & Station: normal Patient leans: N/A  Psychiatric Specialty Exam: Review of Systems  Psychiatric/Behavioral:  Positive for decreased concentration, dysphoric mood and sleep disturbance.   All other systems reviewed and are negative.  Blood pressure (!) 168/97, pulse 71, temperature 98.1 F (36.7 C), temperature source Temporal, weight 200 lb (90.7 kg).Body mass index is 36.58 kg/m.  General Appearance: Casual  Eye Contact:  Fair  Speech:  Clear and Coherent  Volume:  Normal  Mood:  Depressed improving  Affect:  Full Range  Thought Process:  Goal Directed and Descriptions of Associations: Intact  Orientation:  Full (Time, Place, and Person)  Thought Content: Logical   Suicidal Thoughts:  No  Homicidal Thoughts:  No  Memory:  Immediate;   Fair Recent;   Fair Remote;   Fair  Judgement:   Fair  Insight:  Fair  Psychomotor Activity:  Normal  Concentration:  Concentration: Fair and Attention Span: Fair  Recall:  [employer] of Knowledge: Fair  Language: Fair  Akathisia:  No  Handed:  Right  AIMS (if indicated): done, 0  Assets:  Communication Skills Desire for Improvement Resilience Social Support  ADL's:  Intact  Cognition: WNL  Sleep:   improving   Screenings: AUDIT    Flowsheet Row Office Visit from 03/29/2021 in [facility]  Alcohol Use Disorder Identification Test Final Score (AUDIT) 13      GAD-7    Flowsheet Row Office Visit from 05/03/2021 in [facility] Office Visit from  03/29/2021 in [facility]  Total GAD-7 Score 0 5      PHQ2-9    Flowsheet Row Office Visit from 05/03/2021 in [facility] Office Visit from 03/29/2021 in [facility] Office Visit from 10/26/2020 in [facility] Office Visit from 10/06/2015 in [facility] Office Visit from 09/07/2015 in [facility]  PHQ-2 Total Score 2 2 3  0 0  PHQ-9 Total Score 9 8 16  -- --        Assessment and Plan: AIRAH BOSMA is a 66 year old female, married, lives in [place], has a history of MDD, hypothyroidism, chronic pain syndrome, hypertension, hyperlipidemia was evaluated in office today.  Patient is currently improving although she continues to have concentration problems as noted above.  Discussed plan as noted below.  Plan GAD-improving Cymbalta 60 mg p.o. daily Gabapentin 100 mg p.o. 3 times daily.  Advised patient she could use 2 of the 100 mg in the evening to help with compliance. Patient has been referred for CBT-has appointment coming up.  MDD-in partial remission Continue Cymbalta 60 mg p.o. daily Continue zolpidem 5 mg  p.o. nightly as needed Referred for CBT  Significant use of alcohol-improving Referred for CBT  Use of cannabis-improving Provided counseling  High risk medication use-reviewed and discussed the following labs including urine drug screen-dated 03/31/2021-positive for cannabinoids, hepatic function panel-within normal limits, sodium within normal limits.  Patient with attention and focus problem, will consider adding Wellbutrin in the future and taper off Cymbalta.  However patient to participate in psychotherapy first.  Patient with elevated blood pressure reading to continue to follow up with her primary care provider.  Follow-up in clinic in 4 to 6 weeks or sooner if needed.  Collaboration of Care: Collaboration of Care: Referral or follow-up with counselor/therapist AEB patient encouraged to follow up with therapist.  Patient/Guardian was advised Release of Information must be obtained prior to any record release in order to collaborate their care with an outside provider. Patient/Guardian was advised if they have not already done so to contact the registration department to sign all necessary forms in order for [place] to release information regarding their care.   Consent: Patient/Guardian gives verbal consent for treatment and assignment of benefits for services provided during this visit. Patient/Guardian expressed understanding and agreed to proceed.   This note was generated in part or whole with voice recognition software. Voice recognition is usually quite accurate but there are transcription errors that can and very often do occur. I apologize for any typographical errors that were not detected and corrected.      Jomarie Longs, MD 05/03/2021, 3:12 PM

## 2021-05-05 DIAGNOSIS — I10 Essential (primary) hypertension: Secondary | ICD-10-CM | POA: Diagnosis not present

## 2021-05-05 DIAGNOSIS — E782 Mixed hyperlipidemia: Secondary | ICD-10-CM | POA: Diagnosis not present

## 2021-05-05 DIAGNOSIS — E039 Hypothyroidism, unspecified: Secondary | ICD-10-CM | POA: Diagnosis not present

## 2021-05-05 DIAGNOSIS — R69 Illness, unspecified: Secondary | ICD-10-CM | POA: Diagnosis not present

## 2021-05-11 DIAGNOSIS — G4733 Obstructive sleep apnea (adult) (pediatric): Secondary | ICD-10-CM | POA: Diagnosis not present

## 2021-05-12 ENCOUNTER — Ambulatory Visit (INDEPENDENT_AMBULATORY_CARE_PROVIDER_SITE_OTHER): Payer: Medicare HMO | Admitting: Licensed Clinical Social Worker

## 2021-05-12 ENCOUNTER — Other Ambulatory Visit: Payer: Self-pay

## 2021-05-12 DIAGNOSIS — R69 Illness, unspecified: Secondary | ICD-10-CM | POA: Diagnosis not present

## 2021-05-12 DIAGNOSIS — F411 Generalized anxiety disorder: Secondary | ICD-10-CM | POA: Diagnosis not present

## 2021-05-12 DIAGNOSIS — F330 Major depressive disorder, recurrent, mild: Secondary | ICD-10-CM | POA: Diagnosis not present
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
## 2021-05-19 DIAGNOSIS — I10 Essential (primary) hypertension: Secondary | ICD-10-CM | POA: Diagnosis not present

## 2021-05-19 DIAGNOSIS — R69 Illness, unspecified: Secondary | ICD-10-CM | POA: Diagnosis not present

## 2021-05-19 DIAGNOSIS — E782 Mixed hyperlipidemia: Secondary | ICD-10-CM | POA: Diagnosis not present

## 2021-05-19 DIAGNOSIS — E039 Hypothyroidism, unspecified: Secondary | ICD-10-CM | POA: Diagnosis not present

## 2021-06-09 DIAGNOSIS — E782 Mixed hyperlipidemia: Secondary | ICD-10-CM | POA: Diagnosis not present

## 2021-06-09 DIAGNOSIS — E039 Hypothyroidism, unspecified: Secondary | ICD-10-CM | POA: Diagnosis not present

## 2021-06-09 DIAGNOSIS — E668 Other obesity: Secondary | ICD-10-CM | POA: Diagnosis not present

## 2021-06-09 DIAGNOSIS — I10 Essential (primary) hypertension: Secondary | ICD-10-CM | POA: Diagnosis not present

## 2021-06-09 DIAGNOSIS — R69 Illness, unspecified: Secondary | ICD-10-CM | POA: Diagnosis not present

## 2021-06-09 NOTE — Telephone Encounter (Signed)
error 

## 2021-06-15 ENCOUNTER — Encounter: Payer: Self-pay | Admitting: Psychiatry

## 2021-06-15 ENCOUNTER — Ambulatory Visit (INDEPENDENT_AMBULATORY_CARE_PROVIDER_SITE_OTHER): Payer: Medicare HMO | Admitting: Psychiatry

## 2021-06-15 VITALS — BP 120/79 | HR 84 | Temp 97.6°F | Wt 198.6 lb

## 2021-06-15 DIAGNOSIS — F330 Major depressive disorder, recurrent, mild: Secondary | ICD-10-CM | POA: Diagnosis not present

## 2021-06-15 DIAGNOSIS — F411 Generalized anxiety disorder: Secondary | ICD-10-CM

## 2021-06-15 DIAGNOSIS — Z789 Other specified health status: Secondary | ICD-10-CM

## 2021-06-15 DIAGNOSIS — F1290 Cannabis use, unspecified, uncomplicated: Secondary | ICD-10-CM | POA: Diagnosis not present

## 2021-06-15 DIAGNOSIS — R69 Illness, unspecified: Secondary | ICD-10-CM | POA: Diagnosis not present

## 2021-06-15 MED ORDER — BUPROPION HCL 75 MG PO TABS: 75.0000 mg | ORAL_TABLET | Freq: Every day | ORAL | 1 refills | Status: DC

## 2021-06-15 MED ORDER — DULOXETINE HCL 30 MG PO CPEP: 30.0000 mg | ORAL_CAPSULE | Freq: Every day | ORAL | 1 refills | Status: DC

## 2021-06-15 NOTE — Patient Instructions (Signed)
Bupropion Tablets (Depression/Mood Disorders) ?What is this medication? ?BUPROPION (byoo PROE pee on) treats depression. It increases norepinephrine and dopamine in the brain, hormones that help regulate mood. It belongs to a group of medications called NDRIs. ?This medicine may be used for other purposes; ask your health care provider or pharmacist if you have questions. ?COMMON BRAND NAME(S): Wellbutrin ?What should I tell my care team before I take this medication? ?They need to know if you have any of these conditions: ?An eating disorder, such as anorexia or bulimia ?Bipolar disorder or psychosis ?Diabetes or high blood sugar, treated with medication ?Glaucoma ?Heart disease, previous heart attack, or irregular heart beat ?Head injury or brain tumor ?High blood pressure ?Kidney or liver disease ?Seizures ?Suicidal thoughts or a previous suicide attempt ?Tourette's syndrome ?Weight loss ?An unusual or allergic reaction to bupropion, other medications, foods, dyes, or preservatives ?Pregnant or trying to become pregnant ?Breast-feeding ?How should I use this medication? ?Take this medication by mouth with a glass of water. Follow the directions on the prescription label. You can take it with or without food. If it upsets your stomach, take it with food. Take your medication at regular intervals. Do not take your medication more often than directed. Do not stop taking this medication suddenly except upon the advice of your care team. Stopping this medication too quickly may cause serious side effects or your condition may worsen. ?A special MedGuide will be given to you by the pharmacist with each prescription and refill. Be sure to read this information carefully each time. ?Talk to your care team regarding the use of this medication in children. Special care may be needed. ?Overdosage: If you think you have taken too much of this medicine contact a poison control center or emergency room at once. ?NOTE: This  medicine is only for you. Do not share this medicine with others. ?What if I miss a dose? ?If you miss a dose, take it as soon as you can. If it is less than four hours to your next dose, take only that dose and skip the missed dose. Do not take double or extra doses. ?What may interact with this medication? ?Do not take this medication with any of the following: ?Linezolid ?MAOIs like Azilect, Carbex, Eldepryl, Marplan, Nardil, and Parnate ?Methylene blue (injected into a vein) ?Other medications that contain bupropion like Zyban ?This medication may also interact with the following: ?Alcohol ?Certain medications for anxiety or sleep ?Certain medications for blood pressure like metoprolol, propranolol ?Certain medications for depression or psychotic disturbances ?Certain medications for HIV or AIDS like efavirenz, lopinavir, nelfinavir, ritonavir ?Certain medications for irregular heart beat like propafenone, flecainide ?Certain medications for Parkinson's disease like amantadine, levodopa ?Certain medications for seizures like carbamazepine, phenytoin, phenobarbital ?Cimetidine ?Clopidogrel ?Cyclophosphamide ?Digoxin ?Furazolidone ?Isoniazid ?Nicotine ?Orphenadrine ?Procarbazine ?Steroid medications like prednisone or cortisone ?Stimulant medications for attention disorders, weight loss, or to stay awake ?Tamoxifen ?Theophylline ?Thiotepa ?Ticlopidine ?Tramadol ?Warfarin ?This list may not describe all possible interactions. Give your health care provider a list of all the medicines, herbs, non-prescription drugs, or dietary supplements you use. Also tell them if you smoke, drink alcohol, or use illegal drugs. Some items may interact with your medicine. ?What should I watch for while using this medication? ?Tell your care team if your symptoms do not get better or if they get worse. Visit your care team for regular checks on your progress. Because it may take several weeks to see the full effects of this  medication,  it is important to continue your treatment as prescribed. ?Watch for new or worsening thoughts of suicide or depression. This includes sudden changes in mood, behavior, or thoughts. These changes can happen at any time but are more common in the beginning of treatment or after a change in dose. Call your care team right away if you experience these thoughts or worsening depression. ?Manic episodes may happen in patients with bipolar disorder who take this medication. Watch for changes in feelings or behaviors such as feeling anxious, nervous, agitated, panicky, irritable, hostile, aggressive, impulsive, severely restless, overly excited and hyperactive, or trouble sleeping. These symptoms can happen at anytime but are more common in the beginning of treatment or after a change in dose. Call your care team right away if you notice any of these symptoms. ?This medication may cause serious skin reactions. They can happen weeks to months after starting the medication. Contact your care team right away if you notice fevers or flu-like symptoms with a rash. The rash may be red or purple and then turn into blisters or peeling of the skin. Or, you might notice a red rash with swelling of the face, lips or lymph nodes in your neck or under your arms. ?Avoid drinks that contain alcohol while taking this medication. Drinking large amounts of alcohol, using sleeping or anxiety medications, or quickly stopping the use of these agents while taking this medication may increase your risk for a seizure. ?Do not drive or use heavy machinery until you know how this medication affects you. This medication can impair your ability to perform these tasks. ?Do not take this medication close to bedtime. It may prevent you from sleeping. ?Your mouth may get dry. Chewing sugarless gum or sucking hard candy, and drinking plenty of water may help. Contact your care team if the problem does not go away or is severe. ?What side  effects may I notice from receiving this medication? ?Side effects that you should report to your care team as soon as possible: ?Allergic reactions--skin rash, itching, hives, swelling of the face, lips, tongue, or throat ?Increase in blood pressure ?Mood and behavior changes--anxiety, nervousness, confusion, hallucinations, irritability, hostility, thoughts of suicide or self-harm, worsening mood, feelings of depression ?Redness, blistering, peeling, or loosening of the skin, including inside the mouth ?Seizures ?Sudden eye pain or change in vision such as blurry vision, seeing halos around lights, vision loss ?Side effects that usually do not require medical attention (report to your care team if they continue or are bothersome): ?Constipation ?Dizziness ?Dry mouth ?Loss of appetite ?Nausea ?Tremors or shaking ?Trouble sleeping ?This list may not describe all possible side effects. Call your doctor for medical advice about side effects. You may report side effects to FDA at 1-800-FDA-1088. ?Where should I keep my medication? ?Keep out of the reach of children and pets. ?Store at room temperature between 20 and 25 degrees C (68 and 77 degrees F), away from direct sunlight and moisture. Keep tightly closed. Throw away any unused medication after the expiration date. ?NOTE: This sheet is a summary. It may not cover all possible information. If you have questions about this medicine, talk to your doctor, pharmacist, or health care provider. ?? 2023 Elsevier/Gold Standard (2020-04-21 00:00:00) ? ?

## 2021-06-15 NOTE — Progress Notes (Signed)
[facility] Progress Note ? ?06/15/2021 2:37 PM ?Shannon Crawford  ?MRN:  [id number] ? ?Chief Complaint:  ?Chief Complaint  ?Patient presents with  ? Follow-up: 66 year old female, history of GAD, MDD, presents with worsening concentration problems depressive symptoms as well as possible side effects to gabapentin.  ? ?HPI: Shannon Crawford is a 66 year old Caucasian female who has a history of GAD, MDD, significant use of alcohol, use of cannabis, hypertension, chronic pain syndrome, hypothyroidism, hyperlipidemia was evaluated in office today. ? ?Patient today reports she continues to struggle with lack of motivation, and believes her concentration has been getting worse.  She reports she feels all over the place and is unable to do projects around the house.  Patient reports she also struggles with short-term memory problems.  She has to think over sometimes and it comes back to her however this has been going on since the past few weeks.  She is currently on a higher dosage of gabapentin and wonders whether the gabapentin could be making it worse. ? ?Patient reports sleep is overall okay. ? ?Denies current suicidality, homicidality or perceptual disturbances. ? ?Patient is currently in psychotherapy sessions, is motivated to stay in therapy. ? ?Interested in trial of Wellbutrin and would like to come off of the Cymbalta if possible. ? ?Denies any other concerns today. ? ?Visit Diagnosis:  ?  ICD-10-CM   ?1. GAD (generalized anxiety disorder)  F41.1 DULoxetine (CYMBALTA) 30 MG capsule  ?  ?2. MDD (major depressive disorder), recurrent episode, mild (HCC)  F33.0 buPROPion (WELLBUTRIN) 75 MG tablet  ?  DULoxetine (CYMBALTA) 30 MG capsule  ?  ?3. Significant use of alcohol  Z78.9   ?  ?4. Use of cannabis  F12.90   ?  ? ? ?Past Psychiatric History: Reviewed past psychiatric history from progress note on 03/29/2021.  Past trials of medications like Lexapro, Zoloft, duloxetine, zolpidem, Rexulti, Xanax, Topamax. ? ?Past Medical  History:  ?Past Medical History:  ?Diagnosis Date  ? Anxiety   ? Fracture   ? ankle  ? Heart murmur   ? Hypothyroid   ? Thyroid disease   ?  ?Past Surgical History:  ?Procedure Laterality Date  ? ABDOMINAL SURGERY    ? tummy tuck  ? BREAST BIOPSY Left 10+ years  ? Negative- core bx  ? ? ?Family Psychiatric History: Reviewed family psychiatric history from progress note on 03/29/2021. ? ?Family History:  ?Family History  ?Problem Relation Age of Onset  ? Hypertension Mother   ? Alcohol abuse Father   ? Anxiety disorder Sister   ? Prostate cancer Brother   ? Hypertension Sister   ? Breast cancer Sister 15  ? Breast cancer Cousin   ? ? ?Social History: Reviewed social history from progress note on 03/29/2021. ?Social History  ? ?Socioeconomic History  ? Marital status: Married  ?  Spouse name: Shannon Crawford  ? Number of children: 2  ? Years of education: Not on file  ? Highest education level: Some college, no degree  ?Occupational History  ? Not on file  ?Tobacco Use  ? Smoking status: Never  ? Smokeless tobacco: Never  ?Vaping Use  ? Vaping Use: Never used  ?Substance and Sexual Activity  ? Alcohol use: Yes  ?  Alcohol/week: 12.0 standard drinks  ?  Types: 12 Cans of beer per week  ? Drug use: No  ? Sexual activity: Yes  ?Other Topics Concern  ? Not on file  ?Social History Narrative  ? Not  on file  ? ?Social Determinants of Health  ? ?Financial Resource Strain: Not on file  ?Food Insecurity: Not on file  ?Transportation Needs: Not on file  ?Physical Activity: Not on file  ?Stress: Not on file  ?Social Connections: Not on file  ? ? ?Allergies:  ?Allergies  ?Allergen Reactions  ? Catfish [Fish Allergy] Nausea And Vomiting  ? ? ?Metabolic Disorder Labs: ?No results found for: HGBA1C, MPG ?No results found for: PROLACTIN ?No results found for: CHOL, TRIG, HDL, CHOLHDL, VLDL, LDLCALC ?No results found for: TSH ? ?Therapeutic Level Labs: ?No results found for: LITHIUM ?No results found for: VALPROATE ?No components found for:   CBMZ ? ?Current Medications: ?Current Outpatient Medications  ?Medication Sig Dispense Refill  ? B Complex-C (SUPER B COMPLEX PO) Take by mouth.    ? buPROPion (WELLBUTRIN) 75 MG tablet Take 1 tablet (75 mg total) by mouth daily with breakfast. 30 tablet 1  ? Cholecalciferol (D-3-5) 125 MCG (5000 UT) capsule Take 5,000 Units by mouth daily.    ? DULoxetine (CYMBALTA) 30 MG capsule Take 1 capsule (30 mg total) by mouth daily. 30 capsule 1  ? gabapentin (NEURONTIN) 100 MG capsule Take 1 capsule (100 mg total) by mouth 3 (three) times daily. 270 capsule 0  ? levothyroxine (SYNTHROID) 88 MCG tablet Take 88 mcg by mouth daily before breakfast.    ? losartan-hydrochlorothiazide (HYZAAR) 100-25 MG tablet Take 1 tablet by mouth daily.    ? Omega-3 Fatty Acids (FISH OIL) 1000 MG CAPS Take by mouth.    ? rosuvastatin (CRESTOR) 20 MG tablet 20 mg daily.    ? zolpidem (AMBIEN) 5 MG tablet Take 1 tablet (5 mg total) by mouth at bedtime as needed for sleep. 30 tablet 1  ? losartan (COZAAR) 50 MG tablet Take 50 mg by mouth daily. (Patient not taking: Reported on 06/15/2021)    ? ?No current facility-administered medications for this visit.  ? ? ? ?Musculoskeletal: ?Strength & Muscle Tone: within normal limits ?Gait & Station: normal ?Patient leans: N/A ? ?Psychiatric Specialty Exam: ?Review of Systems  ?Psychiatric/Behavioral:  Positive for decreased concentration and dysphoric mood. The patient is nervous/anxious.   ?All other systems reviewed and are negative.  ?Blood pressure 120/79, pulse 84, temperature 97.6 ?F (36.4 ?C), temperature source Temporal, weight 198 lb 9.6 oz (90.1 kg).Body mass index is 36.32 kg/m?.  ?General Appearance: Casual  ?Eye Contact:  Fair  ?Speech:  Clear and Coherent  ?Volume:  Normal  ?Mood:  Anxious and Depressed  ?Affect:  Congruent  ?Thought Process:  Goal Directed and Descriptions of Associations: Intact  ?Orientation:  Full (Time, Place, and Person)  ?Thought Content: Logical   ?Suicidal Thoughts:   No  ?Homicidal Thoughts:  No  ?Memory:  Immediate;   Fair ?Recent;   Fair ?Remote;   Fair report short-term memory loss  ?Judgement:  Fair  ?Insight:  Fair  ?Psychomotor Activity:  Normal  ?Concentration:  Concentration: Fair and Attention Span: Fair  ?Recall:  Fair  ?Fund of Knowledge: Fair  ?Language: Fair  ?Akathisia:  No  ?Handed:  Right  ?AIMS (if indicated): done  ?Assets:  Communication Skills ?Desire for Improvement ?Housing ?Social Support  ?ADL's:  Intact  ?Cognition: WNL  ?Sleep:  Fair  ? ?Screenings: ?AUDIT   ? ?[facility] Office Visit from 03/29/2021 in [facility]  ?Alcohol Use Disorder Identification Test Final Score (AUDIT) 13  ? ?  ? ?GAD-7   ? ?[facility] Office Visit from  06/15/2021 in [facility] Counselor from 05/12/2021 in [facility] Office Visit from 05/03/2021 in [facility] Office Visit from 03/29/2021 in [facility]  ?Total GAD-7 Score 8 8 0 5  ? ?  ? ?PHQ2-9   ? ?[facility] Office Visit from 06/15/2021 in [facility] Counselor from 05/12/2021 in [facility] Office Visit from 05/03/2021 in [facility] Office Visit from 03/29/2021 in [facility] Office Visit from 10/26/2020 in [facility]  ?PHQ-2 Total Score '4 3 2 2 3  '$ ?PHQ-9 Total Score '14 16 9 8 16  '$ ? ?  ? ?[facility] [place] Office Visit from 06/15/2021 in [facility] Counselor from 05/12/2021 in [facility]  ?C-SSRS RISK CATEGORY Low Risk Error: Q7 should not be populated when Q6 is No  ? ?  ? ? ? ?Assessment and Plan: Shannon Crawford is a 65 year old female, married, lives in [place], has a history of MDD, hypothyroidism, chronic pain syndrome, hypertension, hyperlipidemia was evaluated  in office today.  Patient is currently struggling with concentration, possible short-term memory loss likely side effect of her medications, will benefit from the following plan. ? ?Plan ?GAD-improving ?Reduc

## 2021-06-23 ENCOUNTER — Ambulatory Visit (INDEPENDENT_AMBULATORY_CARE_PROVIDER_SITE_OTHER): Payer: Medicare HMO | Admitting: Licensed Clinical Social Worker

## 2021-06-23 DIAGNOSIS — F330 Major depressive disorder, recurrent, mild: Secondary | ICD-10-CM

## 2021-06-23 DIAGNOSIS — F411 Generalized anxiety disorder: Secondary | ICD-10-CM

## 2021-06-23 DIAGNOSIS — R69 Illness, unspecified: Secondary | ICD-10-CM | POA: Diagnosis not present

## 2021-06-24 NOTE — Progress Notes (Signed)
? ?  THERAPIST PROGRESS NOTE ? ?Session Time: 1-2p ? ?[facility] in office visit for patient and LCSW clinician ? ?Participation Level: Active ? ?Behavioral Response: NeatAlertAnxious ? ?Type of Therapy: Individual Therapy ? ?Treatment Goals addressed: Problem: Depression ?Goal: LTG: Decrease depressive symptoms and improve levels of effective functioning-pt reports a decrease in overall depression symptoms 3 out of 5 sessions documented.  ?Outcome: Progressing ?Goal: STG: Develop healthy thinking patterns and beliefs about self, others, and the world that lead to the alleviation and help prevent the relapse of depression. 3 out of 5 sessions documented ?Outcome: Progressing ?  ?Problem: Anxiety Disorder ?Goal: LTG: Reduce overall frequency, intensity, and duration of the anxiety so that daily functioning is not impaired per pt self report 3 out of 5 sessions documented.   ?Outcome: Progressing ?Goal: STG: Learn and implement coping skills that result in a reduction of anxiety and worry, and improve daily functioning. 3 out of 5 sessions documented ?Outcome: Progressing ?  ? ?ProgressTowards Goals: Progressing ? ?Interventions: CBT and Other: trauma focused ? ?Summary: Shannon Crawford is a 66 y.o. female who presents with improving symptoms related to depression and anxiety diagnoses.  ? ?Allowed pt to explore and express thoughts and feelings associated with recent life situations and external stressors. Allowed patient to explore current marital situation. Patient reports that she feels that her husband is not emotionally connected to her, so patient has sought out emotional connection with others in the past. Patient reports that her husband is aware of affairs that she has had in the past, and they made the decision to work on their marriage. Explored patient's thoughts and feelings at the time she was having the affairs. Explored patients relationship with her sisters--patient is estranged from two sisters, has one  sister in a group home, and she has good relationship with one sister and one brother. Allow patient to explore relationships with her siblings throughout lifespan. ? ?Patient reports that she is looking forward to an upcoming quilt retreat--this gives her an opportunity to have a creative outlet in addition to engaging socially with individuals that share her passion for quilting. ? ?Continued recommendations are as follows: self care behaviors, positive social engagements, focusing on overall work/home/life balance, and focusing on positive physical and emotional wellness.  ? ? ?Suicidal/Homicidal: No ? ?Therapist Response: Pt is continuing to apply interventions learned in session into daily life situations. Pt is currently on track to meet goals utilizing interventions mentioned above. Personal growth and progress noted. Treatment to continue as indicated.  ? ?Plan: Return again in 4 weeks. ? ?Diagnosis: MDD (major depressive disorder), recurrent episode, mild ([facility]) ? ?GAD (generalized anxiety disorder) ? ?Collaboration of Care: Other Pt encouraged to continue care with psychiatrist of record, Dr. Ursula Alert ? ?Patient/Guardian was advised Release of Information must be obtained prior to any record release in order to collaborate their care with an outside provider. Patient/Guardian was advised if they have not already done so to contact the registration department to sign all necessary forms in order for [place] to release information regarding their care.  ? ?Consent: Patient/Guardian gives verbal consent for treatment and assignment of benefits for services provided during this visit. Patient/Guardian expressed understanding and agreed to proceed.  ? ?Granville Lewis, LCSW ?06/24/2021 ? ?

## 2021-06-24 NOTE — Plan of Care (Signed)
?  Problem: Depression ?Goal: LTG: Decrease depressive symptoms and improve levels of effective functioning-pt reports a decrease in overall depression symptoms 3 out of 5 sessions documented.  ?Outcome: Progressing ?Goal: STG: Develop healthy thinking patterns and beliefs about self, others, and the world that lead to the alleviation and help prevent the relapse of depression. 3 out of 5 sessions documented ?Outcome: Progressing ?  ?Problem: Anxiety Disorder ?Goal: LTG: Reduce overall frequency, intensity, and duration of the anxiety so that daily functioning is not impaired per pt self report 3 out of 5 sessions documented.   ?Outcome: Progressing ?Goal: STG: Learn and implement coping skills that result in a reduction of anxiety and worry, and improve daily functioning. 3 out of 5 sessions documented ?Outcome: Progressing ?  ?

## 2021-07-14 ENCOUNTER — Telehealth: Payer: Self-pay

## 2021-07-14 ENCOUNTER — Encounter: Payer: Self-pay | Admitting: Psychiatry

## 2021-07-14 ENCOUNTER — Ambulatory Visit (INDEPENDENT_AMBULATORY_CARE_PROVIDER_SITE_OTHER): Payer: Medicare HMO | Admitting: Psychiatry

## 2021-07-14 VITALS — BP 127/81 | HR 72 | Temp 97.9°F | Wt 200.6 lb

## 2021-07-14 DIAGNOSIS — F411 Generalized anxiety disorder: Secondary | ICD-10-CM

## 2021-07-14 DIAGNOSIS — F3341 Major depressive disorder, recurrent, in partial remission: Secondary | ICD-10-CM

## 2021-07-14 DIAGNOSIS — F1290 Cannabis use, unspecified, uncomplicated: Secondary | ICD-10-CM

## 2021-07-14 DIAGNOSIS — Z789 Other specified health status: Secondary | ICD-10-CM

## 2021-07-14 DIAGNOSIS — R69 Illness, unspecified: Secondary | ICD-10-CM | POA: Diagnosis not present

## 2021-07-14 MED ORDER — BUPROPION HCL 75 MG PO TABS: 75.0000 mg | ORAL_TABLET | Freq: Every day | ORAL | 0 refills | Status: DC

## 2021-07-14 MED ORDER — DULOXETINE HCL 30 MG PO CPEP: 30.0000 mg | ORAL_CAPSULE | Freq: Every day | ORAL | 0 refills | Status: DC

## 2021-07-14 NOTE — Telephone Encounter (Signed)
Pt called and left VM asking to schedule appt. Called pt back at number provided. No Answer. VM left asking her to call back and schedule.

## 2021-07-14 NOTE — Progress Notes (Signed)
[place] MD OP Progress Note  07/14/2021 9:29 PM Shannon Crawford  MRN:  [id number]  Chief Complaint:  Chief Complaint  Patient presents with   Follow-up: 66 year old female who has a history of GAD, MDD, presented for medication management.   HPI: Shannon Crawford is a 66 year old Caucasian female who has a history of GAD, MDD, significant use of alcohol, use of cannabis, hypertension, chronic pain syndrome, hypothyroidism, hyperlipidemia was evaluated in office today.  Patient today reports she has noticed improvement in her mood since being on the Wellbutrin.  She feels more motivated to do certain things, work on her [place] projects.  She has not had any significant side effects at this time.  Continues to be on the Cymbalta low dosage.  Patient reports sleep has improved.  She continues to use the CPAP.  Sleeps around 7 hours.  Continues to be following up with her therapist.  Reports therapy sessions as helpful.  Patient denies any suicidality, homicidality or perceptual disturbances.  Patient denies any other concerns today.  Visit Diagnosis:    ICD-10-CM   1. GAD (generalized anxiety disorder)  F41.1 DULoxetine (CYMBALTA) 30 MG capsule    2. MDD (major depressive disorder), recurrent, in partial remission (HCC)  F33.41 buPROPion (WELLBUTRIN) 75 MG tablet    DULoxetine (CYMBALTA) 30 MG capsule    3. Significant use of alcohol  Z78.9     4. Use of cannabis  F12.90       Past Psychiatric History: Reviewed past psychiatric history from progress note on 03/29/2021.  Past trials of medications like Lexapro, Zoloft, duloxetine, zolpidem, Rexulti, Xanax, Topamax.  Past Medical History:  Past Medical History:  Diagnosis Date   Anxiety    Fracture    ankle   Heart murmur    Hypothyroid    Thyroid disease     Past Surgical History:  Procedure Laterality Date   ABDOMINAL SURGERY     tummy tuck   BREAST BIOPSY Left 10+ years   Negative- core bx    Family Psychiatric History:  Reviewed family psychiatric history from progress note on 03/29/2021.  Family History:  Family History  Problem Relation Age of Onset   Hypertension Mother    Alcohol abuse Father    Anxiety disorder Sister    Prostate cancer Brother    Hypertension Sister    Breast cancer Sister 22   Breast cancer Cousin     Social History: Reviewed social history from progress note on 03/29/2021. Social History   Socioeconomic History   Marital status: Married    Spouse name: Shannon Crawford   Number of children: 2   Years of education: Not on file   Highest education level: Some college, no degree  Occupational History   Not on file  Tobacco Use   Smoking status: Never   Smokeless tobacco: Never  Vaping Use   Vaping Use: Never used  Substance and Sexual Activity   Alcohol use: Yes    Alcohol/week: 12.0 standard drinks    Types: 12 Cans of beer per week   Drug use: No   Sexual activity: Yes  Other Topics Concern   Not on file  Social History Narrative   Not on file   Social Determinants of Health   Financial Resource Strain: Not on file  Food Insecurity: Not on file  Transportation Needs: Not on file  Physical Activity: Not on file  Stress: Not on file  Social Connections: Not on file    Allergies:  Allergies  Allergen Reactions   Catfish [Fish Allergy] Nausea And Vomiting    Metabolic Disorder Labs: No results found for: HGBA1C, MPG No results found for: PROLACTIN No results found for: CHOL, TRIG, HDL, CHOLHDL, VLDL, LDLCALC No results found for: TSH  Therapeutic Level Labs: No results found for: LITHIUM No results found for: VALPROATE No components found for:  CBMZ  Current Medications: Current Outpatient Medications  Medication Sig Dispense Refill   B Complex-C (SUPER B COMPLEX PO) Take by mouth.     Cholecalciferol (D-3-5) 125 MCG (5000 UT) capsule Take 5,000 Units by mouth daily.     gabapentin (NEURONTIN) 100 MG capsule Take 1 capsule (100 mg total) by mouth 3 (three)  times daily. 270 capsule 0   levothyroxine (SYNTHROID) 88 MCG tablet Take 88 mcg by mouth daily before breakfast.     losartan (COZAAR) 50 MG tablet Take 50 mg by mouth daily.     losartan-hydrochlorothiazide (HYZAAR) 100-25 MG tablet Take 1 tablet by mouth daily.     Omega-3 Fatty Acids (FISH OIL) 1000 MG CAPS Take by mouth.     rosuvastatin (CRESTOR) 20 MG tablet 20 mg daily.     zolpidem (AMBIEN) 5 MG tablet Take 1 tablet (5 mg total) by mouth at bedtime as needed for sleep. 30 tablet 1   buPROPion (WELLBUTRIN) 75 MG tablet Take 1 tablet (75 mg total) by mouth daily with breakfast. 90 tablet 0   DULoxetine (CYMBALTA) 30 MG capsule Take 1 capsule (30 mg total) by mouth daily. 90 capsule 0   No current facility-administered medications for this visit.     Musculoskeletal: Strength & Muscle Tone: within normal limits Gait & Station: normal Patient leans: N/A  Psychiatric Specialty Exam: Review of Systems  Psychiatric/Behavioral:  Positive for dysphoric mood. The patient is nervous/anxious.   All other systems reviewed and are negative.  Blood pressure 127/81, pulse 72, temperature 97.9 F (36.6 C), temperature source Temporal, weight 200 lb 9.6 oz (91 kg).Body mass index is 36.69 kg/m.  General Appearance: Casual  Eye Contact:  Fair  Speech:  Clear and Coherent  Volume:  Normal  Mood:  Anxious and Depressed improving  Affect:  Appropriate  Thought Process:  Goal Directed and Descriptions of Associations: Intact  Orientation:  Full (Time, Place, and Person)  Thought Content: Logical   Suicidal Thoughts:  No  Homicidal Thoughts:  No  Memory:  Immediate;   Fair Recent;   Fair Remote;   Fair  Judgement:  Fair  Insight:  Fair  Psychomotor Activity:  Normal  Concentration:  Concentration: Fair and Attention Span: Fair  Recall:  [employer] of Knowledge: Fair  Language: Fair  Akathisia:  No  Handed:  Right  AIMS (if indicated): done  Assets:  Communication Skills Desire for  Improvement Housing Intimacy Social Support  ADL's:  Intact  Cognition: WNL  Sleep:  Fair   Screenings: [place] Office Visit from 07/14/2021 in [place] Total Score 0      AUDIT    [place] Office Visit from 03/29/2021 in [place]  Alcohol Use Disorder Identification Test Final Score (AUDIT) 13      GAD-7    [place] Office Visit from 06/15/2021 in [place] Counselor from 05/12/2021 in [place] Office Visit from 05/03/2021 in [place] Office Visit from 03/29/2021 in [place]  Total GAD-7 Score  8 8 0 5      PHQ2-9    [place] Visit from 07/14/2021 in [place] Office Visit from 06/15/2021 in [place] Counselor from 05/12/2021 in [place] Office Visit from 05/03/2021 in [place] Office Visit from 03/29/2021 in [place]  PHQ-2 Total Score '2 4 3 2 2  '$ PHQ-9 Total Score '11 14 16 9 8      '$ [place] Office Visit from 07/14/2021 in [place] Office Visit from 06/15/2021 in [facility] Counselor from 05/12/2021 in [place] Error: Q3, 4, or 5 should not be populated when Q2 is No Low Risk Error: Q7 should not be populated when Q6 is No        Assessment and Plan: TEMISHA MURLEY is a 66 year old female, married, lives in [place], has a history of MDD, hypothyroidism, chronic pain syndrome, hypertension, hyperlipidemia was evaluated in office today.  Patient is currently improving on the Wellbutrin.  Plan as noted below.  Plan GAD-improving Cymbalta 30 mg p.o. daily.  Plan to taper it off  gradually. Wellbutrin 75 mg p.o. daily in the morning Gabapentin 100 mg p.o. 3 times daily. Continue CBT.  MDD in partial remission Wellbutrin 75 mg p.o. daily Cymbalta 30 mg p.o. daily Continue zolpidem 5 mg p.o. nightly as needed Continue CBT  Significant use of alcohol-improving. She is cutting back.  Use of cannabis-improving Currently cutting back.  Follow-up in clinic in 2 months or sooner if needed.  This note was generated in part or whole with voice recognition software. Voice recognition is usually quite accurate but there are transcription errors that can and very often do occur. I apologize for any typographical errors that were not detected and corrected.       Ursula Alert, MD 07/14/2021, 9:29 PM

## 2021-08-09 DIAGNOSIS — I10 Essential (primary) hypertension: Secondary | ICD-10-CM | POA: Diagnosis not present

## 2021-08-09 DIAGNOSIS — E782 Mixed hyperlipidemia: Secondary | ICD-10-CM | POA: Diagnosis not present

## 2021-08-09 DIAGNOSIS — E039 Hypothyroidism, unspecified: Secondary | ICD-10-CM | POA: Diagnosis not present

## 2021-08-09 DIAGNOSIS — R69 Illness, unspecified: Secondary | ICD-10-CM | POA: Diagnosis not present

## 2021-08-11 ENCOUNTER — Other Ambulatory Visit: Payer: Self-pay | Admitting: Psychiatry

## 2021-08-11 DIAGNOSIS — F3341 Major depressive disorder, recurrent, in partial remission: Secondary | ICD-10-CM

## 2021-08-17 ENCOUNTER — Ambulatory Visit (INDEPENDENT_AMBULATORY_CARE_PROVIDER_SITE_OTHER): Payer: Medicare HMO | Admitting: Licensed Clinical Social Worker

## 2021-08-17 DIAGNOSIS — F330 Major depressive disorder, recurrent, mild: Secondary | ICD-10-CM | POA: Diagnosis not present

## 2021-08-17 DIAGNOSIS — F411 Generalized anxiety disorder: Secondary | ICD-10-CM | POA: Diagnosis not present

## 2021-08-17 DIAGNOSIS — R69 Illness, unspecified: Secondary | ICD-10-CM | POA: Diagnosis not present

## 2021-08-17 NOTE — Progress Notes (Signed)
THERAPIST PROGRESS NOTE  Session Time: [place] in office visit for patient and LCSW clinician  Participation Level: Active  Behavioral Response: NeatAlertAnxious  Type of Therapy: Individual Therapy  Treatment Goals addressed: Problem: Depression Goal: LTG: Decrease depressive symptoms and improve levels of effective functioning-pt reports a decrease in overall depression symptoms 3 out of 5 sessions documented.  Outcome: Progressing Goal: STG: Develop healthy thinking patterns and beliefs about self, others, and the world that lead to the alleviation and help prevent the relapse of depression. 3 out of 5 sessions documented Outcome: Progressing   Problem: Anxiety Disorder Goal: LTG: Reduce overall frequency, intensity, and duration of the anxiety so that daily functioning is not impaired per pt self report 3 out of 5 sessions documented.   Outcome: Progressing Goal: STG: Learn and implement coping skills that result in a reduction of anxiety and worry, and improve daily functioning. 3 out of 5 sessions documented Outcome: Progressing    ProgressTowards Goals: Progressing  Interventions: CBT and Other: trauma focused  Summary: Shannon Crawford is a 66 y.o. female who presents with improving symptoms related to depression and anxiety diagnoses.   Allowed pt to explore and express thoughts and feelings associated with recent life situations and external stressors. Patient reports that she is continuing to find joy in her quilting. Patient reports that this is a good social outlook for her and the stress reliever for her. Patient reports that she has decreased amount of depression symptoms compared to previous sessions. Patient reports that she feels that she has more focus and concentration, so she has been reading more.  Discussed patients drinking behaviors and patient reports that she has been drinking less than she has drank in the past.  Patient does report that when she does drink she frequently will have four to six drinks. Discussed patients drinking goals, and drinking history. Patient denies any side effects or withdrawal when she stops drinking.  Discussed patients relationships with siblings--patient reports that she recently reconnected with an estranged sister, and patient feels very good about this.  Patient reports that she is continuing to work on her overall self-image and self esteem. Patient reports that she connected with a vitiligo support group, and is enjoying the connection.   patient is looking forward to an upcoming trip to [place] in July. Reviewed overall coping skills that patient is using to manage depression and anxiety symptoms, and activities that patient is engaging in that help manage stress. Patient reflects understanding and is cooperative.  Continued recommendations are as follows: self care behaviors, positive social engagements, focusing on overall work/home/life balance, and focusing on positive physical and emotional wellness.    Suicidal/Homicidal: No  Therapist Response: Pt is continuing to apply interventions learned in session into daily life situations. Pt is currently on track to meet goals utilizing interventions mentioned above. Personal growth and progress noted. Treatment to continue as indicated.   Plan: Return again in 4 weeks.  Diagnosis: MDD (major depressive disorder), recurrent episode, mild (HCC)  GAD (generalized anxiety disorder)  Collaboration of Care: Other Pt encouraged to continue care with psychiatrist of record, Dr. Ursula Alert  Patient/Guardian was advised Release of Information must be obtained prior to any record release in order to collaborate their care with an outside provider. Patient/Guardian was advised if they have not already done so to contact the registration department to sign all necessary forms in order for [place] to release information regarding  their care.  Consent: Patient/Guardian gives verbal consent for treatment and assignment of benefits for services provided during this visit. Patient/Guardian expressed understanding and agreed to proceed.   [place], LCSW 08/17/2021

## 2021-09-07 ENCOUNTER — Ambulatory Visit (INDEPENDENT_AMBULATORY_CARE_PROVIDER_SITE_OTHER): Payer: Medicare HMO | Admitting: Licensed Clinical Social Worker

## 2021-09-07 DIAGNOSIS — R69 Illness, unspecified: Secondary | ICD-10-CM | POA: Diagnosis not present

## 2021-09-07 DIAGNOSIS — F411 Generalized anxiety disorder: Secondary | ICD-10-CM | POA: Diagnosis not present

## 2021-09-07 DIAGNOSIS — F330 Major depressive disorder, recurrent, mild: Secondary | ICD-10-CM

## 2021-09-07 NOTE — Progress Notes (Signed)
   THERAPIST PROGRESS NOTE  Session Time: [place] in office visit for patient and LCSW clinician  Participation Level: Active  Behavioral Response: NeatAlertAnxious  Type of Therapy: Individual Therapy  Treatment Goals addressed:   ProgressTowards Goals: Progressing  Interventions: CBT and Other: trauma focused  Summary: Shannon Crawford is a 66 y.o. female who presents with improving symptoms related to depression and anxiety diagnoses.   Allowed pt to explore and express thoughts and feelings associated with recent life situations and external stressors. Pt reports that she had a trip to visit family in [place], and had a great trip. Pt had a goal of finishing a quilt for a family member, and pt achieved the goal of having the quilt finished prior to going on the trip.   Pt reports that relationships with family members are very good--pt enjoyed spending time with her sisters  Discussed patients relationships with siblings--patient reports that she recently reconnected with an estranged sister, and patient feels very good about this.  Allowed pt to identify personal strengths and identify progress path. Pt feels good about current progress.   Reviewed coping skills for managing stress/anxiety/depression.  Continued recommendations are as follows: self care behaviors, positive social engagements, focusing on overall work/home/life balance, and focusing on positive physical and emotional wellness.    Suicidal/Homicidal: No  Therapist Response: Pt is continuing to apply interventions learned in session into daily life situations. Pt is currently on track to meet goals utilizing interventions mentioned above. Personal growth and progress noted. Treatment to continue as indicated.   Plan: Return again in 4 weeks.  Diagnosis:  Encounter Diagnoses  Name Primary?   MDD (major depressive disorder), recurrent episode, mild (HCC) Yes   GAD  (generalized anxiety disorder)     Collaboration of Care: Other Pt encouraged to continue care with psychiatrist of record, Dr. Ursula Alert  Patient/Guardian was advised Release of Information must be obtained prior to any record release in order to collaborate their care with an outside provider. Patient/Guardian was advised if they have not already done so to contact the registration department to sign all necessary forms in order for [place] to release information regarding their care.   Consent: Patient/Guardian gives verbal consent for treatment and assignment of benefits for services provided during this visit. Patient/Guardian expressed understanding and agreed to proceed.   [place], LCSW 09/07/2021

## 2021-09-08 ENCOUNTER — Ambulatory Visit (INDEPENDENT_AMBULATORY_CARE_PROVIDER_SITE_OTHER): Payer: Medicare HMO | Admitting: Dermatology

## 2021-09-08 DIAGNOSIS — H02726 Madarosis of left eye, unspecified eyelid and periocular area: Secondary | ICD-10-CM

## 2021-09-08 DIAGNOSIS — L688 Other hypertrichosis: Secondary | ICD-10-CM

## 2021-09-08 DIAGNOSIS — L988 Other specified disorders of the skin and subcutaneous tissue: Secondary | ICD-10-CM

## 2021-09-08 DIAGNOSIS — L80 Vitiligo: Secondary | ICD-10-CM | POA: Diagnosis not present

## 2021-09-08 MED ORDER — BIMATOPROST 0.03 % EX SOLN: 1.0000 "application " | Freq: Every day | CUTANEOUS | 12 refills | Status: DC

## 2021-09-08 MED ORDER — OPZELURA 1.5 % EX CREA: 1.0000 | TOPICAL_CREAM | Freq: Every day | CUTANEOUS | 6 refills | Status: DC

## 2021-09-08 NOTE — Progress Notes (Signed)
Follow-Up Visit   Subjective  Shannon Crawford is a 66 y.o. female who presents for the following: Facial Elastosis (Botox today) and Other (Vitiligo - she would like to discuss Opzelura). Patient would like Latisse for eyelashes  The following portions of the chart were reviewed this encounter and updated as appropriate:   Tobacco  Allergies  Meds  Problems  Med Hx  Surg Hx  Fam Hx     Review of Systems:  No other skin or systemic complaints except as noted in HPI or Assessment and Plan.  Objective  Well appearing patient in no apparent distress; mood and affect are within normal limits.  A focused examination was performed including face, hands. Relevant physical exam findings are noted in the Assessment and Plan.                Face Rhytides and volume loss.      Left Upper Eyelid Hypotrichosis   Assessment & Plan  Elastosis of skin Face Botox today  55 units  Frown Complex 35 units Crow's Feet 10 units each side  Botox Injection - Face Location: See attached image  Informed consent: Discussed risks (infection, pain, bleeding, bruising, swelling, allergic reaction, paralysis of nearby muscles, eyelid droop, double vision, neck weakness, difficulty breathing, headache, undesirable cosmetic result, and need for additional treatment) and benefits of the procedure, as well as the alternatives.  Informed consent was obtained.  Preparation: The area was cleansed with alcohol.  Procedure Details:  Botox was injected into the dermis with a 30-gauge needle. Pressure applied to any bleeding. Ice packs offered for swelling.  Lot Number:  [id number] Expiration:  10/2023  Total Units Injected:  55 units  Plan: Patient was instructed to remain upright for 4 hours. Patient was instructed to avoid massaging the face and avoid vigorous exercise for the rest of the day. Tylenol may be used for headache.  Allow 2 weeks before returning to clinic for additional  dosing as needed. Patient will call for any problems.  Vitiligo Vitiligo is a chronic autoimmune condition which causes loss of skin pigment and is commonly seen on the face and may also involve areas of trauma like hands, elbows, knees, and ankles. There is no cure and it is difficult to treat.  Treatments include topical steroids and other topical anti-inflammatory ointments/creams and topical and oral Jak inhibitors.  Sometimes narrow band UV light therapy or Xtrac laser is helpful, both of which require twice weekly treatments for at least 3-6 months.  Antioxidant vitamins, such as Vitamins A,C,E,D, Folic Acid and B12 may be added to enhance treatment.  Opzelura cream qd - advised patient not likely covered by insurance but we will send it in and see if it can be approved through a PA  Patient asked about Joovv light - will research and discuss at follow up   Ruxolitinib Phosphate (OPZELURA) 1.5 % CREA Apply 1 Application topically daily.  Hypotrichosis eyelashes Eyelashes  Restart Latisse solution qhs  bimatoprost (LATISSE) 0.03 % ophthalmic solution - Left Upper Eyelid Place 1 application  into both eyes at bedtime. Place one drop on applicator and apply evenly along the skin of the upper eyelid at base of eyelashes once daily at bedtime; repeat procedure for second eye (use a clean applicator).  Return in about 4 months (around 01/09/2022).  I, Ashok Cordia, CMA, am acting as scribe for Sarina Ser, MD . Documentation: I have reviewed the above documentation for accuracy and completeness, and I  agree with the above.  Sarina Ser, MD

## 2021-09-08 NOTE — Patient Instructions (Signed)
Due to recent changes in healthcare laws, you may see results of your pathology and/or laboratory studies on MyChart before the doctors have had a chance to review them. We understand that in some cases there may be results that are confusing or concerning to you. Please understand that not all results are received at the same time and often the doctors may need to interpret multiple results in order to provide you with the best plan of care or course of treatment. Therefore, we ask that you please give us 2 business days to thoroughly review all your results before contacting the office for clarification. Should we see a critical lab result, you will be contacted sooner.   If You Need Anything After Your Visit  If you have any questions or concerns for your doctor, please call our main line at [phone] and press option 4 to reach your doctor's medical assistant. If no one answers, please leave a voicemail as directed and we will return your call as soon as possible. Messages left after 4 pm will be answered the following business day.   You may also send us a message via MyChart. We typically respond to MyChart messages within 1-2 business days.  For prescription refills, please ask your pharmacy to contact our office. Our fax number is [phone].  If you have an urgent issue when the clinic is closed that cannot wait until the next business day, you can page your doctor at the number below.    Please note that while we do our best to be available for urgent issues outside of office hours, we are not available 24/7.   If you have an urgent issue and are unable to reach us, you may choose to seek medical care at your doctor's office, retail clinic, urgent care center, or emergency room.  If you have a medical emergency, please immediately call 911 or go to the emergency department.  Pager Numbers  - Dr. Kowalski: [phone]  - Dr. Moye: [phone]  - Dr. Stewart:  [phone]  In the event of inclement weather, please call our main line at [phone] for an update on the status of any delays or closures.  Dermatology Medication Tips: Please keep the boxes that topical medications come in in order to help keep track of the instructions about where and how to use these. Pharmacies typically print the medication instructions only on the boxes and not directly on the medication tubes.   If your medication is too expensive, please contact our office at [phone] option 4 or send us a message through MyChart.   We are unable to tell what your co-pay for medications will be in advance as this is different depending on your insurance coverage. However, we may be able to find a substitute medication at lower cost or fill out paperwork to get insurance to cover a needed medication.   If a prior authorization is required to get your medication covered by your insurance company, please allow us 1-2 business days to complete this process.  Drug prices often vary depending on where the prescription is filled and some pharmacies may offer cheaper prices.  The website www.goodrx.com contains coupons for medications through different pharmacies. The prices here do not account for what the cost may be with help from insurance (it may be cheaper with your insurance), but the website can give you the price if you did not use any insurance.  - You can print the associated coupon and take it with   your prescription to the pharmacy.  - You may also stop by our office during regular business hours and pick up a GoodRx coupon card.  - If you need your prescription sent electronically to a different pharmacy, notify our office through [facility] MyChart or by phone at [phone] option 4.     Si Usted Necesita Algo Despus de Su Visita  Tambin puede enviarnos un mensaje a travs de MyChart. Por lo general respondemos a los mensajes de MyChart en el transcurso de 1 a 2  das hbiles.  Para renovar recetas, por favor pida a su farmacia que se ponga en contacto con nuestra oficina. Nuestro nmero de fax es el [phone].  Si tiene un asunto urgente cuando la clnica est cerrada y que no puede esperar hasta el siguiente da hbil, puede llamar/localizar a su doctor(a) al nmero que aparece a continuacin.   Por favor, tenga en cuenta que aunque hacemos todo lo posible para estar disponibles para asuntos urgentes fuera del horario de oficina, no estamos disponibles las 24 horas del da, los 7 das de la semana.   Si tiene un problema urgente y no puede comunicarse con nosotros, puede optar por buscar atencin mdica  en el consultorio de su doctor(a), en una clnica privada, en un centro de atencin urgente o en una sala de emergencias.  Si tiene una emergencia mdica, por favor llame inmediatamente al 911 o vaya a la sala de emergencias.  Nmeros de bper  - Dr. Kowalski: [phone]  - Dra. Moye: [phone]  - Dra. Stewart: [phone]  En caso de inclemencias del tiempo, por favor llame a nuestra lnea principal al [phone] para una actualizacin sobre el estado de cualquier retraso o cierre.  Consejos para la medicacin en dermatologa: Por favor, guarde las cajas en las que vienen los medicamentos de uso tpico para ayudarle a seguir las instrucciones sobre dnde y cmo usarlos. Las farmacias generalmente imprimen las instrucciones del medicamento slo en las cajas y no directamente en los tubos del medicamento.   Si su medicamento es muy caro, por favor, pngase en contacto con nuestra oficina llamando al [phone] y presione la opcin 4 o envenos un mensaje a travs de MyChart.   No podemos decirle cul ser su copago por los medicamentos por adelantado ya que esto es diferente dependiendo de la cobertura de su seguro. Sin embargo, es posible que podamos encontrar un medicamento sustituto a menor costo o llenar un formulario para que el  seguro cubra el medicamento que se considera necesario.   Si se requiere una autorizacin previa para que su compaa de seguros cubra su medicamento, por favor permtanos de 1 a 2 das hbiles para completar este proceso.  Los precios de los medicamentos varan con frecuencia dependiendo del lugar de dnde se surte la receta y alguna farmacias pueden ofrecer precios ms baratos.  El sitio web www.goodrx.com tiene cupones para medicamentos de diferentes farmacias. Los precios aqu no tienen en cuenta lo que podra costar con la ayuda del seguro (puede ser ms barato con su seguro), pero el sitio web puede darle el precio si no utiliz ningn seguro.  - Puede imprimir el cupn correspondiente y llevarlo con su receta a la farmacia.  - Tambin puede pasar por nuestra oficina durante el horario de atencin regular y recoger una tarjeta de cupones de GoodRx.  - Si necesita que su receta se enve electrnicamente a una farmacia diferente, informe a nuestra oficina a travs de MyChart de [place]   o por telfono llamando al [phone] y presione la opcin 4.  

## 2021-09-17 ENCOUNTER — Encounter: Payer: Self-pay | Admitting: Dermatology

## 2021-09-22 DIAGNOSIS — I10 Essential (primary) hypertension: Secondary | ICD-10-CM | POA: Diagnosis not present

## 2021-09-22 DIAGNOSIS — E039 Hypothyroidism, unspecified: Secondary | ICD-10-CM | POA: Diagnosis not present

## 2021-09-22 DIAGNOSIS — E782 Mixed hyperlipidemia: Secondary | ICD-10-CM | POA: Diagnosis not present

## 2021-09-22 DIAGNOSIS — E559 Vitamin D deficiency, unspecified: Secondary | ICD-10-CM | POA: Diagnosis not present

## 2021-09-28 ENCOUNTER — Ambulatory Visit (INDEPENDENT_AMBULATORY_CARE_PROVIDER_SITE_OTHER): Payer: Medicare HMO | Admitting: Licensed Clinical Social Worker

## 2021-09-28 DIAGNOSIS — F411 Generalized anxiety disorder: Secondary | ICD-10-CM | POA: Diagnosis not present

## 2021-09-28 DIAGNOSIS — R69 Illness, unspecified: Secondary | ICD-10-CM | POA: Diagnosis not present

## 2021-09-28 DIAGNOSIS — F330 Major depressive disorder, recurrent, mild: Secondary | ICD-10-CM

## 2021-09-29 ENCOUNTER — Ambulatory Visit: Payer: Medicare HMO | Admitting: Psychiatry

## 2021-09-29 ENCOUNTER — Encounter: Payer: Self-pay | Admitting: Psychiatry

## 2021-09-29 VITALS — BP 144/75 | HR 80 | Temp 97.3°F | Wt 188.2 lb

## 2021-09-29 DIAGNOSIS — F3342 Major depressive disorder, recurrent, in full remission: Secondary | ICD-10-CM | POA: Diagnosis not present

## 2021-09-29 DIAGNOSIS — R69 Illness, unspecified: Secondary | ICD-10-CM | POA: Diagnosis not present

## 2021-09-29 DIAGNOSIS — F3341 Major depressive disorder, recurrent, in partial remission: Secondary | ICD-10-CM

## 2021-09-29 DIAGNOSIS — F411 Generalized anxiety disorder: Secondary | ICD-10-CM

## 2021-09-29 MED ORDER — DULOXETINE HCL 30 MG PO CPEP: 30.0000 mg | ORAL_CAPSULE | ORAL | 0 refills | Status: DC

## 2021-09-29 NOTE — Progress Notes (Signed)
   THERAPIST PROGRESS NOTE  Session Time: [place] in office visit for patient and LCSW clinician  Participation Level: Active  Behavioral Response: NeatAlertAnxious  Type of Therapy: Individual Therapy  Treatment Goals addressed:   ProgressTowards Goals: Progressing  Interventions: CBT and Other: trauma focused  Summary: Shannon Crawford is a 66 y.o. female who presents with improving symptoms related to depression and anxiety diagnoses.   Allowed pt to explore and express thoughts and feelings associated with recent life situations and external stressors. Patient reports that she is continuing to do some quilting in her spare time, and this is a hobby that brings her a lot of joy. Patient reports that it also helps relieve stress. Patient reports that overall mood has been stable and that she feels that she is managing external stressors and anxiety well. Patient reports good quality and quantity of sleep.  Discussed eating behaviors--patient has decided that she wants to do keto and has also stopped drinking in the process. Patient reports that she does feel better eating the keto diet and decreasing overall alcohol consumption. Patient reports that her husband has type 2 diabetes and he is also engaging in similar eating behaviors as patient, and feels that it's helping him as well.  Patient reports that she's doing red light therapy on her skin, and feels that it may be helping with her overall mood as well. Discussed How light therapy can be helpful for some kinds of depression.  Overall patient is very pleased with her progress and goals are maintaining current levels of progress to decrease chances of symptom relapse.  Continued recommendations are as follows: self care behaviors, positive social engagements, focusing on overall work/home/life balance, and focusing on positive physical and emotional wellness.    Suicidal/Homicidal:  No  Therapist Response: Pt is continuing to apply interventions learned in session into daily life situations. Pt is currently on track to meet goals utilizing interventions mentioned above. Personal growth and progress noted. Treatment to continue as indicated.   Plan: Return again in 4 weeks.  Diagnosis:  Encounter Diagnoses  Name Primary?   MDD (major depressive disorder), recurrent episode, mild (HCC) Yes   GAD (generalized anxiety disorder)     Collaboration of Care: Other Pt encouraged to continue care with psychiatrist of record, Dr. Ursula Alert  Patient/Guardian was advised Release of Information must be obtained prior to any record release in order to collaborate their care with an outside provider. Patient/Guardian was advised if they have not already done so to contact the registration department to sign all necessary forms in order for [place] to release information regarding their care.   Consent: Patient/Guardian gives verbal consent for treatment and assignment of benefits for services provided during this visit. Patient/Guardian expressed understanding and agreed to proceed.   [place], LCSW 09/29/2021

## 2021-09-29 NOTE — Progress Notes (Signed)
[facility] OP Progress Note  09/29/2021 2:44 PM Shannon Crawford  MRN:  [id number]  Chief Complaint:  Chief Complaint  Patient presents with   Follow-up: 66 year old female who has a history of GAD, MDD, presented for medication management   HPI: Shannon Crawford is a 66 year old Caucasian female Caucasian female who has a history of GAD, MDD, chronic pain syndrome, vitiligo, hypertension, hypothyroidism, hyperlipidemia was evaluated in office today.  Patient today reports she is currently doing well with regards to her mood.  Denies any significant sadness.  Reports Wellbutrin is beneficial and she feels motivated to do several things including her quilting projects.  Patient denies any significant anxiety symptoms.  Patient is currently compliant on the Wellbutrin and the Cymbalta.  Would like to get off of the Cymbalta if possible.  She already stopped taking the gabapentin and reports she is doing well.  She is trying to lose weight.  She has lost 12 pounds in the past couple of months.  Excited about that.  Currently following a Keto diet.  Completely quit alcohol.  Does use cannabis Gummies half of it which helps with sciatica.  Patient reports sleep as restless due to her sciatica.  She also reports she has to get up to urinate several times at night.  Continues to use CPAP.  Denies any suicidality, homicidality or perceptual disturbances.  Patient denies any other concerns today.  Visit Diagnosis:    ICD-10-CM   1. GAD (generalized anxiety disorder)  F41.1 DULoxetine (CYMBALTA) 30 MG capsule    2. MDD (major depressive disorder), recurrent, in full remission ([place])  F33.42       Past Psychiatric History: Reviewed past psychiatric history from progress note on 03/29/2021.  Past trials of medications like Lexapro, Zoloft, duloxetine, zolpidem, Rexulti, Xanax, Topamax.  Past Medical History:  Past Medical History:  Diagnosis Date   Anxiety    Fracture    ankle   Heart murmur    Hypothyroid    Thyroid  disease     Past Surgical History:  Procedure Laterality Date   ABDOMINAL SURGERY     tummy tuck   BREAST BIOPSY Left 10+ years   Negative- core bx    Family Psychiatric History: Reviewed family psychiatric history from progress note on 03/29/2021.  Family History:  Family History  Problem Relation Age of Onset   Hypertension Mother    Alcohol abuse Father    Anxiety disorder Sister    Prostate cancer Brother    Hypertension Sister    Breast cancer Sister 5   Breast cancer Cousin     Social History: Reviewed social history from progress note on 03/29/2021. Social History   Socioeconomic History   Marital status: Married    Spouse name: John   Number of children: 2   Years of education: Not on file   Highest education level: Some college, no degree  Occupational History   Not on file  Tobacco Use   Smoking status: Never   Smokeless tobacco: Never  Vaping Use   Vaping Use: Never used  Substance and Sexual Activity   Alcohol use: Yes    Alcohol/week: 12.0 standard drinks of alcohol    Types: 12 Cans of beer per week   Drug use: No   Sexual activity: Yes  Other Topics Concern   Not on file  Social History Narrative   Not on file   Social Determinants of Health   Financial Resource Strain: Not on file  Food Insecurity:  Not on file  Transportation Needs: Not on file  Physical Activity: Not on file  Stress: Not on file  Social Connections: Not on file    Allergies:  Allergies  Allergen Reactions   Catfish [Fish Allergy] Nausea And Vomiting    Metabolic Disorder Labs: No results found for: "HGBA1C", "MPG" No results found for: "PROLACTIN" No results found for: "CHOL", "TRIG", "HDL", "CHOLHDL", "VLDL", "LDLCALC" No results found for: "TSH"  Therapeutic Level Labs: No results found for: "LITHIUM" No results found for: "VALPROATE" No results found for: "CBMZ"  Current Medications: Current Outpatient Medications  Medication Sig Dispense Refill   B  Complex-C (SUPER B COMPLEX PO) Take by mouth.     bimatoprost (LATISSE) 0.03 % ophthalmic solution Place 1 application  into both eyes at bedtime. Place one drop on applicator and apply evenly along the skin of the upper eyelid at base of eyelashes once daily at bedtime; repeat procedure for second eye (use a clean applicator). 3 mL 12   buPROPion (WELLBUTRIN) 75 MG tablet TAKE ONE TABLET BY MOUTH DAILY WITH BREAKFAST 30 tablet 2   Cholecalciferol (D-3-5) 125 MCG (5000 UT) capsule Take 5,000 Units by mouth daily.     levothyroxine (SYNTHROID) 88 MCG tablet Take 88 mcg by mouth daily before breakfast.     losartan (COZAAR) 50 MG tablet Take 50 mg by mouth daily.     meloxicam (MOBIC) 7.5 MG tablet      Omega-3 Fatty Acids (FISH OIL) 1000 MG CAPS Take by mouth.     rosuvastatin (CRESTOR) 20 MG tablet 20 mg daily.     Ruxolitinib Phosphate (OPZELURA) 1.5 % CREA Apply 1 Application topically daily. 60 g 6   zolpidem (AMBIEN) 5 MG tablet Take 1 tablet (5 mg total) by mouth at bedtime as needed for sleep. 30 tablet 1   DULoxetine (CYMBALTA) 30 MG capsule Take 1 capsule (30 mg total) by mouth as directed. Take every other day for 10 doses and stop 90 capsule 0   lidocaine (LIDODERM) 5 %      No current facility-administered medications for this visit.     Musculoskeletal: Strength & Muscle Tone: within normal limits Gait & Station: normal Patient leans: N/A  Psychiatric Specialty Exam: Review of Systems  Musculoskeletal:        Reports sciatica - pain radiating down her legs  Skin:  Positive for color change (chronic - has vitiligo).  Psychiatric/Behavioral:  Positive for sleep disturbance (due to need to urinate at night).   All other systems reviewed and are negative.   Blood pressure (!) 144/75, pulse 80, temperature (!) 97.3 F (36.3 C), temperature source Temporal, weight 188 lb 3.2 oz (85.4 kg).Body mass index is 34.42 kg/m.  General Appearance: Casual  Eye Contact:  Fair  Speech:   Clear and Coherent  Volume:  Normal  Mood:  Euthymic  Affect:  Congruent  Thought Process:  Goal Directed and Descriptions of Associations: Intact  Orientation:  Full (Time, Place, and Person)  Thought Content: Logical   Suicidal Thoughts:  No  Homicidal Thoughts:  No  Memory:  Immediate;   Fair Recent;   Fair Remote;   Fair  Judgement:  Intact  Insight:  Fair  Psychomotor Activity:  Normal  Concentration:  Concentration: Fair and Attention Span: Fair  Recall:  [employer] of Knowledge: Fair  Language: Fair  Akathisia:  No  Handed:  Right  AIMS (if indicated): done  Assets:  Communication Skills Desire for Improvement  Social Support  ADL's:  Intact  Cognition: WNL  Sleep:   restless due to need to urinate   Screenings: [place] Office Visit from 09/29/2021 in [facility] Office Visit from 07/14/2021 in [facility] Total Score 0 0      AUDIT    [place] Office Visit from 03/29/2021 in [facility]  Alcohol Use Disorder Identification Test Final Score (AUDIT) 13      GAD-7    [place] Office Visit from 09/29/2021 in [facility] Office Visit from 06/15/2021 in [facility] Counselor from 05/12/2021 in [facility] Office Visit from 05/03/2021 in [facility] Office Visit from 03/29/2021 in [facility]  Total GAD-7 Score '4 8 8 '$ 0 5      PHQ2-9    [place] Office Visit from 09/29/2021 in [facility] Office Visit from 07/14/2021 in [facility] Office Visit from 06/15/2021 in [facility] Counselor from 05/12/2021 in [facility] Office Visit from 05/03/2021 in [facility]  PHQ-2 Total  Score '2 2 4 3 2  '$ PHQ-9 Total Score '6 11 14 16 9      '$ [place] Office Visit from 09/29/2021 in [facility] Office Visit from 07/14/2021 in [facility] Office Visit from 06/15/2021 in [facility] No Risk Error: Q3, 4, or 5 should not be populated when Q2 is No Low Risk        Assessment and Plan: Shannon Crawford is a 66 year old female, married, lives in [place], has a history of MDD, hypothyroidism, chronic pain syndrome, hypertension, hyperlipidemia was evaluated in office today.  Patient is currently doing fairly well except for restless sleep which likely is due to sciatica and the need to urinate at night.  Discussed plan as noted below.  Plan GAD-improving Taper of Cymbalta.  Patient advised to start taking Cymbalta 30 mg every other day for 10 doses and stop taking it. Wellbutrin 75 mg p.o. daily in the morning.   Discontinue gabapentin patient stopped taking it-tapered herself off. Continue CBT.  MDD in remission Wellbutrin 75 mg p.o. daily Zolpidem 5 mg p.o. nightly as needed Continue CBT  Follow-up in clinic in 2 to 3 months or sooner if needed.   This note was generated in part or whole with voice recognition software. Voice recognition is usually quite accurate but there are transcription errors that can and very often do occur. I apologize for any typographical errors that were not detected and corrected.      Ursula Alert, MD 09/30/2021, 8:30 AM

## 2021-10-20 ENCOUNTER — Ambulatory Visit (INDEPENDENT_AMBULATORY_CARE_PROVIDER_SITE_OTHER): Payer: Medicare HMO | Admitting: Licensed Clinical Social Worker

## 2021-10-20 DIAGNOSIS — F3341 Major depressive disorder, recurrent, in partial remission: Secondary | ICD-10-CM | POA: Diagnosis not present

## 2021-10-20 DIAGNOSIS — F411 Generalized anxiety disorder: Secondary | ICD-10-CM

## 2021-10-20 DIAGNOSIS — R69 Illness, unspecified: Secondary | ICD-10-CM | POA: Diagnosis not present

## 2021-10-21 NOTE — Progress Notes (Unsigned)
   THERAPIST PROGRESS NOTE  Session Time: [place] in office visit for patient and LCSW clinician  Participation Level: Active  Behavioral Response: NeatAlertAnxious  Type of Therapy: Individual Therapy  Treatment Goals addressed: Patient reports that she still has good days and bad days, typically triggered by feeling self-conscious about her skin condition. Patient reports that she has tried new medications but does not feel that they are working to manage her symptoms.  Patient reports that she recently went on a quilt retreat with some ladies that she is very close to, and found it very beneficial and very healing.  Patient reports fewer feelings of suicidal ideation or depression recently. Patient reports that she is actively losing weight, which makes her feel better about her physical appearance, increases confidence overall, and patient feels her self esteem is higher.  Patient reports that her medications for managing depression and anxiety were changed recently, and patient feels it was a very positive change.  Allowed patient to explore and identify situational triggers, and interventions that patient has used to navigate and manage through situations. Patient very calm in her reflections and feels that she is in a good place right now.  Continued recommendations are as follows: self care behaviors, positive social engagements, focusing on overall work/home/life balance, and focusing on positive physical and emotional wellness.   ProgressTowards Goals: Progressing  Interventions: CBT and Other: trauma focused  Summary: Shannon Crawford is a 66 y.o. female who presents with improving symptoms related to depression and anxiety diagnoses.   Allowed pt to explore and express thoughts and feelings associated with recent life situations and external stressors.   Continued recommendations are as follows: self care behaviors, positive social  engagements, focusing on overall work/home/life balance, and focusing on positive physical and emotional wellness.    Suicidal/Homicidal: No  Therapist Response: Pt is continuing to apply interventions learned in session into daily life situations. Pt is currently on track to meet goals utilizing interventions mentioned above. Personal growth and progress noted. Treatment to continue as indicated.   Plan: Return again in 4 weeks.  Diagnosis:  Encounter Diagnoses  Name Primary?   MDD (major depressive disorder), recurrent, in partial remission ([place]) Yes   GAD (generalized anxiety disorder)     Collaboration of Care: Other Pt encouraged to continue care with psychiatrist of record, Dr. Ursula Alert  Patient/Guardian was advised Release of Information must be obtained prior to any record release in order to collaborate their care with an outside provider. Patient/Guardian was advised if they have not already done so to contact the registration department to sign all necessary forms in order for [place] to release information regarding their care.   Consent: Patient/Guardian gives verbal consent for treatment and assignment of benefits for services provided during this visit. Patient/Guardian expressed understanding and agreed to proceed.   [place], LCSW 10/21/2021

## 2021-11-07 ENCOUNTER — Other Ambulatory Visit: Payer: Self-pay | Admitting: Psychiatry

## 2021-11-07 DIAGNOSIS — F3341 Major depressive disorder, recurrent, in partial remission: Secondary | ICD-10-CM

## 2021-12-14 ENCOUNTER — Ambulatory Visit (HOSPITAL_COMMUNITY): Payer: Medicare HMO | Admitting: Licensed Clinical Social Worker

## 2021-12-28 ENCOUNTER — Encounter: Payer: Self-pay | Admitting: Psychiatry

## 2021-12-28 ENCOUNTER — Ambulatory Visit: Payer: Medicare HMO | Admitting: Psychiatry

## 2021-12-28 VITALS — BP 117/72 | HR 85 | Temp 98.8°F | Ht 62.0 in | Wt 176.0 lb

## 2021-12-28 DIAGNOSIS — F3342 Major depressive disorder, recurrent, in full remission: Secondary | ICD-10-CM

## 2021-12-28 DIAGNOSIS — F411 Generalized anxiety disorder: Secondary | ICD-10-CM | POA: Diagnosis not present

## 2021-12-28 DIAGNOSIS — F3341 Major depressive disorder, recurrent, in partial remission: Secondary | ICD-10-CM | POA: Insufficient documentation

## 2021-12-28 DIAGNOSIS — R69 Illness, unspecified: Secondary | ICD-10-CM | POA: Diagnosis not present

## 2021-12-28 MED ORDER — DULOXETINE HCL 20 MG PO CPEP: 20.0000 mg | ORAL_CAPSULE | Freq: Every day | ORAL | 0 refills | Status: DC

## 2021-12-28 NOTE — Progress Notes (Signed)
[place] MD OP Progress Note  12/28/2021 2:39 PM Shannon Crawford  MRN:  [id number]  Chief Complaint:  Chief Complaint  Patient presents with   Follow-up   Depression   Anxiety   Medication Refill   HPI: Shannon Crawford is a 66 year old, 27 female who has a history of GAD, MDD, chronic pain syndrome, vitiligo, hypertension, hypothyroidism, hyperlipidemia was evaluated in office today.  Patient today reports she is currently in clinical trial for treatment of vitiligo and is currently on an experimental medication.  She reports she just started taking it a few days ago.  Has not noticed any benefit from it yet.  Reports over all mood symptoms are stable.  Denies any significant depression symptoms.  Denies any significant anxiety.  Has been compliant on the Wellbutrin which does help.  Reports she is currently taking the Cymbalta, did not taper it off as discussed last visit.  Reports when she stopped it she had a crying spell and went back on it.  She reports she is interested in reducing the dosage today.  Patient reports she is currently watching her diet, on a keto diet, exercises regularly.  Has been losing weight, excited about that.  Patient denies any suicidality, homicidality or perceptual disturbances.  Patient denies any other concerns today.  Visit Diagnosis:    ICD-10-CM   1. GAD (generalized anxiety disorder)  F41.1 DULoxetine (CYMBALTA) 20 MG capsule    2. MDD (major depressive disorder), recurrent, in full remission ([place])  F33.42 DULoxetine (CYMBALTA) 20 MG capsule      Past Psychiatric History: Reviewed past psychiatric history from progress note on 03/29/2021.  Past trials of medications like Lexapro, Zoloft, duloxetine, zolpidem, Rexulti, Xanax, Topamax.  Past Medical History:  Past Medical History:  Diagnosis Date   Anxiety    Fracture    ankle   Heart murmur    Hypothyroid    Thyroid disease     Past Surgical History:  Procedure Laterality Date    ABDOMINAL SURGERY     tummy tuck   BREAST BIOPSY Left 10+ years   Negative- core bx    Family Psychiatric History: Reviewed family psychiatric history from progress note on 03/29/2021.  Family History:  Family History  Problem Relation Age of Onset   Hypertension Mother    Alcohol abuse Father    Anxiety disorder Sister    Prostate cancer Brother    Hypertension Sister    Breast cancer Sister 48   Breast cancer Cousin     Social History: Reviewed social history from progress note on 03/29/2021. Social History   Socioeconomic History   Marital status: Married    Spouse name: John   Number of children: 2   Years of education: Not on file   Highest education level: Some college, no degree  Occupational History   Not on file  Tobacco Use   Smoking status: Never   Smokeless tobacco: Never  Vaping Use   Vaping Use: Never used  Substance and Sexual Activity   Alcohol use: Yes    Alcohol/week: 12.0 standard drinks of alcohol    Types: 12 Cans of beer per week   Drug use: No   Sexual activity: Yes  Other Topics Concern   Not on file  Social History Narrative   Not on file   Social Determinants of Health   Financial Resource Strain: Not on file  Food Insecurity: Not on file  Transportation Needs: Not on file  Physical Activity: Not  on file  Stress: Not on file  Social Connections: Not on file    Allergies:  Allergies  Allergen Reactions   Catfish [Fish Allergy] Nausea And Vomiting    Metabolic Disorder Labs: No results found for: "HGBA1C", "MPG" No results found for: "PROLACTIN" No results found for: "CHOL", "TRIG", "HDL", "CHOLHDL", "VLDL", "LDLCALC" No results found for: "TSH"  Therapeutic Level Labs: No results found for: "LITHIUM" No results found for: "VALPROATE" No results found for: "CBMZ"  Current Medications: Current Outpatient Medications  Medication Sig Dispense Refill   B Complex-C (SUPER B COMPLEX PO) Take by mouth.     bimatoprost  (LATISSE) 0.03 % ophthalmic solution Place 1 application  into both eyes at bedtime. Place one drop on applicator and apply evenly along the skin of the upper eyelid at base of eyelashes once daily at bedtime; repeat procedure for second eye (use a clean applicator). 3 mL 12   buPROPion (WELLBUTRIN) 75 MG tablet TAKE ONE TABLET BY MOUTH DAILY WITH BREAKFAST 90 tablet 0   Cholecalciferol (D-3-5) 125 MCG (5000 UT) capsule Take 5,000 Units by mouth daily.     DULoxetine (CYMBALTA) 20 MG capsule Take 1 capsule (20 mg total) by mouth daily. DOSE CHANGE 90 capsule 0   levothyroxine (SYNTHROID) 88 MCG tablet Take 88 mcg by mouth daily before breakfast.     losartan-hydrochlorothiazide (HYZAAR) 100-25 MG tablet Take 1 tablet by mouth daily.     NON FORMULARY 50 mg. STUDY [id number] RITLECITINIB 50 MG/PLACEBO CAPSULE     rosuvastatin (CRESTOR) 20 MG tablet 20 mg daily.     Ruxolitinib Phosphate (OPZELURA) 1.5 % CREA Apply 1 Application topically daily. 60 g 6   zolpidem (AMBIEN) 5 MG tablet Take 1 tablet (5 mg total) by mouth at bedtime as needed for sleep. 30 tablet 1   No current facility-administered medications for this visit.     Musculoskeletal: Strength & Muscle Tone: within normal limits Gait & Station: normal Patient leans: N/A  Psychiatric Specialty Exam: Review of Systems  Skin:  Positive for color change (chronic , has vitiligo).  Psychiatric/Behavioral: Negative.    All other systems reviewed and are negative.   Blood pressure 117/72, pulse 85, temperature 98.8 F (37.1 C), temperature source Oral, height '5\' 2"'$  (1.575 m), weight 176 lb (79.8 kg).Body mass index is 32.19 kg/m.  General Appearance: Casual  Eye Contact:  Fair  Speech:  Clear and Coherent  Volume:  Normal  Mood:  Euthymic  Affect:  Congruent  Thought Process:  Goal Directed and Descriptions of Associations: Intact  Orientation:  Full (Time, Place, and Person)  Thought Content: Logical   Suicidal Thoughts:  No   Homicidal Thoughts:  No  Memory:  Immediate;   Fair Recent;   Fair Remote;   Fair  Judgement:  Fair  Insight:  Fair  Psychomotor Activity:  Normal  Concentration:  Concentration: Fair and Attention Span: Fair  Recall:  [employer] of Knowledge: Fair  Language: Fair  Akathisia:  No  Handed:  Right  AIMS (if indicated): not done  Assets:  Communication Skills Desire for Improvement Housing Intimacy Social Support  ADL's:  Intact  Cognition: WNL  Sleep:  Fair   Screenings: AIMS    [place] Office Visit from 09/29/2021 in [facility] Office Visit from 07/14/2021 in [facility] Total Score 0 0      AUDIT    [place] Office Visit from 03/29/2021 in [facility]  Alcohol Use Disorder Identification Test Final Score (AUDIT) 13      GAD-7    [place] Office Visit from 12/28/2021 in [facility] Office Visit from 09/29/2021 in [facility] Office Visit from 06/15/2021 in [facility] from 05/12/2021 in [place] Office Visit from 05/03/2021 in [place]  Total GAD-7 Score '1 4 8 8 '$ 0      PHQ2-9    [place] Office Visit from 12/28/2021 in [facility] Office Visit from 09/29/2021 in [place] Visit from 07/14/2021 in [place] Office Visit from 06/15/2021 in [facility] Counselor from 05/12/2021 in [place]  PHQ-2 Total Score '1 2 2 4 3  '$ PHQ-9 Total Score '5 6 11 14 16      '$ [place] Office Visit from 12/28/2021 in [facility] Counselor from 10/20/2021 in [facility] Office Visit from 09/29/2021 in [facility]  C-SSRS RISK CATEGORY No Risk No Risk No Risk        Assessment and Plan: GERMANI GAVILANES is a 66 year old female, married, lives in [place], has a history of MDD, hypothyroidism, chronic pain syndrome, hypertension, hyperlipidemia was evaluated in office today.  Patient is currently stable.  Plan as noted below.  Plan GAD-improving Taper of Cymbalta, will reduce Cymbalta to 20 mg p.o. daily. Wellbutrin 75 mg p.o. daily in the morning Continue CBT as needed  MDD in remission Wellbutrin 75 mg p.o. daily Zolpidem 5 mg p.o. nightly as needed Continue CBT as needed   Patient advised to repeat TSH, has upcoming appointment with primary care provider.  Follow-up in clinic in 3 months or sooner if needed.   This note was generated in part or whole with voice recognition software. Voice recognition is usually quite accurate but there are transcription errors that can and very often do occur. I apologize for any typographical errors that were not detected and corrected.    Ursula Alert, MD 12/28/2021, 2:39 PM

## 2021-12-29 ENCOUNTER — Ambulatory Visit
Admission: RE | Admit: 2021-12-29 | Discharge: 2021-12-29 | Disposition: A | Discharge status: Home / Self Care | Payer: Medicare HMO | Source: Ambulatory Visit | Attending: Internal Medicine | Admitting: Internal Medicine

## 2021-12-29 ENCOUNTER — Other Ambulatory Visit: Payer: Self-pay | Admitting: Internal Medicine

## 2021-12-29 DIAGNOSIS — I10 Essential (primary) hypertension: Secondary | ICD-10-CM

## 2021-12-29 DIAGNOSIS — R0602 Shortness of breath: Secondary | ICD-10-CM | POA: Insufficient documentation

## 2021-12-29 DIAGNOSIS — R9431 Abnormal electrocardiogram [ECG] [EKG]: Secondary | ICD-10-CM | POA: Diagnosis not present

## 2021-12-29 DIAGNOSIS — E782 Mixed hyperlipidemia: Secondary | ICD-10-CM | POA: Diagnosis not present

## 2022-01-03 ENCOUNTER — Ambulatory Visit (INDEPENDENT_AMBULATORY_CARE_PROVIDER_SITE_OTHER): Payer: Medicare HMO | Admitting: Dermatology

## 2022-01-03 DIAGNOSIS — L988 Other specified disorders of the skin and subcutaneous tissue: Secondary | ICD-10-CM | POA: Diagnosis not present

## 2022-01-03 DIAGNOSIS — L80 Vitiligo: Secondary | ICD-10-CM

## 2022-01-03 NOTE — Patient Instructions (Signed)
Due to recent changes in healthcare laws, you may see results of your pathology and/or laboratory studies on MyChart before the doctors have had a chance to review them. We understand that in some cases there may be results that are confusing or concerning to you. Please understand that not all results are received at the same time and often the doctors may need to interpret multiple results in order to provide you with the best plan of care or course of treatment. Therefore, we ask that you please give us 2 business days to thoroughly review all your results before contacting the office for clarification. Should we see a critical lab result, you will be contacted sooner.   If You Need Anything After Your Visit  If you have any questions or concerns for your doctor, please call our main line at [phone] and press option 4 to reach your doctor's medical assistant. If no one answers, please leave a voicemail as directed and we will return your call as soon as possible. Messages left after 4 pm will be answered the following business day.   You may also send us a message via MyChart. We typically respond to MyChart messages within 1-2 business days.  For prescription refills, please ask your pharmacy to contact our office. Our fax number is [phone].  If you have an urgent issue when the clinic is closed that cannot wait until the next business day, you can page your doctor at the number below.    Please note that while we do our best to be available for urgent issues outside of office hours, we are not available 24/7.   If you have an urgent issue and are unable to reach us, you may choose to seek medical care at your doctor's office, retail clinic, urgent care center, or emergency room.  If you have a medical emergency, please immediately call 911 or go to the emergency department.  Pager Numbers  - Dr. Kowalski: [phone]  - Dr. Moye: [phone]  - Dr. Stewart:  [phone]  In the event of inclement weather, please call our main line at [phone] for an update on the status of any delays or closures.  Dermatology Medication Tips: Please keep the boxes that topical medications come in in order to help keep track of the instructions about where and how to use these. Pharmacies typically print the medication instructions only on the boxes and not directly on the medication tubes.   If your medication is too expensive, please contact our office at [phone] option 4 or send us a message through MyChart.   We are unable to tell what your co-pay for medications will be in advance as this is different depending on your insurance coverage. However, we may be able to find a substitute medication at lower cost or fill out paperwork to get insurance to cover a needed medication.   If a prior authorization is required to get your medication covered by your insurance company, please allow us 1-2 business days to complete this process.  Drug prices often vary depending on where the prescription is filled and some pharmacies may offer cheaper prices.  The website www.goodrx.com contains coupons for medications through different pharmacies. The prices here do not account for what the cost may be with help from insurance (it may be cheaper with your insurance), but the website can give you the price if you did not use any insurance.  - You can print the associated coupon and take it with   your prescription to the pharmacy.  - You may also stop by our office during regular business hours and pick up a GoodRx coupon card.  - If you need your prescription sent electronically to a different pharmacy, notify our office through [place] MyChart or by phone at [phone] option 4.     Si Usted Necesita Algo Despus de Su Visita  Tambin puede enviarnos un mensaje a travs de MyChart. Por lo general respondemos a los mensajes de MyChart en el transcurso de 1 a 2  das hbiles.  Para renovar recetas, por favor pida a su farmacia que se ponga en contacto con nuestra oficina. Nuestro nmero de fax es el [phone].  Si tiene un asunto urgente cuando la clnica est cerrada y que no puede esperar hasta el siguiente da hbil, puede llamar/localizar a su doctor(a) al nmero que aparece a continuacin.   Por favor, tenga en cuenta que aunque hacemos todo lo posible para estar disponibles para asuntos urgentes fuera del horario de oficina, no estamos disponibles las 24 horas del da, los 7 das de la semana.   Si tiene un problema urgente y no puede comunicarse con nosotros, puede optar por buscar atencin mdica  en el consultorio de su doctor(a), en una clnica privada, en un centro de atencin urgente o en una sala de emergencias.  Si tiene una emergencia mdica, por favor llame inmediatamente al 911 o vaya a la sala de emergencias.  Nmeros de bper  - Dr. Kowalski: [phone]  - Dra. Moye: [phone]  - Dra. Stewart: [phone]  En caso de inclemencias del tiempo, por favor llame a nuestra lnea principal al [phone] para una actualizacin sobre el estado de cualquier retraso o cierre.  Consejos para la medicacin en dermatologa: Por favor, guarde las cajas en las que vienen los medicamentos de uso tpico para ayudarle a seguir las instrucciones sobre dnde y cmo usarlos. Las farmacias generalmente imprimen las instrucciones del medicamento slo en las cajas y no directamente en los tubos del medicamento.   Si su medicamento es muy caro, por favor, pngase en contacto con nuestra oficina llamando al [phone] y presione la opcin 4 o envenos un mensaje a travs de MyChart.   No podemos decirle cul ser su copago por los medicamentos por adelantado ya que esto es diferente dependiendo de la cobertura de su seguro. Sin embargo, es posible que podamos encontrar un medicamento sustituto a menor costo o llenar un formulario para que el  seguro cubra el medicamento que se considera necesario.   Si se requiere una autorizacin previa para que su compaa de seguros cubra su medicamento, por favor permtanos de 1 a 2 das hbiles para completar este proceso.  Los precios de los medicamentos varan con frecuencia dependiendo del lugar de dnde se surte la receta y alguna farmacias pueden ofrecer precios ms baratos.  El sitio web www.goodrx.com tiene cupones para medicamentos de diferentes farmacias. Los precios aqu no tienen en cuenta lo que podra costar con la ayuda del seguro (puede ser ms barato con su seguro), pero el sitio web puede darle el precio si no utiliz ningn seguro.  - Puede imprimir el cupn correspondiente y llevarlo con su receta a la farmacia.  - Tambin puede pasar por nuestra oficina durante el horario de atencin regular y recoger una tarjeta de cupones de GoodRx.  - Si necesita que su receta se enve electrnicamente a una farmacia diferente, informe a nuestra oficina a travs de MyChart de [place]   o por telfono llamando al [phone] y presione la opcin 4.  

## 2022-01-03 NOTE — Progress Notes (Signed)
   Follow-Up Visit   Subjective  Shannon Crawford is a 66 y.o. female who presents for the following: Facial Elastosis (Patient is here today for Botox injections) and Vitiligo (Face, trunk, extremities - patient used Opzelura for a few months and did see some re-pigmentation in one area, but she is now in a clinical study).  The following portions of the chart were reviewed this encounter and updated as appropriate:   Tobacco  Allergies  Meds  Problems  Med Hx  Surg Hx  Fam Hx     Review of Systems:  No other skin or systemic complaints except as noted in HPI or Assessment and Plan.  Objective  Well appearing patient in no apparent distress; mood and affect are within normal limits.  A focused examination was performed including the face. Relevant physical exam findings are noted in the Assessment and Plan.  Face Rhytides and volume loss.    Assessment & Plan  Elastosis of skin Face Botox today  55 units injected as marked:  - Frown Complex 35 units - Crow's Feet 10 units each side  Botox Injection - Face Location: See attached image  Informed consent: Discussed risks (infection, pain, bleeding, bruising, swelling, allergic reaction, paralysis of nearby muscles, eyelid droop, double vision, neck weakness, difficulty breathing, headache, undesirable cosmetic result, and need for additional treatment) and benefits of the procedure, as well as the alternatives.  Informed consent was obtained.  Preparation: The area was cleansed with alcohol.  Procedure Details:  Botox was injected into the dermis with a 30-gauge needle. Pressure applied to any bleeding. Ice packs offered for swelling.  Lot Number:  [id number] Expiration:  03/2024  Total Units Injected:  55  Plan: Patient was instructed to remain upright for 4 hours. Patient was instructed to avoid massaging the face and avoid vigorous exercise for the rest of the day. Tylenol may be used for headache.  Allow 2 weeks before  returning to clinic for additional dosing as needed. Patient will call for any problems.  Vitiligo Face, trunk, extremities Patient currently in Ritlecitinib study through [facility] has discontinued Opzelura when starting study.  Related Medications Ruxolitinib Phosphate (OPZELURA) 1.5 % CREA Apply 1 Application topically daily.  Return in about 4 months (around 05/04/2022) for Botox injections.  Luther Redo, CMA, am acting as scribe for Sarina Ser, MD . Documentation: I have reviewed the above documentation for accuracy and completeness, and I agree with the above.  Sarina Ser, MD

## 2022-01-10 DIAGNOSIS — R0602 Shortness of breath: Secondary | ICD-10-CM | POA: Diagnosis not present

## 2022-01-10 DIAGNOSIS — Z0000 Encounter for general adult medical examination without abnormal findings: Secondary | ICD-10-CM | POA: Diagnosis not present

## 2022-01-10 DIAGNOSIS — L80 Vitiligo: Secondary | ICD-10-CM | POA: Diagnosis not present

## 2022-01-10 DIAGNOSIS — R9431 Abnormal electrocardiogram [ECG] [EKG]: Secondary | ICD-10-CM | POA: Diagnosis not present

## 2022-01-10 DIAGNOSIS — E782 Mixed hyperlipidemia: Secondary | ICD-10-CM | POA: Diagnosis not present

## 2022-01-10 DIAGNOSIS — E039 Hypothyroidism, unspecified: Secondary | ICD-10-CM | POA: Diagnosis not present

## 2022-01-10 DIAGNOSIS — M2550 Pain in unspecified joint: Secondary | ICD-10-CM | POA: Diagnosis not present

## 2022-01-10 DIAGNOSIS — I10 Essential (primary) hypertension: Secondary | ICD-10-CM | POA: Diagnosis not present

## 2022-01-11 ENCOUNTER — Other Ambulatory Visit: Payer: Self-pay | Admitting: Internal Medicine

## 2022-01-11 DIAGNOSIS — E782 Mixed hyperlipidemia: Secondary | ICD-10-CM | POA: Diagnosis not present

## 2022-01-11 DIAGNOSIS — Z1231 Encounter for screening mammogram for malignant neoplasm of breast: Secondary | ICD-10-CM

## 2022-01-11 DIAGNOSIS — M2550 Pain in unspecified joint: Secondary | ICD-10-CM | POA: Diagnosis not present

## 2022-01-11 DIAGNOSIS — E039 Hypothyroidism, unspecified: Secondary | ICD-10-CM | POA: Diagnosis not present

## 2022-01-17 ENCOUNTER — Encounter: Payer: Self-pay | Admitting: Dermatology

## 2022-01-18 ENCOUNTER — Ambulatory Visit (INDEPENDENT_AMBULATORY_CARE_PROVIDER_SITE_OTHER): Payer: Medicare HMO | Admitting: Licensed Clinical Social Worker

## 2022-01-18 DIAGNOSIS — F3341 Major depressive disorder, recurrent, in partial remission: Secondary | ICD-10-CM | POA: Diagnosis not present

## 2022-01-18 DIAGNOSIS — F411 Generalized anxiety disorder: Secondary | ICD-10-CM

## 2022-01-18 DIAGNOSIS — R69 Illness, unspecified: Secondary | ICD-10-CM | POA: Diagnosis not present

## 2022-01-18 NOTE — Progress Notes (Signed)
THERAPIST PROGRESS NOTE  Session Time: [place] in office visit for patient and LCSW clinician  Participation Level: Active  Behavioral Response: NeatAlertAnxious  Type of Therapy: Individual Therapy  Treatment Goals addressed:  Problem: Depression Goal: LTG: Decrease depressive symptoms and improve levels of effective functioning-pt reports a decrease in overall depression symptoms 3 out of 5 sessions documented.  Outcome: Progressing Goal: STG: Develop healthy thinking patterns and beliefs about self, others, and the world that lead to the alleviation and help prevent the relapse of depression. 3 out of 5 sessions documented Outcome: Progressing Intervention: Encourage verbalization of feelings/concerns/expectations Note: Allowed pt to explore/express Intervention: Encourage self-care activities Note: Reviewed  Intervention: REVIEW PLEASE SKILLS (TREAT PHYSICAL ILLNESS, BALANCE EATING, AVOID MOOD-ALTERING SUBSTANCES, BALANCE SLEEP AND GET EXERCISE) WITH Rajanee Note: Reviewed--pt focusing on balanced eating   Problem: Anxiety Disorder Goal: LTG: Reduce overall frequency, intensity, and duration of the anxiety so that daily functioning is not impaired per pt self report 3 out of 5 sessions documented.   Outcome: Progressing Goal: STG: Learn and implement coping skills that result in a reduction of anxiety and worry, and improve daily functioning. 3 out of 5 sessions documented Outcome: Progressing Intervention: Assist with relaxation techniques, as appropriate (deep breathing exercises, meditation, guided imagery) Note: Reviewed  Intervention: Encourage patient to identify triggers Note: Assisted w/ identification   ProgressTowards Goals: Progressing  Interventions: CBT and Other: trauma focused  Summary: [name] is a 66 y.o. female who presents with improving symptoms related to depression and anxiety diagnoses. Pt reports  continuing good days and fewer bad days.  Pt feels she is managing situational stressors good.   Allowed pt to explore and express thoughts and feelings associated with recent life situations and external stressors. Doneshia reports that she is experiencing stress associated with holiday gatherings, and "going overboard" with hosting, gifts, Judithe Modest. Patient reports that she enjoys spending time with family, and is looking forward to her daughter coming to visit from [place]. Patient reports she has another daughter that lives in [place] that she would love to see around the holidays. Patient reports that she is the one that primarily does everything that has to do with the holidays from decorating to cooking to buying presents--patient reports that her husband does not get very involved. Patient reports that this does trigger some frustration, but that she's used to it. Patient reports that the pros outweigh the cons as far as the activities that she does to get ready for the holidays.  Patient reports that she is concerned about some health issues--ongoing thyroid issues, and autoimmune concerns. Patient reports that she initially thought she had some things going on with her heart, but has been cleared by the cardiologist.  Reviewed importance of self-care and life balance during this time of stress. Allowed patient to identify activities that help her feel less stressed. Encouraged patient to continue engaging in these activities, including social engagements.  Continued recommendations are as follows: self care behaviors, positive social engagements, focusing on overall work/home/life balance, and focusing on positive physical and emotional wellness.   Suicidal/Homicidal: No  Therapist Response: Pt is continuing to apply interventions learned in session into daily life situations. Pt is currently on track to meet goals utilizing interventions mentioned above. Personal growth and progress  noted. Treatment to continue as indicated.   Plan: Return again in 4 weeks.  Diagnosis:  Encounter Diagnoses  Name Primary?   MDD (major depressive disorder),  recurrent, in partial remission ([place]) Yes   GAD (generalized anxiety disorder)     Collaboration of Care: Other Pt encouraged to continue care with psychiatrist of record, Dr. Ursula Alert  Patient/Guardian was advised Release of Information must be obtained prior to any record release in order to collaborate their care with an outside provider. Patient/Guardian was advised if they have not already done so to contact the registration department to sign all necessary forms in order for [place] to release information regarding their care.   Consent: Patient/Guardian gives verbal consent for treatment and assignment of benefits for services provided during this visit. Patient/Guardian expressed understanding and agreed to proceed.   [place], LCSW 01/18/2022

## 2022-01-18 NOTE — Plan of Care (Signed)
  Problem: Depression Goal: LTG: Decrease depressive symptoms and improve levels of effective functioning-pt reports a decrease in overall depression symptoms 3 out of 5 sessions documented.  Outcome: Not Progressing Goal: STG: Develop healthy thinking patterns and beliefs about self, others, and the world that lead to the alleviation and help prevent the relapse of depression. 3 out of 5 sessions documented Outcome: Not Progressing Intervention: Encourage verbalization of feelings/concerns/expectations Note: Allowed pt to explore/express Intervention: Encourage self-care activities Note: Reviewed  Intervention: REVIEW PLEASE SKILLS (TREAT PHYSICAL ILLNESS, BALANCE EATING, AVOID MOOD-ALTERING SUBSTANCES, BALANCE SLEEP AND GET EXERCISE) WITH [place] Note: Reviewed--pt focusing on balanced eating   Problem: Anxiety Disorder Goal: LTG: Reduce overall frequency, intensity, and duration of the anxiety so that daily functioning is not impaired per pt self report 3 out of 5 sessions documented.   Outcome: Not Progressing Goal: STG: Learn and implement coping skills that result in a reduction of anxiety and worry, and improve daily functioning. 3 out of 5 sessions documented Outcome: Not Progressing Intervention: Assist with relaxation techniques, as appropriate (deep breathing exercises, meditation, guided imagery) Note: Reviewed  Intervention: Encourage patient to identify triggers Note: Assisted w/ identification

## 2022-01-18 NOTE — Plan of Care (Signed)
  Problem: Depression Goal: LTG: Decrease depressive symptoms and improve levels of effective functioning-pt reports a decrease in overall depression symptoms 3 out of 5 sessions documented.  Outcome: Progressing Goal: STG: Develop healthy thinking patterns and beliefs about self, others, and the world that lead to the alleviation and help prevent the relapse of depression. 3 out of 5 sessions documented Outcome: Progressing Intervention: Encourage verbalization of feelings/concerns/expectations Note: Allowed pt to explore/express Intervention: Encourage self-care activities Note: Reviewed  Intervention: REVIEW PLEASE SKILLS (TREAT PHYSICAL ILLNESS, BALANCE EATING, AVOID MOOD-ALTERING SUBSTANCES, BALANCE SLEEP AND GET EXERCISE) WITH [place]ani Note: Reviewed--pt focusing on balanced eating   Problem: Anxiety Disorder Goal: LTG: Reduce overall frequency, intensity, and duration of the anxiety so that daily functioning is not impaired per pt self report 3 out of 5 sessions documented.   Outcome: Progressing Goal: STG: Learn and implement coping skills that result in a reduction of anxiety and worry, and improve daily functioning. 3 out of 5 sessions documented Outcome: Progressing Intervention: Assist with relaxation techniques, as appropriate (deep breathing exercises, meditation, guided imagery) Note: Reviewed  Intervention: Encourage patient to identify triggers Note: Assisted w/ identification

## 2022-01-25 DIAGNOSIS — I10 Essential (primary) hypertension: Secondary | ICD-10-CM | POA: Diagnosis not present

## 2022-01-25 DIAGNOSIS — E782 Mixed hyperlipidemia: Secondary | ICD-10-CM | POA: Diagnosis not present

## 2022-02-02 ENCOUNTER — Ambulatory Visit
Admission: RE | Admit: 2022-02-02 | Discharge: 2022-02-02 | Disposition: A | Discharge status: Home / Self Care | Payer: Medicare HMO | Source: Ambulatory Visit | Attending: Internal Medicine | Admitting: Internal Medicine

## 2022-02-02 DIAGNOSIS — Z1231 Encounter for screening mammogram for malignant neoplasm of breast: Secondary | ICD-10-CM | POA: Diagnosis not present

## 2022-03-22 ENCOUNTER — Other Ambulatory Visit: Payer: Self-pay | Admitting: Psychiatry

## 2022-03-22 DIAGNOSIS — F411 Generalized anxiety disorder: Secondary | ICD-10-CM

## 2022-03-22 DIAGNOSIS — F3342 Major depressive disorder, recurrent, in full remission: Secondary | ICD-10-CM

## 2022-03-27 ENCOUNTER — Other Ambulatory Visit: Payer: Self-pay | Admitting: Psychiatry

## 2022-03-27 DIAGNOSIS — F3341 Major depressive disorder, recurrent, in partial remission: Secondary | ICD-10-CM

## 2022-03-30 ENCOUNTER — Ambulatory Visit: Payer: Medicare HMO | Admitting: Psychiatry

## 2022-03-30 ENCOUNTER — Encounter: Payer: Self-pay | Admitting: Psychiatry

## 2022-03-30 VITALS — BP 129/75 | HR 82 | Temp 98.3°F | Ht 62.0 in | Wt 176.6 lb

## 2022-03-30 DIAGNOSIS — R69 Illness, unspecified: Secondary | ICD-10-CM | POA: Diagnosis not present

## 2022-03-30 DIAGNOSIS — F3342 Major depressive disorder, recurrent, in full remission: Secondary | ICD-10-CM

## 2022-03-30 DIAGNOSIS — F411 Generalized anxiety disorder: Secondary | ICD-10-CM

## 2022-03-30 MED ORDER — BUPROPION HCL 75 MG PO TABS: 75.0000 mg | ORAL_TABLET | Freq: Every day | ORAL | 1 refills | Status: DC

## 2022-03-30 NOTE — Progress Notes (Signed)
[facility] MD OP Progress Note  03/30/2022 11:33 AM Shannon Crawford  MRN:  [id number]  Chief Complaint:  Chief Complaint  Patient presents with   Follow-up   Depression   Anxiety   Medication Refill   HPI: Shannon Crawford is a 67 year old biracial female who has a history of GAD, MDD, chronic pain syndrome, vitiligo, hypertension, hypothyroidism, hyperlipidemia was evaluated in office today.  Patient today reports she is currently doing well with regards to her mood.  Continues to do quilting, looks forward to an event that is coming up beginning of March.  Patient reports overall sleep is good.  Uses the zolpidem only as needed.  Continues to exercise, does Pilate.  Reports she is trying to lose weight.  Patient reports appetite is fair.  Patient denies any suicidality, homicidality or perceptual disturbances.  Patient denies any side effects to medications continues to be compliant on the Wellbutrin and Cymbalta.  Denies any other concerns today.  Visit Diagnosis:    ICD-10-CM   1. GAD (generalized anxiety disorder)  F41.1     2. MDD (major depressive disorder), recurrent, in full remission ([place])  F33.42 buPROPion (WELLBUTRIN) 75 MG tablet      Past Psychiatric History: Reviewed past psychiatric history from progress note on 03/29/2021.  Past trials of medications like Lexapro, Zoloft, duloxetine, zolpidem, Rexulti, Xanax, Topamax.  Past Medical History:  Past Medical History:  Diagnosis Date   Anxiety    Fracture    ankle   Heart murmur    Hypothyroid    Thyroid disease     Past Surgical History:  Procedure Laterality Date   ABDOMINAL SURGERY     tummy tuck   BREAST BIOPSY Left 10+ years   Negative- core bx    Family Psychiatric History: Reviewed family psychiatric history from progress note on 03/29/2021.  Family History:  Family History  Problem Relation Age of Onset   Hypertension Mother    Alcohol abuse Father    Anxiety disorder Sister    Prostate cancer Brother     Hypertension Sister    Breast cancer Sister 25   Breast cancer Cousin     Social History: Reviewed social history from progress note on 03/29/2021. Social History   Socioeconomic History   Marital status: Married    Spouse name: John   Number of children: 2   Years of education: Not on file   Highest education level: Some college, no degree  Occupational History   Not on file  Tobacco Use   Smoking status: Never   Smokeless tobacco: Never  Vaping Use   Vaping Use: Never used  Substance and Sexual Activity   Alcohol use: Yes    Alcohol/week: 12.0 standard drinks of alcohol    Types: 12 Cans of beer per week   Drug use: No   Sexual activity: Yes  Other Topics Concern   Not on file  Social History Narrative   Not on file   Social Determinants of Health   Financial Resource Strain: Not on file  Food Insecurity: Not on file  Transportation Needs: Not on file  Physical Activity: Not on file  Stress: Not on file  Social Connections: Not on file    Allergies:  Allergies  Allergen Reactions   Catfish [Fish Allergy] Nausea And Vomiting    Metabolic Disorder Labs: No results found for: "HGBA1C", "MPG" No results found for: "PROLACTIN" No results found for: "CHOL", "TRIG", "HDL", "CHOLHDL", "VLDL", "LDLCALC" No results found for: "  TSH"  Therapeutic Level Labs: No results found for: "LITHIUM" No results found for: "VALPROATE" No results found for: "CBMZ"  Current Medications: Current Outpatient Medications  Medication Sig Dispense Refill   B Complex-C (SUPER B COMPLEX PO) Take by mouth.     bimatoprost (LATISSE) 0.03 % ophthalmic solution Place 1 application  into both eyes at bedtime. Place one drop on applicator and apply evenly along the skin of the upper eyelid at base of eyelashes once daily at bedtime; repeat procedure for second eye (use a clean applicator). 3 mL 12   Cholecalciferol (D-3-5) 125 MCG (5000 UT) capsule Take 5,000 Units by mouth daily.      DULoxetine (CYMBALTA) 20 MG capsule TAKE 1 CAPSULE BY MOUTH DAILY .STOP '30MG'$  90 capsule 0   levothyroxine (SYNTHROID) 88 MCG tablet Take 88 mcg by mouth daily before breakfast.     losartan-hydrochlorothiazide (HYZAAR) 100-25 MG tablet Take 1 tablet by mouth daily.     NON FORMULARY 50 mg. STUDY [id number] RITLECITINIB 50 MG/PLACEBO CAPSULE     rosuvastatin (CRESTOR) 20 MG tablet 20 mg daily.     zolpidem (AMBIEN) 5 MG tablet TAKE ONE TABLET BY MOUTH EVERY NIGHT AT BEDTIME AS NEEDED FOR SLEEP 30 tablet 3   buPROPion (WELLBUTRIN) 75 MG tablet Take 1 tablet (75 mg total) by mouth daily with breakfast. 90 tablet 1   No current facility-administered medications for this visit.     Musculoskeletal: Strength & Muscle Tone: within normal limits Gait & Station: normal Patient leans: N/A  Psychiatric Specialty Exam: Review of Systems  Psychiatric/Behavioral: Negative.    All other systems reviewed and are negative.   Blood pressure 129/75, pulse 82, temperature 98.3 F (36.8 C), temperature source Skin, height '5\' 2"'$  (1.575 m), weight 176 lb 9.6 oz (80.1 kg).Body mass index is 32.3 kg/m.  General Appearance: Casual  Eye Contact:  Fair  Speech:  Clear and Coherent  Volume:  Normal  Mood:  Euthymic  Affect:  Congruent  Thought Process:  Goal Directed and Descriptions of Associations: Intact  Orientation:  Full (Time, Place, and Person)  Thought Content: Logical   Suicidal Thoughts:  No  Homicidal Thoughts:  No  Memory:  Immediate;   Fair Recent;   Fair Remote;   Fair  Judgement:  Fair  Insight:  Good  Psychomotor Activity:  Normal  Concentration:  Concentration: Fair and Attention Span: Fair  Recall:  [employer] of Knowledge: Fair  Language: Fair  Akathisia:  No  Handed:  Right  AIMS (if indicated): not done  Assets:  Communication Skills Desire for Improvement Housing Social Support  ADL's:  Intact  Cognition: WNL  Sleep:  Fair   Screenings: [place]  Visit from 09/29/2021 in [facility] Office Visit from 07/14/2021 in [facility] Total Score 0 0      AUDIT    [place] Office Visit from 03/29/2021 in [facility]  Alcohol Use Disorder Identification Test Final Score (AUDIT) 13      GAD-7    [place] Office Visit from 03/30/2022 in [facility] Office Visit from 12/28/2021 in [facility] Office Visit from 09/29/2021 in [facility] Office Visit from 06/15/2021 in [facility] Counselor from 05/12/2021 in [facility]  Total GAD-7 Score 3  $'1 4 8 8      'P$ PHQ2-9    [place] Office Visit from 03/30/2022 in [facility] Office Visit from 12/28/2021 in [facility] Office Visit from 09/29/2021 in [facility] Office Visit from 07/14/2021 in [facility] Office Visit from 06/15/2021 in [facility]  PHQ-2 Total Score '1 1 2 2 4  '$ PHQ-9 Total Score '4 5 6 11 14      '$ [place] Office Visit from 03/30/2022 in [facility] Office Visit from 12/28/2021 in [facility] Counselor from 10/20/2021 in [facility] at [facility] No Risk No Risk No Risk        Assessment and Plan: Shannon Crawford is a 67 year old female, married, lives in [place], has a history of MDD, hypothyroidism, chronic pain syndrome, hypertension, hyperlipidemia was evaluated in office today.  Patient is currently stable.  Plan as  noted below.  Plan  GAD-stable Cymbalta 20 mg p.o. daily-reduced dosage Wellbutrin 75 mg p.o. daily in the morning Continue CBT as needed  MDD in remission Wellbutrin 75 mg p.o. daily Zolpidem 5 mg p.o. nightly as needed Continue CBT as needed  Follow-up in clinic in 5 months or sooner if needed. This note was generated in part or whole with voice recognition software. Voice recognition is usually quite accurate but there are transcription errors that can and very often do occur. I apologize for any typographical errors that were not detected and corrected.     Ursula Alert, MD 03/30/2022, 11:33 AM

## 2022-04-06 ENCOUNTER — Ambulatory Visit (INDEPENDENT_AMBULATORY_CARE_PROVIDER_SITE_OTHER): Payer: Medicare HMO | Admitting: Licensed Clinical Social Worker

## 2022-04-06 DIAGNOSIS — R69 Illness, unspecified: Secondary | ICD-10-CM | POA: Diagnosis not present

## 2022-04-06 DIAGNOSIS — Z789 Other specified health status: Secondary | ICD-10-CM | POA: Diagnosis not present

## 2022-04-06 DIAGNOSIS — F3341 Major depressive disorder, recurrent, in partial remission: Secondary | ICD-10-CM | POA: Diagnosis not present

## 2022-04-06 DIAGNOSIS — F411 Generalized anxiety disorder: Secondary | ICD-10-CM

## 2022-04-06 NOTE — Progress Notes (Addendum)
THERAPIST PROGRESS NOTE  Session Time: [place] in office visit for patient and LCSW clinician  Participation Level: Active  Behavioral Response: NeatAlertAnxious  Type of Therapy: Individual Therapy  Treatment Goals addressed: Develop healthy thinking patterns and beliefs about self, others, and the world that lead to the alleviation and help prevent the relapse of depression. 3 out of 5 sessions documented  Decrease depressive symptoms and improve levels of effective functioning-pt reports a decrease in overall depression symptoms 3 out of 5 sessions documented    ProgressTowards Goals: Progressing  Interventions: CBT and Other: trauma focused  Summary: Shannon Crawford is a 67 y.o. female who presents with improving symptoms related to depression and anxiety diagnoses. Pt reports continuing good days and fewer bad days.  Pt feels she is managing situational stressors good.  Patient reports that she is using blue blocker glasses at night, and feel that the glasses are a contributing factor of recent improved quality sleep.  Patient reports that she continues to exercise 3 times a week, and this makes her feel very good about herself.  Patient reports that she was doing the keto diet, but has been off of that diet since the holidays.  Patient continues to enjoy being a part of the [place] community, and is currently working on several quilts, and is entering a quilts into a quilt show in about 1 month.  Discussed drinking behaviors--patient reports that she had 5 months of sobriety prior to Thanksgiving, then relapsed.  Patient reports that she is not drinking every day, but on the days that she does drink she will binge drink.  Patient reports if she was drinking wine, she will not stop until she has drank the whole bottle of wine.  Patient states that she was drinking beer in the past, but is now drinking wine.  Patient states that her drinking has  upset her husband, who feels that she is drinking too much.  Patient reports that she is motivated to change, but is scared of taking the next step (CDIOP or speaking to someone else about her substance use).  Completed AUDIT assessment with score of 15.  Reviewed score with patient and allowed her to recognize that it is right on the edge of moderate risk and high risk drinking.  Patient states that she will think about it, and will call back if she would like to speak to someone about CDIOP.  Patient states that she is not interested in [place] groups.  Continued recommendations are as follows: self care behaviors, positive social engagements, focusing on overall work/home/life balance, and focusing on positive physical and emotional wellness.   Suicidal/Homicidal: No  Therapist Response: Pt is continuing to apply interventions learned in session into daily life situations. Pt is currently on track to meet goals utilizing interventions mentioned above. Personal growth and progress noted. Treatment to continue as indicated.   Plan: Return again in 4 weeks.  Diagnosis:  Encounter Diagnoses  Name Primary?   MDD (major depressive disorder), recurrent, in partial remission (Shannon Crawford) Yes   GAD (generalized anxiety disorder)    Significant use of alcohol    Collaboration of Care: Other Pt encouraged to continue care with psychiatrist of record, Dr. Ursula Alert  Patient/Guardian was advised Release of Information must be obtained prior to any record release in order to collaborate their care with an outside provider. Patient/Guardian was advised if they have not already done so to contact the registration department to sign all  necessary forms in order for [place] to release information regarding their care.   Consent: Patient/Guardian gives verbal consent for treatment and assignment of benefits for services provided during this visit. Patient/Guardian expressed understanding and agreed to proceed.    [place], LCSW 04/06/2022

## 2022-04-07 DIAGNOSIS — R69 Illness, unspecified: Secondary | ICD-10-CM | POA: Diagnosis not present

## 2022-04-10 ENCOUNTER — Telehealth (HOSPITAL_COMMUNITY): Payer: Self-pay | Admitting: Licensed Clinical Social Worker

## 2022-04-10 NOTE — Telephone Encounter (Signed)
The therapist attempts to reach Shannon Crawford as she apparently called the Reception desk requesting a call back concerning the CD IOP treatment program. The therapist also receives a message from her primary therapist asking that this therapist reach out to her as well.  The therapist leaves a HIPAA-compliant voicemail with his direct contact number.  Adam Phenix, [place], LCSW, [facility], [place] 04/10/2022

## 2022-04-25 ENCOUNTER — Other Ambulatory Visit: Payer: Self-pay | Admitting: Internal Medicine

## 2022-04-25 DIAGNOSIS — E669 Obesity, unspecified: Secondary | ICD-10-CM

## 2022-05-04 ENCOUNTER — Ambulatory Visit: Payer: Medicare HMO | Admitting: Dermatology

## 2022-05-04 ENCOUNTER — Ambulatory Visit (HOSPITAL_COMMUNITY): Payer: Medicare HMO | Admitting: Licensed Clinical Social Worker

## 2022-05-04 ENCOUNTER — Telehealth (HOSPITAL_COMMUNITY): Payer: Self-pay | Admitting: Licensed Clinical Social Worker

## 2022-05-04 DIAGNOSIS — Z789 Other specified health status: Secondary | ICD-10-CM

## 2022-05-04 DIAGNOSIS — F3341 Major depressive disorder, recurrent, in partial remission: Secondary | ICD-10-CM

## 2022-05-04 DIAGNOSIS — R69 Illness, unspecified: Secondary | ICD-10-CM | POA: Diagnosis not present

## 2022-05-04 NOTE — Progress Notes (Signed)
THERAPIST PROGRESS NOTE  Session Time: 9:15-10a  [place] in office visit for patient and LCSW clinician  Participation Level: Active  Behavioral Response: NeatAlertAnxious  Type of Therapy: Individual Therapy  Treatment Goals addressed: Develop healthy thinking patterns and beliefs about self, others, and the world that lead to the alleviation and help prevent the relapse of depression. 3 out of 5 sessions documented  Decrease depressive symptoms and improve levels of effective functioning-pt reports a decrease in overall depression symptoms 3 out of 5 sessions documented    ProgressTowards Goals: Progressing  Interventions: Motivational Interviewing, Solution Focused, and Other: trauma focused  Summary: Shannon Crawford is a 67 y.o. female who presents with improving symptoms related to depression and anxiety diagnoses.  Patient reports that her overall mood is stable and that she is managing situational stressors well.  Patient reports that she has fair quality and quantity of sleep.  Patient reports that she is falling asleep later, and waking up in the middle of the night.  Patient also reports that she is experiencing nightmares.  Allowed pt to explore and express thoughts and feelings associated with recent life situations and external stressors.  Patient reports that recently her primary focus has been on a quilt show, which patient won several awards.  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  Explored patient's relationship with husband, and patient's relationship with alcohol.  Patient made the statement that she does not feel that she is drinking too much, but her husband really wants her to stop.  Patient made the remark that she has stopped drinking at times, and has the ability to stop drinking for periods of time.  Patient reports that  she does eventually go back to drinking, and admits that she is unable to stop for an extended period of time.  Patient was referred to CD IOP at her last session, was called by the therapist, but denies the phone call happened.  Patient was referred to [place] clinician, and patient was given information about the program through [facility] health.  Discussed stages of change--patient feels that she is an active stage of change.  Clinician had an opportunity to discuss case with CDIOP clinician, and due to patient making the statement that she does not have an issue with alcohol, she is only doing this for her husband than she is currently in the precontemplative stage of change.  Continued recommendations are as follows: self care behaviors, positive social engagements, focusing on overall work/home/life balance, and focusing on positive physical and emotional wellness.   Suicidal/Homicidal: No  Therapist Response: Pt is continuing to apply interventions learned in session into daily life situations. Pt is currently on track to meet goals utilizing interventions mentioned above. Personal growth and progress noted. Treatment to continue as indicated.   Plan: Return again in 4 weeks.  Diagnosis:  Encounter Diagnoses  Name Primary?   MDD (major depressive disorder), recurrent, in partial remission ([place]) Yes   Significant use of alcohol    Collaboration of Care: Other Pt encouraged to continue care with psychiatrist of record, Dr. Ursula Alert  Patient/Guardian was advised Release of Information must be obtained prior to any record release in order to collaborate their care with an outside provider. Patient/Guardian was advised if they have not already done so to contact the registration department to sign all necessary forms in order  for [place] to release information regarding their care.   Consent: Patient/Guardian gives verbal consent for treatment and assignment of benefits for services provided  during this visit. Patient/Guardian expressed understanding and agreed to proceed.   [place], LCSW 05/04/2022

## 2022-05-04 NOTE — Telephone Encounter (Signed)
The therapist calls Shannon Crawford verifying identity via two identifiers. As she says that her therapist did not tell her the hours for the CD IOP, the therapist gives her an overview of the program.  She says that she cannot attend during these hours as the conflict with her exercise class that meets Mondays, Wednesdays, and Fridays from 9 to 10:30 a.m. so asks if there is an evening program. The therapist makes her aware of the [place] but recommends that she call [employer] and speaks to a Health Concierge to get a list of in-network, afternoon or evening CD IOPs closer to where she lives if that is possible.   [name] says that she will do this.  Adam Phenix, [place], LCSW, [facility], [place] 05/04/2022

## 2022-05-09 ENCOUNTER — Ambulatory Visit: Payer: Medicare HMO | Admitting: Dermatology

## 2022-05-09 DIAGNOSIS — H524 Presbyopia: Secondary | ICD-10-CM | POA: Diagnosis not present

## 2022-05-09 DIAGNOSIS — Z0001 Encounter for general adult medical examination with abnormal findings: Secondary | ICD-10-CM | POA: Diagnosis not present

## 2022-05-11 ENCOUNTER — Encounter: Payer: Self-pay | Admitting: Internal Medicine

## 2022-05-11 ENCOUNTER — Ambulatory Visit (INDEPENDENT_AMBULATORY_CARE_PROVIDER_SITE_OTHER): Payer: Medicare HMO | Admitting: Internal Medicine

## 2022-05-11 VITALS — BP 124/80 | HR 71 | Ht 65.0 in | Wt 178.0 lb

## 2022-05-11 DIAGNOSIS — E039 Hypothyroidism, unspecified: Secondary | ICD-10-CM | POA: Diagnosis not present

## 2022-05-11 DIAGNOSIS — F411 Generalized anxiety disorder: Secondary | ICD-10-CM

## 2022-05-11 DIAGNOSIS — E782 Mixed hyperlipidemia: Secondary | ICD-10-CM | POA: Diagnosis not present

## 2022-05-11 DIAGNOSIS — E6609 Other obesity due to excess calories: Secondary | ICD-10-CM | POA: Diagnosis not present

## 2022-05-11 DIAGNOSIS — Z1211 Encounter for screening for malignant neoplasm of colon: Secondary | ICD-10-CM | POA: Diagnosis not present

## 2022-05-11 DIAGNOSIS — R7302 Impaired glucose tolerance (oral): Secondary | ICD-10-CM | POA: Insufficient documentation

## 2022-05-11 DIAGNOSIS — I10 Essential (primary) hypertension: Secondary | ICD-10-CM | POA: Diagnosis not present

## 2022-05-11 DIAGNOSIS — Z13820 Encounter for screening for osteoporosis: Secondary | ICD-10-CM | POA: Diagnosis not present

## 2022-05-11 DIAGNOSIS — L80 Vitiligo: Secondary | ICD-10-CM | POA: Insufficient documentation

## 2022-05-11 DIAGNOSIS — G4733 Obstructive sleep apnea (adult) (pediatric): Secondary | ICD-10-CM | POA: Diagnosis not present

## 2022-05-11 DIAGNOSIS — R69 Illness, unspecified: Secondary | ICD-10-CM | POA: Diagnosis not present

## 2022-05-11 MED ORDER — PHENTERMINE HCL 37.5 MG PO CAPS: 37.5000 mg | ORAL_CAPSULE | ORAL | 2 refills | Status: DC

## 2022-05-11 NOTE — Progress Notes (Signed)
Established Patient Office Visit  Subjective:  Patient ID: Shannon Crawford, female    DOB: 07-22-1955  Age: 67 y.o. MRN: [id number]  Chief Complaint  Patient presents with   Follow-up    4 month follow up    Patient comes in for her follow-up today.  She is generally feeling quite well.  However she has some complaints of her left shoulder discomfort and some limited range of motion.  Thinks she slept wrong on it.  Patient advised to start taking her meloxicam every day for next 2 weeks.  Encouraged to do some range of motion exercises. She also notes a small cyst on her left middle finger on the lateral side it does not look like a wart but could be so we will get evaluated by her dermatologist. She is due for her bone density test as well as Cologuard.  Will set up orders for both.  Patient will return fasting for her blood work. Patient thinks that her Bontril SR tablets are not helping to control her appetite, she is having trouble losing weight now although she is quite active and controls her diet.  Would like to try Adipex-P 37.5 mg. Will send in rx. Patient is still enrolled in the experimental drug study at [facility] for her vitiligo. She also continues to have her chronic lower back pain with sciatica but she is managing it quite well.    No other concerns at this time.   Past Medical History:  Diagnosis Date   Anxiety    Fracture    ankle   Heart murmur    Hypothyroid    Impaired glucose tolerance    Mixed hyperlipidemia    OSA on CPAP    Primary vitiligo    Thyroid disease     Past Surgical History:  Procedure Laterality Date   ABDOMINAL SURGERY     tummy tuck   BREAST BIOPSY Left 10+ years   Negative- core bx    Social History   Socioeconomic History   Marital status: Married    Spouse name: John   Number of children: 2   Years of education: Not on file   Highest education level: Some college, no degree  Occupational History   Not on file   Tobacco Use   Smoking status: Never   Smokeless tobacco: Never  Vaping Use   Vaping Use: Never used  Substance and Sexual Activity   Alcohol use: Yes    Alcohol/week: 12.0 standard drinks of alcohol    Types: 12 Cans of beer per week   Drug use: No   Sexual activity: Yes  Other Topics Concern   Not on file  Social History Narrative   Not on file   Social Determinants of Health   Financial Resource Strain: Not on file  Food Insecurity: Not on file  Transportation Needs: Not on file  Physical Activity: Not on file  Stress: Not on file  Social Connections: Not on file  Intimate Partner Violence: Not on file    Family History  Problem Relation Age of Onset   Hypertension Mother    Alcohol abuse Father    Anxiety disorder Sister    Prostate cancer Brother    Hypertension Sister    Breast cancer Sister 71   Breast cancer Cousin     Allergies  Allergen Reactions   Catfish [Fish Allergy] Nausea And Vomiting    Review of Systems  Constitutional:  Negative for chills, diaphoresis,  fever, malaise/fatigue and weight loss.  HENT: Negative.    Eyes: Negative.   Respiratory: Negative.    Cardiovascular: Negative.   Gastrointestinal: Negative.   Genitourinary: Negative.   Musculoskeletal:  Positive for joint pain and myalgias.  Neurological: Negative.   Psychiatric/Behavioral: Negative.         Objective:   BP 124/80   Pulse 71   Ht 5\' 5"  (1.651 m)   Wt 178 lb (80.7 kg)   SpO2 98%   BMI 29.62 kg/m   Vitals:   05/11/22 1306  BP: 124/80  Pulse: 71  Height: 5\' 5"  (1.651 m)  Weight: 178 lb (80.7 kg)  SpO2: 98%  BMI (Calculated): 29.62    Physical Exam Vitals and nursing note reviewed.  Constitutional:      Appearance: Normal appearance. She is obese.  HENT:     Head: Normocephalic.     Right Ear: Tympanic membrane normal.     Left Ear: Tympanic membrane normal.  Cardiovascular:     Rate and Rhythm: Normal rate and regular rhythm.     Pulses:  Normal pulses.     Heart sounds: Normal heart sounds.  Pulmonary:     Effort: Pulmonary effort is normal.     Breath sounds: Normal breath sounds. No rhonchi or rales.  Abdominal:     General: Abdomen is flat. Bowel sounds are normal.     Palpations: Abdomen is soft.  Musculoskeletal:        General: Normal range of motion.     Cervical back: Normal range of motion and neck supple. Tenderness present.  Skin:    General: Skin is warm.  Neurological:     General: No focal deficit present.     Mental Status: She is alert and oriented to person, place, and time. Mental status is at baseline.  Psychiatric:        Mood and Affect: Mood normal.        Behavior: Behavior normal.      No results found for any visits on 05/11/22.  No results found for this or any previous visit (from the past 2160 hour(s)).    Assessment & Plan:  Patient encouraged to continue taking all her medications.  She will be set up for bone density and Cologuard.  Patient will return fasting for blood work.  She will try taking phentermine and see if there is adequate weight loss. Problem List Items Addressed This Visit     GAD (generalized anxiety disorder)   Hypothyroidism   Relevant Orders   TSH+T4F+T3Free   Essential hypertension, benign   Impaired glucose tolerance   Relevant Orders   Hemoglobin A1c   Mixed hyperlipidemia - Primary   Relevant Orders   CMP14+EGFR   Lipid Panel w/o Chol/HDL Ratio   OSA on CPAP   Primary vitiligo   Relevant Orders   CBC With Differential   Class 1 obesity due to excess calories without serious comorbidity in adult   Relevant Medications   phentermine 37.5 MG capsule   Other Visit Diagnoses     Screening for colon cancer       Relevant Orders   Cologuard   Screening for osteoporosis       Relevant Orders   DG Bone Density       Return in about 4 months (around 09/10/2022).   Total time spent: 30 minutes  Margaretann Loveless, MD  05/11/2022

## 2022-05-24 DIAGNOSIS — Z1211 Encounter for screening for malignant neoplasm of colon: Secondary | ICD-10-CM | POA: Diagnosis not present

## 2022-05-31 LAB — COLOGUARD: COLOGUARD: NEGATIVE

## 2022-06-04 ENCOUNTER — Encounter: Payer: Self-pay | Admitting: Family

## 2022-06-08 ENCOUNTER — Ambulatory Visit (HOSPITAL_COMMUNITY): Payer: Medicare HMO | Admitting: Licensed Clinical Social Worker

## 2022-06-21 ENCOUNTER — Other Ambulatory Visit: Payer: Self-pay | Admitting: Psychiatry

## 2022-06-21 DIAGNOSIS — F411 Generalized anxiety disorder: Secondary | ICD-10-CM

## 2022-06-21 DIAGNOSIS — F3342 Major depressive disorder, recurrent, in full remission: Secondary | ICD-10-CM

## 2022-07-06 ENCOUNTER — Other Ambulatory Visit: Payer: Self-pay | Admitting: Internal Medicine

## 2022-07-06 DIAGNOSIS — E038 Other specified hypothyroidism: Secondary | ICD-10-CM

## 2022-08-03 ENCOUNTER — Other Ambulatory Visit: Payer: Self-pay | Admitting: Internal Medicine

## 2022-08-03 DIAGNOSIS — I10 Essential (primary) hypertension: Secondary | ICD-10-CM

## 2022-08-16 DIAGNOSIS — M79632 Pain in left forearm: Secondary | ICD-10-CM | POA: Diagnosis not present

## 2022-08-16 DIAGNOSIS — M25512 Pain in left shoulder: Secondary | ICD-10-CM | POA: Diagnosis not present

## 2022-08-23 DIAGNOSIS — M25512 Pain in left shoulder: Secondary | ICD-10-CM | POA: Diagnosis not present

## 2022-08-31 ENCOUNTER — Encounter: Payer: Self-pay | Admitting: Psychiatry

## 2022-08-31 ENCOUNTER — Ambulatory Visit: Payer: Medicare HMO | Admitting: Psychiatry

## 2022-08-31 ENCOUNTER — Telehealth (HOSPITAL_COMMUNITY): Payer: Self-pay | Admitting: Psychiatry

## 2022-08-31 ENCOUNTER — Ambulatory Visit (INDEPENDENT_AMBULATORY_CARE_PROVIDER_SITE_OTHER): Payer: Medicare HMO | Admitting: Licensed Clinical Social Worker

## 2022-08-31 VITALS — BP 129/80 | HR 81 | Temp 97.0°F | Ht 65.0 in | Wt 191.4 lb

## 2022-08-31 DIAGNOSIS — Z789 Other specified health status: Secondary | ICD-10-CM

## 2022-08-31 DIAGNOSIS — F411 Generalized anxiety disorder: Secondary | ICD-10-CM | POA: Diagnosis not present

## 2022-08-31 DIAGNOSIS — F3341 Major depressive disorder, recurrent, in partial remission: Secondary | ICD-10-CM

## 2022-08-31 DIAGNOSIS — F332 Major depressive disorder, recurrent severe without psychotic features: Secondary | ICD-10-CM

## 2022-08-31 MED ORDER — TOPIRAMATE 25 MG PO TABS
25.0000 mg | ORAL_TABLET | Freq: Every day | ORAL | 1 refills | Status: DC
Start: 2022-08-31 — End: 2022-10-19

## 2022-08-31 MED ORDER — DULOXETINE HCL 20 MG PO CPEP: 40.0000 mg | ORAL_CAPSULE | Freq: Every day | ORAL | 0 refills | Status: DC

## 2022-08-31 MED ORDER — BUPROPION HCL 75 MG PO TABS: 75.0000 mg | ORAL_TABLET | Freq: Every day | ORAL | 0 refills | Status: DC

## 2022-08-31 NOTE — Patient Instructions (Signed)
Topiramate Tablets What is this medication? TOPIRAMATE (toe PYRE a mate) prevents and controls seizures in people with epilepsy. It may also be used to prevent migraine headaches. It works by calming overactive nerves in your body. This medicine may be used for other purposes; ask your health care provider or pharmacist if you have questions. COMMON BRAND NAME(S): Topamax, Topiragen What should I tell my care team before I take this medication? They need to know if you have any of these conditions: Bleeding disorder Kidney disease Lung disease Suicidal thoughts, plans, or attempt by you or a family member An unusual or allergic reaction to topiramate, other medications, foods, dyes, or preservatives Pregnant or trying to get pregnant Breast-feeding How should I use this medication? Take this medication by mouth with water. Take it as directed on the prescription label at the same time every day. Do not cut, crush or chew this medicine. Swallow the tablets whole. You can take it with or without food. If it upsets your stomach, take it with food. Keep taking it unless your care team tells you to stop. A special MedGuide will be given to you by the pharmacist with each prescription and refill. Be sure to read this information carefully each time. Talk to your care team about the use of this medication in children. While it may be prescribed for children as young as 2 years for selected conditions, precautions do apply. Overdosage: If you think you have taken too much of this medicine contact a poison control center or emergency room at once. NOTE: This medicine is only for you. Do not share this medicine with others. What if I miss a dose? If you miss a dose, take it as soon as you can unless it is within 6 hours of the next dose. If it is within 6 hours of the next dose, skip the missed dose. Take the next dose at the normal time. Do not take double or extra doses. What may interact with this  medication? Acetazolamide Alcohol Antihistamines for allergy, cough, and cold Aspirin and aspirin-like medications Atropine Certain medications for anxiety or sleep Certain medications for bladder problems, such as oxybutynin, tolterodine Certain medications for depression, such as amitriptyline, fluoxetine, sertraline Certain medications for Parkinson disease, such as benztropine, trihexyphenidyl Certain medications for seizures, such as carbamazepine, lamotrigine, phenobarbital, phenytoin, primidone, valproic acid, zonisamide Certain medications for stomach problems, such as dicyclomine, hyoscyamine Certain medications for travel sickness, such as scopolamine Certain medications that treat or prevent blood clots, such as warfarin, enoxaparin, dalteparin, apixaban, dabigatran, rivaroxaban Digoxin Diltiazem Estrogen and progestin hormones General anesthetics, such as halothane, isoflurane, methoxyflurane, propofol Glyburide Hydrochlorothiazide Ipratropium Lithium Medications that relax muscles Metformin NSAIDs, medications for pain and inflammation, such as ibuprofen or naproxen Opioid medications for pain Phenothiazines, such as chlorpromazine, mesoridazine, prochlorperazine, thioridazine Pioglitazone This list may not describe all possible interactions. Give your health care provider a list of all the medicines, herbs, non-prescription drugs, or dietary supplements you use. Also tell them if you smoke, drink alcohol, or use illegal drugs. Some items may interact with your medicine. What should I watch for while using this medication? Visit your care team for regular checks on your progress. Tell your care team if your symptoms do not start to get better or if they get worse. Do not suddenly stop taking this medication. You may develop a severe reaction. Your care team will tell you how much medication to take. If your care team wants you to stop the medication,  the dose may be slowly  lowered over time to avoid any side effects. Wear a medical ID bracelet or chain. Carry a card that describes your condition. List the medications and doses you take on the card. This medication may affect your coordination, reaction time, or judgment. Do not drive or operate machinery until you know how this medication affects you. Sit up or stand slowly to reduce the risk of dizzy or fainting spells. Drinking alcohol with this medication can increase the risk of these side effects. This medication may cause serious skin reactions. They can happen weeks to months after starting the medication. Contact your care team right away if you notice fevers or flu-like symptoms with a rash. The rash may be red or purple and then turn into blisters or peeling of the skin. You may also notice a red rash with swelling of the face, lips, or lymph nodes in your neck or under your arms. This medication may cause thoughts of suicide or depression. This includes sudden changes in mood, behaviors, or thoughts. These changes can happen at any time but are more common in the beginning of treatment or after a change in dose. Call your care team right away if you experience these thoughts or worsening depression. This medication may slow your child's growth if it is taken for a long time at high doses. Your child's care team will monitor your child's growth. Using this medication for a long time may weaken your bones. The risk of bone fractures may be increased. Talk to your care team about your bone health. Discuss this medication with your care team if you may be pregnant. Serious birth defects can occur if you take this medication during pregnancy. There are benefits and risks to taking medications during pregnancy. Your care team can help you find the option that works for you. Contraception is recommended while taking this medication. Estrogen and progestin hormones may not work as well while you are taking this medication.  Your care team can help you find the option that works for you. Talk to your care team before breastfeeding. Changes to your treatment plan may be needed. What side effects may I notice from receiving this medication? Side effects that you should report to your care team as soon as possible: Allergic reactions--skin rash, itching, hives, swelling of the face, lips, tongue, or throat High acid level--trouble breathing, unusual weakness or fatigue, confusion, headache, fast or irregular heartbeat, nausea, vomiting High ammonia level--unusual weakness or fatigue, confusion, loss of appetite, nausea, vomiting, seizures Fever that does not go away, decrease in sweat Kidney stones--blood in the urine, pain or trouble passing urine, pain in the lower back or sides Redness, blistering, peeling or loosening of the skin, including inside the mouth Sudden eye pain or change in vision such as blurry vision, seeing halos around lights, vision loss Thoughts of suicide or self-harm, worsening mood, feelings of depression Side effects that usually do not require medical attention (report to your care team if they continue or are bothersome): Burning or tingling sensation in hands or feet Difficulty with paying attention, memory, or speech Dizziness Drowsiness Fatigue Loss of appetite with weight loss Slow or sluggish movements of the body This list may not describe all possible side effects. Call your doctor for medical advice about side effects. You may report side effects to FDA at 1-800-FDA-1088. Where should I keep my medication? Keep out of the reach of children and pets. Store between 15 and 30 degrees C (  59 and 86 degrees F). Protect from moisture. Keep the container tightly closed. Get rid of any unused medication after the expiration date. To get rid of medications that are no longer needed or have expired: Take the medication to a medication take-back program. Check with your pharmacy or law  enforcement to find a location. If you cannot return the medication, check the label or package insert to see if the medication should be thrown out in the garbage or flushed down the toilet. If you are not sure, ask your care team. If it is safe to put it in the trash, empty the medication out of the container. Mix the medication with cat litter, dirt, coffee grounds, or other unwanted substance. Seal the mixture in a bag or container. Put it in the trash. NOTE: This sheet is a summary. It may not cover all possible information. If you have questions about this medicine, talk to your doctor, pharmacist, or health care provider.  2024 Elsevier/Gold Standard (2021-06-30 00:00:00)

## 2022-08-31 NOTE — Progress Notes (Signed)
  THERAPIST PROGRESS NOTE  Session Time: 4-5p  Behavioral Health Outpatient [facility] in office visit for patient and LCSW clinician  Participation Level: Active  Behavioral Response: NeatAlertAnxious  Type of Therapy: Individual Therapy  Treatment Goals addressed: Develop healthy thinking patterns and beliefs about self, others, and the world that lead to the alleviation and help prevent the relapse of depression. 3 out of 5 sessions documented  Decrease depressive symptoms and improve levels of effective functioning-pt reports a decrease in overall depression symptoms 3 out of 5 sessions documented    ProgressTowards Goals: Progressing  Interventions: Motivational Interviewing, Solution Focused, and Other: trauma focused  Summary: Shannon Crawford is a 67 y.o. female who presents with improving symptoms related to depression and anxiety diagnoses.  Patient reports that her overall mood is stable and that she is managing situational stressors well.  Patient reports that she has fair quality and quantity of sleep.   Clinician assisted pt with identifying situations/scenarios/schemas triggering anxiety and/or depression symptoms. Allowed pt to explore and express thoughts and feelings and discussed current coping mechanisms. Reviewed changes/recommendations.   Allowed pt to explore current substance use. Pt reports that they are currently using beer on a daily basis. Pt states that she does not feel she can stop on her own. Reviewed harm reduction behavior modification versus abstaining.  Discussed IOP program eligibility criteria and assessed pt motivation to change. Pt motivated to make change.  Pt discussed [facility], and pt did not feel the program aligned with her exercise classes. Reviewed [facility] program, and their hours are from 4pm to 8pm. Pt feels this aligns more with her schedule and it is a closer distance to her. Pt also prefers in person to virtual.    Continued recommendations are as follows: self care behaviors, positive social engagements, focusing on overall work/home/life balance, and focusing on positive physical and emotional wellness.    Suicidal/Homicidal: No  Therapist Response: Pt is continuing to apply interventions learned in session into daily life situations. Pt is currently on track to meet goals utilizing interventions mentioned above. Personal growth and progress noted. Treatment to continue as indicated.   Plan: Informed patient that clinician will be leaving outpatient department. Allowed pt to explore any questions or concerns and discussed future counseling options/resources. Provided pt with psychoeducational resources and list of OPT therapists. Encouraged pt to continue with psychiatric med management appointments, if applicable.    Diagnosis:  Encounter Diagnoses  Name Primary?   MDD (major depressive disorder), recurrent, in partial remission (HCC) Yes   Significant use of alcohol     Collaboration of Care: Other Pt encouraged to continue care with psychiatrist of record, Dr. Jomarie Longs  Patient/Guardian was advised Release of Information must be obtained prior to any record release in order to collaborate their care with an outside provider. Patient/Guardian was advised if they have not already done so to contact the registration department to sign all necessary forms in order for [place] to release information regarding their care.   Consent: Patient/Guardian gives verbal consent for treatment and assignment of benefits for services provided during this visit. Patient/Guardian expressed understanding and agreed to proceed.   Shannon Haber Shannon Cammarata, LCSW 09/01/2022

## 2022-08-31 NOTE — Telephone Encounter (Signed)
D:  Dr. Elna Breslow referred pt to virtual MH-IOP.  A:  Oriented pt.  Pt declining at this time.  "I have exercise class M-W-F (9 a.m-10:30 a.m) and I don't want to miss those.  I attend religiously."  Expressed to pt that MH-IOP is only 2-3 weeks depending on authorization.  Pt still declined, stating she would prefer to stay with individual counseling.  Inform Dr. Elna Breslow and Claris Che.

## 2022-08-31 NOTE — Progress Notes (Signed)
BH MD OP Progress Note  08/31/2022 12:09 PM Shannon Crawford  MRN:  [id number]  Chief Complaint:  Chief Complaint  Patient presents with   Follow-up   Anxiety   Depression   Drug / Alcohol Assessment   Medication Refill   HPI: Shannon Crawford is a 67 year old biracial female who has a history of GAD, MDD, chronic pain syndrome, vitiligo, hypertension, hypothyroidism, hyperlipidemia was evaluated in office today.  Patient today reports since the past few weeks she has been struggling with worsening depression and anxiety symptoms.  She describes sadness, increased appetite, concentration problem, worsening anxiety symptoms, feeling nervous, restless, worrying about things, crying spells.  Patient reports she has been trying to self medicate herself with alcohol, currently drinks 2-4 beers at night since the past 6 to 8 weeks.  She then feels guilty about it.  Patient reports she tried tapering down the Cymbalta as per our discussion last visit.  That likely also contributed to the worsening mood symptoms.  Patient also reports situational stressors, her daughter is currently going through a break-up which is a significant trigger for her.  Patient reports passive thoughts of ' what is the point of life', however denies any active suicidality.  Denies any perceptual disturbances.  Denies any homicidality.  Currently compliant on the Wellbutrin.  Denies side effects.  Patient reports she stopped taking the experimental drugs that she was taking for her vitiligo, Ritlecitinib.  She does have an appointment with them tomorrow and plans to discuss that with the provider.  She had initially thought that this medication likely could be contributing to her worsening mood symptoms.  Patient reports good support system from her spouse.  She also continues to be active in her quilting group.  Patient reports she has not been compliant with her therapy sessions since her therapist moved to [place].  She  is agreeable to reach out to her therapist.  Also agreeable to referral to IOP.  Visit Diagnosis:    ICD-10-CM   1. GAD (generalized anxiety disorder)  F41.1 DULoxetine (CYMBALTA) 20 MG capsule    buPROPion (WELLBUTRIN) 75 MG tablet    2. Severe episode of recurrent major depressive disorder, without psychotic features (HCC)  F33.2 DULoxetine (CYMBALTA) 20 MG capsule    buPROPion (WELLBUTRIN) 75 MG tablet    3. Significant use of alcohol  Z78.9 topiramate (TOPAMAX) 25 MG tablet      Past Psychiatric History: I have reviewed past psychiatric history from progress note on 03/29/2021.  Past trials of medications like Lexapro, Zoloft, duloxetine, zolpidem, Rexulti, Xanax, Topamax.  Past Medical History:  Past Medical History:  Diagnosis Date   Anxiety    Fracture    ankle   Heart murmur    Hypothyroid    Impaired glucose tolerance    Mixed hyperlipidemia    OSA on CPAP    Primary vitiligo    Thyroid disease     Past Surgical History:  Procedure Laterality Date   ABDOMINAL SURGERY     tummy tuck   BREAST BIOPSY Left 10+ years   Negative- core bx    Family Psychiatric History: I have reviewed family psychiatric history from progress note on 03/29/2021.  Family History:  Family History  Problem Relation Age of Onset   Hypertension Mother    Alcohol abuse Father    Anxiety disorder Sister    Prostate cancer Brother    Hypertension Sister    Breast cancer Sister 70   Breast cancer Cousin  Social History: I have reviewed social history from progress note on 03/29/2021. Social History   Socioeconomic History   Marital status: Married    Spouse name: John   Number of children: 2   Years of education: Not on file   Highest education level: Some college, no degree  Occupational History   Not on file  Tobacco Use   Smoking status: Never   Smokeless tobacco: Never  Vaping Use   Vaping status: Never Used  Substance and Sexual Activity   Alcohol use: Yes     Alcohol/week: 12.0 standard drinks of alcohol    Types: 12 Cans of beer per week   Drug use: No   Sexual activity: Yes  Other Topics Concern   Not on file  Social History Narrative   Not on file   Social Determinants of Health   Financial Resource Strain: Not on file  Food Insecurity: Not on file  Transportation Needs: Not on file  Physical Activity: Not on file  Stress: Not on file  Social Connections: Not on file    Allergies:  Allergies  Allergen Reactions   Catfish [Fish Allergy] Nausea And Vomiting    Metabolic Disorder Labs: No results found for: "HGBA1C", "MPG" No results found for: "PROLACTIN" No results found for: "CHOL", "TRIG", "HDL", "CHOLHDL", "VLDL", "LDLCALC" No results found for: "TSH"  Therapeutic Level Labs: No results found for: "LITHIUM" No results found for: "VALPROATE" No results found for: "CBMZ"  Current Medications: Current Outpatient Medications  Medication Sig Dispense Refill   B Complex-C (SUPER B COMPLEX PO) Take by mouth.     bimatoprost (LATISSE) 0.03 % ophthalmic solution Place 1 application  into both eyes at bedtime. Place one drop on applicator and apply evenly along the skin of the upper eyelid at base of eyelashes once daily at bedtime; repeat procedure for second eye (use a clean applicator). 3 mL 12   Cholecalciferol (D-3-5) 125 MCG (5000 UT) capsule Take 5,000 Units by mouth daily.     levothyroxine (SYNTHROID) 88 MCG tablet TAKE ONE TABLET BY MOUTH DAILY 90 tablet 3   losartan-hydrochlorothiazide (HYZAAR) 100-25 MG tablet TAKE 1 TABLET BY MOUTH DAILY 90 tablet 3   NON FORMULARY 50 mg. STUDY [id number] RITLECITINIB 50 MG/PLACEBO CAPSULE     rosuvastatin (CRESTOR) 20 MG tablet 20 mg daily.     topiramate (TOPAMAX) 25 MG tablet Take 1 tablet (25 mg total) by mouth at bedtime. 30 tablet 1   zolpidem (AMBIEN) 5 MG tablet TAKE ONE TABLET BY MOUTH EVERY NIGHT AT BEDTIME AS NEEDED FOR SLEEP (Patient taking differently: Take 5 mg by mouth  at bedtime as needed.) 30 tablet 3   buPROPion (WELLBUTRIN) 75 MG tablet Take 1 tablet (75 mg total) by mouth daily with breakfast. 90 tablet 0   DULoxetine (CYMBALTA) 20 MG capsule Take 2 capsules (40 mg total) by mouth daily. 180 capsule 0   No current facility-administered medications for this visit.     Musculoskeletal: Strength & Muscle Tone: within normal limits Gait & Station: normal Patient leans: N/A  Psychiatric Specialty Exam: Review of Systems  Psychiatric/Behavioral:  Positive for dysphoric mood. The patient is nervous/anxious.     Blood pressure 129/80, pulse 81, temperature (!) 97 F (36.1 C), temperature source Skin, height 5\' 5"  (1.651 m), weight 191 lb 6.4 oz (86.8 kg).Body mass index is 31.85 kg/m.  General Appearance: Casual  Eye Contact:  Fair  Speech:  Clear and Coherent  Volume:  Normal  Mood:  Anxious and Depressed  Affect:  Tearful  Thought Process:  Goal Directed and Descriptions of Associations: Intact  Orientation:  Full (Time, Place, and Person)  Thought Content: Logical   Suicidal Thoughts:  No  Homicidal Thoughts:  No  Memory:  Immediate;   Fair Recent;   Fair Remote;   Fair  Judgement:  Intact  Insight:  Fair  Psychomotor Activity:  Normal  Concentration:  Concentration: Fair and Attention Span: Fair  Recall:  [employer] of Knowledge: Fair  Language: Fair  Akathisia:  No  Handed:  Right  AIMS (if indicated): not done  Assets:  Communication Skills Desire for Improvement Social Support  ADL's:  Intact  Cognition: WNL  Sleep:  Fair   Screenings: Midwife Visit from 09/29/2021 in [facility] Office Visit from 07/14/2021 in [facility]  AIMS Total Score 0 0      AUDIT    Flowsheet Row Counselor from 04/06/2022 in [facility] Visit from 03/29/2021 in [facility]  Alcohol Use Disorder Identification Test Final Score (AUDIT) 15 13      GAD-7    Flowsheet Row Office Visit from 08/31/2022 in [facility] Office Visit from 03/30/2022 in [facility] Office Visit from 12/28/2021 in [facility] Office Visit from 09/29/2021 in [facility] Office Visit from 06/15/2021 in [facility]  Total GAD-7 Score 14 3 1 4 8       PHQ2-9    Flowsheet Row Office Visit from 08/31/2022 in [facility] Office Visit from 03/30/2022 in [facility] Office Visit from 12/28/2021 in [facility] Office Visit from 09/29/2021 in [facility] Office Visit from 07/14/2021 in [facility]  PHQ-2 Total Score 5 1 1 2 2   PHQ-9 Total Score 17 4 5 6 11       Flowsheet Row Office Visit from 08/31/2022 in [facility] Counselor from 05/04/2022 in [facility] at [facility] Visit from 03/30/2022 in [facility]  C-SSRS RISK CATEGORY Low Risk No Risk No Risk        Assessment and Plan: Shannon Crawford is a 67 year old female, married, lives in [place], has a history of depression, anxiety, vitiligo, hypothyroidism, chronic pain was evaluated in office today.  Patient chronic pain as well as current situational stressors presents with worsening mood symptoms, recent reduction of antidepressant-Cymbalta dosage which likely also have contributed to the worsening mood symptoms.  Patient also with comorbid significant use of alcohol recently, will benefit from the following  plan.  Plan GAD-unstable Increase Cymbalta to 40 mg p.o. daily. Wellbutrin 75 mg p.o. daily in the morning. Continue CBT-patient encouraged to reach out to her therapist Ms. Christina Hussami  MDD-unstable Start Wellbutrin 75 mg p.o. daily Increase Cymbalta to 40 mg p.o. daily. Continue Zolpidem 5 mg po at bedtime prn. Continue CBT. Will refer for MH IOP , I have referred to [facility].  Significant use of alcohol-provided counseling. We will start Topamax 25 mg p.o. nightly. Patient also referred to MH IOP.  Follow-up in clinic in 6 weeks or sooner if needed.  Collaboration  of Care: Collaboration of Care: Referral or follow-up with counselor/therapist AEB patient encouraged to reach out to her therapist and continue CBT.  I have also referred to MH IOP.  Crisis plan discussed with patient.  Patient/Guardian was advised Release of Information must be obtained prior to any record release in order to collaborate their care with an outside provider. Patient/Guardian was advised if they have not already done so to contact the registration department to sign all necessary forms in order for [place] to release information regarding their care.   Consent: Patient/Guardian gives verbal consent for treatment and assignment of benefits for services provided during this visit. Patient/Guardian expressed understanding and agreed to proceed.   This note was generated in part or whole with voice recognition software. Voice recognition is usually quite accurate but there are transcription errors that can and very often do occur. I apologize for any typographical errors that were not detected and corrected.    Jomarie Longs, MD 09/01/2022, 8:52 AM

## 2022-09-01 DIAGNOSIS — Z0110 Encounter for examination of ears and hearing without abnormal findings: Secondary | ICD-10-CM | POA: Diagnosis not present

## 2022-09-01 NOTE — Patient Instructions (Signed)
Outpatient Psychiatry and Counseling  FOR CRISIS:  call 911, [facility]: Mobile Crisis Management 24 hours:  [phone], call [phone], [facility] ([facility]) [address] walk in, or go to your local EMERGENCY DEPARTMENT  [facility] [address], [place], [place] [address]  [phone]  [facility] [address] [place], [place] [address] [phone]  [facility] Address: [address] [address], [place], [place] [address] Phone: [phone]  [facility] ([place] Locations) [email or web address]  [facility] sliding scale fee and walk in schedule: M-F 8am-12pm/1pm-3pm [address]  [place], [place] [address] [phone]  [facility] [address] [place], [place] [address] [phone]  [facility]/ Intensive Outpatient Therapy Program/CDIOP/PHP [address] [place], [place] [address] [phone]  [facility]                  Crisis Services, Outpatient Therapy Services, Walk in Services      [phone]     [address]    [place], [place] [address]                 [facility]   [facility] [phone] [address] [place], [place] [address]  [facility]          [address],  [place], [place] [address]       [phone]  [facility] [address] [place], [place] [address] [phone]  [facility]    [address]    [place], [place] [address]     [phone]       [facility] [address] [place] [address]  [facility]     [address]     [place], [place]      [phone]       [facility] Shamsher Ahluwalia, MD [address]  [address] [place], [place] [address] [phone]  [facility]     [address]     [place], [place] [address]     [phone]       [facility] [address] [place], [place] [address] [phone] Resources for Temporary Residential Assistance/Crisis Centers  

## 2022-09-08 DIAGNOSIS — M7542 Impingement syndrome of left shoulder: Secondary | ICD-10-CM | POA: Diagnosis not present

## 2022-09-08 DIAGNOSIS — M7502 Adhesive capsulitis of left shoulder: Secondary | ICD-10-CM | POA: Diagnosis not present

## 2022-09-08 DIAGNOSIS — M7522 Bicipital tendinitis, left shoulder: Secondary | ICD-10-CM | POA: Diagnosis not present

## 2022-09-12 ENCOUNTER — Other Ambulatory Visit: Payer: Medicare HMO

## 2022-09-12 ENCOUNTER — Ambulatory Visit: Payer: Medicare HMO | Admitting: Internal Medicine

## 2022-09-16 ENCOUNTER — Other Ambulatory Visit: Payer: Self-pay | Admitting: Psychiatry

## 2022-09-16 DIAGNOSIS — F411 Generalized anxiety disorder: Secondary | ICD-10-CM

## 2022-09-16 DIAGNOSIS — F332 Major depressive disorder, recurrent severe without psychotic features: Secondary | ICD-10-CM

## 2022-09-22 DIAGNOSIS — R69 Illness, unspecified: Secondary | ICD-10-CM | POA: Diagnosis not present

## 2022-09-25 ENCOUNTER — Other Ambulatory Visit: Payer: Medicare HMO

## 2022-09-25 DIAGNOSIS — E039 Hypothyroidism, unspecified: Secondary | ICD-10-CM | POA: Diagnosis not present

## 2022-09-25 DIAGNOSIS — E782 Mixed hyperlipidemia: Secondary | ICD-10-CM | POA: Diagnosis not present

## 2022-09-25 DIAGNOSIS — L80 Vitiligo: Secondary | ICD-10-CM | POA: Diagnosis not present

## 2022-09-25 DIAGNOSIS — R7302 Impaired glucose tolerance (oral): Secondary | ICD-10-CM | POA: Diagnosis not present

## 2022-09-26 ENCOUNTER — Encounter: Payer: Self-pay | Admitting: Internal Medicine

## 2022-09-26 ENCOUNTER — Ambulatory Visit: Payer: Medicare HMO

## 2022-09-26 ENCOUNTER — Other Ambulatory Visit: Payer: Self-pay | Admitting: Psychiatry

## 2022-09-26 ENCOUNTER — Ambulatory Visit (INDEPENDENT_AMBULATORY_CARE_PROVIDER_SITE_OTHER): Payer: Medicare HMO | Admitting: Internal Medicine

## 2022-09-26 VITALS — BP 125/80 | HR 80 | Ht 65.0 in | Wt 193.2 lb

## 2022-09-26 DIAGNOSIS — Z13820 Encounter for screening for osteoporosis: Secondary | ICD-10-CM

## 2022-09-26 DIAGNOSIS — I10 Essential (primary) hypertension: Secondary | ICD-10-CM

## 2022-09-26 DIAGNOSIS — F411 Generalized anxiety disorder: Secondary | ICD-10-CM

## 2022-09-26 DIAGNOSIS — L80 Vitiligo: Secondary | ICD-10-CM | POA: Diagnosis not present

## 2022-09-26 DIAGNOSIS — E782 Mixed hyperlipidemia: Secondary | ICD-10-CM | POA: Diagnosis not present

## 2022-09-26 DIAGNOSIS — E039 Hypothyroidism, unspecified: Secondary | ICD-10-CM | POA: Diagnosis not present

## 2022-09-26 DIAGNOSIS — M8589 Other specified disorders of bone density and structure, multiple sites: Secondary | ICD-10-CM

## 2022-09-26 DIAGNOSIS — R7302 Impaired glucose tolerance (oral): Secondary | ICD-10-CM

## 2022-09-26 DIAGNOSIS — F332 Major depressive disorder, recurrent severe without psychotic features: Secondary | ICD-10-CM

## 2022-09-26 MED ORDER — LEVOTHYROXINE SODIUM 112 MCG PO TABS
112.0000 ug | ORAL_TABLET | Freq: Every day | ORAL | 11 refills | Status: DC
Start: 2022-09-26 — End: 2023-09-18

## 2022-09-26 NOTE — Progress Notes (Signed)
Established Patient Office Visit  Subjective:  Patient ID: Shannon Crawford, female    DOB: 1955/04/25  Age: 67 y.o. MRN: [id number]  Chief Complaint  Patient presents with   Follow-up    4 MO F/U    Patient comes in for her follow-up and to discuss her blood work.  Her TSH is found to be very high at this time.  Patient states she has been taking her Levoxyl regularly every morning but she takes other medications at the same time.  She admits to having some fatigue, low energy, and low motivation.  Patient has also gained weight. Will switch to higher dose Levoxyl but needs to take empty stomach. Depression is managed by her psychiatrist.    No other concerns at this time.   Past Medical History:  Diagnosis Date   Anxiety    Fracture    ankle   Heart murmur    Hypothyroid    Impaired glucose tolerance    Mixed hyperlipidemia    OSA on CPAP    Primary vitiligo    Thyroid disease     Past Surgical History:  Procedure Laterality Date   ABDOMINAL SURGERY     tummy tuck   BREAST BIOPSY Left 10+ years   Negative- core bx    Social History   Socioeconomic History   Marital status: Married    Spouse name: John   Number of children: 2   Years of education: Not on file   Highest education level: Some college, no degree  Occupational History   Not on file  Tobacco Use   Smoking status: Never   Smokeless tobacco: Never  Vaping Use   Vaping status: Never Used  Substance and Sexual Activity   Alcohol use: Yes    Alcohol/week: 12.0 standard drinks of alcohol    Types: 12 Cans of beer per week   Drug use: No   Sexual activity: Yes  Other Topics Concern   Not on file  Social History Narrative   Not on file   Social Determinants of Health   Financial Resource Strain: Not on file  Food Insecurity: Not on file  Transportation Needs: Not on file  Physical Activity: Not on file  Stress: Not on file  Social Connections: Not on file  Intimate Partner Violence: Not  on file    Family History  Problem Relation Age of Onset   Hypertension Mother    Alcohol abuse Father    Anxiety disorder Sister    Prostate cancer Brother    Hypertension Sister    Breast cancer Sister 62   Breast cancer Cousin     Allergies  Allergen Reactions   Catfish [Fish Allergy] Nausea And Vomiting    Review of Systems  Constitutional: Negative.   HENT: Negative.    Eyes: Negative.   Respiratory: Negative.  Negative for cough and shortness of breath.   Cardiovascular: Negative.  Negative for chest pain, palpitations and leg swelling.  Gastrointestinal: Negative.  Negative for abdominal pain, constipation, diarrhea, heartburn, nausea and vomiting.  Genitourinary: Negative.  Negative for dysuria and flank pain.  Musculoskeletal: Negative.  Negative for joint pain and myalgias.  Skin: Negative.   Neurological: Negative.  Negative for dizziness and headaches.  Endo/Heme/Allergies: Negative.   Psychiatric/Behavioral: Negative.  Negative for depression and suicidal ideas. The patient is not nervous/anxious.        Objective:   BP 125/80   Pulse 80   Ht 5\' 5"  (1.651 m)  Wt 193 lb 3.2 oz (87.6 kg)   SpO2 98%   BMI 32.15 kg/m   Vitals:   09/26/22 1354  BP: 125/80  Pulse: 80  Height: 5\' 5"  (1.651 m)  Weight: 193 lb 3.2 oz (87.6 kg)  SpO2: 98%  BMI (Calculated): 32.15    Physical Exam Vitals and nursing note reviewed.  Constitutional:      Appearance: Normal appearance.  HENT:     Head: Normocephalic and atraumatic.     Nose: Nose normal.     Mouth/Throat:     Mouth: Mucous membranes are moist.     Pharynx: Oropharynx is clear.  Eyes:     Conjunctiva/sclera: Conjunctivae normal.     Pupils: Pupils are equal, round, and reactive to light.  Cardiovascular:     Rate and Rhythm: Normal rate and regular rhythm.     Pulses: Normal pulses.     Heart sounds: Normal heart sounds. No murmur heard. Pulmonary:     Effort: Pulmonary effort is normal.      Breath sounds: Normal breath sounds. No wheezing.  Abdominal:     General: Bowel sounds are normal.     Palpations: Abdomen is soft.     Tenderness: There is no abdominal tenderness. There is no right CVA tenderness or left CVA tenderness.  Musculoskeletal:        General: Normal range of motion.     Cervical back: Normal range of motion.     Right lower leg: No edema.     Left lower leg: No edema.  Skin:    General: Skin is warm and dry.  Neurological:     General: No focal deficit present.     Mental Status: She is alert and oriented to person, place, and time.  Psychiatric:        Mood and Affect: Mood normal.        Behavior: Behavior normal.      No results found for any visits on 09/26/22.  Recent Results (from the past 2160 hour(s))  CMP14+EGFR     Status: Abnormal   Collection Time: 09/25/22 11:16 AM  Result Value Ref Range   Glucose 98 70 - 99 mg/dL   BUN 23 8 - 27 mg/dL   Creatinine, Ser 2.59 (H) 0.57 - 1.00 mg/dL   eGFR 54 (L) >56 LO/VFI/4.33   BUN/Creatinine Ratio 21 12 - 28   Sodium 143 134 - 144 mmol/L   Potassium 4.0 3.5 - 5.2 mmol/L   Chloride 103 96 - 106 mmol/L   CO2 23 20 - 29 mmol/L   Calcium 9.8 8.7 - 10.3 mg/dL   Total Protein 6.9 6.0 - 8.5 g/dL   Albumin 4.4 3.9 - 4.9 g/dL   Globulin, Total 2.5 1.5 - 4.5 g/dL   Bilirubin Total 0.5 0.0 - 1.2 mg/dL   Alkaline Phosphatase 82 44 - 121 IU/L   AST 23 0 - 40 IU/L   ALT 26 0 - 32 IU/L  Lipid Panel w/o Chol/HDL Ratio     Status: Abnormal   Collection Time: 09/25/22 11:16 AM  Result Value Ref Range   Cholesterol, Total 216 (H) 100 - 199 mg/dL   Triglycerides 295 0 - 149 mg/dL   HDL 82 >18 mg/dL   VLDL Cholesterol Cal 22 5 - 40 mg/dL   LDL Chol Calc (NIH) 841 (H) 0 - 99 mg/dL  YSA+[id number]     Status: Abnormal   Collection Time: 09/25/22 11:16 AM  Result Value Ref  Range   TSH 22.000 (H) 0.450 - 4.500 uIU/mL   T3, Free 2.0 2.0 - 4.4 pg/mL   Free T4 0.95 0.82 - 1.77 ng/dL  Hemoglobin W0J      Status: Abnormal   Collection Time: 09/25/22 11:16 AM  Result Value Ref Range   Hgb A1c MFr Bld 6.3 (H) 4.8 - 5.6 %    Comment:          Prediabetes: 5.7 - 6.4          Diabetes: >6.4          Glycemic control for adults with diabetes: <7.0    Est. average glucose Bld gHb Est-mCnc 134 mg/dL  CBC with Differential/Platelet     Status: None   Collection Time: 09/25/22 11:16 AM  Result Value Ref Range   WBC 5.3 3.4 - 10.8 x10E3/uL   RBC 4.08 3.77 - 5.28 x10E6/uL   Hemoglobin 12.1 11.1 - 15.9 g/dL   Hematocrit 81.1 91.4 - 46.6 %   MCV 90 79 - 97 fL   MCH 29.7 26.6 - 33.0 pg   MCHC 32.8 31.5 - 35.7 g/dL   RDW 78.2 95.6 - 21.3 %   Platelets 181 150 - 450 x10E3/uL   Neutrophils 74 Not Estab. %   Lymphs 15 Not Estab. %   Monocytes 7 Not Estab. %   Eos 3 Not Estab. %   Basos 1 Not Estab. %   Neutrophils Absolute 3.9 1.4 - 7.0 x10E3/uL   Lymphocytes Absolute 0.8 0.7 - 3.1 x10E3/uL   Monocytes Absolute 0.4 0.1 - 0.9 x10E3/uL   EOS (ABSOLUTE) 0.1 0.0 - 0.4 x10E3/uL   Basophils Absolute 0.0 0.0 - 0.2 x10E3/uL   Immature Granulocytes 0 Not Estab. %   Immature Grans (Abs) 0.0 0.0 - 0.1 x10E3/uL      Assessment & Plan:  Increase dose of Levoxyl.  Repeat TSH in 4 weeks.  Patient encouraged to drink more water. Problem List Items Addressed This Visit     GAD (generalized anxiety disorder)   Hypothyroidism   Relevant Medications   levothyroxine (SYNTHROID) 112 MCG tablet   Other Relevant Orders   TSH+T4F+T3Free   Essential hypertension, benign - Primary   Relevant Orders   Basic metabolic panel   Impaired glucose tolerance   Mixed hyperlipidemia   Primary vitiligo    Return in about 1 month (around 10/27/2022).   Total time spent: 30 minutes  Margaretann Loveless, MD  09/26/2022   This document may have been prepared by [facility] Voice Recognition software and as such may include unintentional dictation errors.

## 2022-10-18 ENCOUNTER — Other Ambulatory Visit: Payer: Self-pay | Admitting: Psychiatry

## 2022-10-18 DIAGNOSIS — F3341 Major depressive disorder, recurrent, in partial remission: Secondary | ICD-10-CM

## 2022-10-19 ENCOUNTER — Ambulatory Visit: Payer: Medicare HMO | Admitting: Psychiatry

## 2022-10-19 ENCOUNTER — Encounter: Payer: Self-pay | Admitting: Psychiatry

## 2022-10-19 VITALS — BP 111/72 | HR 92 | Temp 97.0°F | Ht 65.0 in | Wt 194.2 lb

## 2022-10-19 DIAGNOSIS — F411 Generalized anxiety disorder: Secondary | ICD-10-CM | POA: Diagnosis not present

## 2022-10-19 DIAGNOSIS — Z789 Other specified health status: Secondary | ICD-10-CM

## 2022-10-19 DIAGNOSIS — F3342 Major depressive disorder, recurrent, in full remission: Secondary | ICD-10-CM

## 2022-10-19 MED ORDER — ZOLPIDEM TARTRATE 5 MG PO TABS
5.0000 mg | ORAL_TABLET | Freq: Every evening | ORAL | 1 refills | Status: DC | PRN
Start: 2022-10-19 — End: 2023-04-10

## 2022-10-19 NOTE — Progress Notes (Signed)
BH MD OP Progress Note  10/19/2022 12:39 PM LUCINDY KOENEMAN  MRN:  [id number]  Chief Complaint:  Chief Complaint  Patient presents with   Follow-up   Depression   Anxiety   Medication Refill   HPI: CELSEA HELTZEL is a 67 year old biracial female who has a history of GAD, MDD, chronic pain syndrome, vitiligo, hypertension, hypothyroidism, hyperlipidemia was evaluated in office today.  Patient today reports she recently had thyroid labs drawn which was abnormal, TSH was elevated, currently on a higher dosage of levothyroxine at 112 mcg daily.  Patient reports since starting the new dosage of levothyroxine her mood symptoms have improved a lot.  She does not feel depressed like she was at her previous visit.  Patient reports her appetite has improved.  She is able to focus better and has not had any significant crying spells.  Patient reports she does not feel as nervous or anxious like she was feeling previously.  Patient reports she is compliant on the duloxetine.  Denies side effects.  She would like to come off of the duloxetine or reduce the dose if possible.  She continues to be compliant on the Wellbutrin.  She does struggle with sleep when she does not take the Ambien.  Patient reports she is about to be out of her prescription and will need refills sent to the pharmacy.  The Ambien does help her to fall asleep quicker.  Even when she has interrupted sleep at night she is able to fall back asleep okay.  Patient reports she has not had any trouble with alcohol lately.  She did not take the Topamax too long.  She feels she is okay without it.  Patient currently denies any suicidality, homicidality or perceptual disturbances.  She continues to be part of her quilting group and that helps her.  She would like to lose some weight and would like to exercise more and watch her diet.  Patient denies any other concerns today.  Visit Diagnosis:    ICD-10-CM   1. GAD (generalized anxiety  disorder)  F41.1     2. MDD (major depressive disorder), recurrent, in full remission (HCC)  F33.42 zolpidem (AMBIEN) 5 MG tablet    3. Significant use of alcohol  Z78.9       Past Psychiatric History: I had past psychiatric history from progress note on 03/29/2021.  Past trials of medications like Lexapro, Zoloft, duloxetine, zolpidem, Rexulti, Xanax, Topamax.  Past Medical History:  Past Medical History:  Diagnosis Date   Anxiety    Fracture    ankle   Heart murmur    Hypothyroid    Impaired glucose tolerance    Mixed hyperlipidemia    OSA on CPAP    Primary vitiligo    Thyroid disease     Past Surgical History:  Procedure Laterality Date   ABDOMINAL SURGERY     tummy tuck   BREAST BIOPSY Left 10+ years   Negative- core bx    Family Psychiatric History: I have reviewed family psychiatric history from progress note on 03/29/2021.  Family History:  Family History  Problem Relation Age of Onset   Hypertension Mother    Alcohol abuse Father    Anxiety disorder Sister    Prostate cancer Brother    Hypertension Sister    Breast cancer Sister 67   Breast cancer Cousin     Social History: I have reviewed social history from progress note on 03/29/2021. Social History   Socioeconomic History  Marital status: Married    Spouse name: John   Number of children: 2   Years of education: Not on file   Highest education level: Some college, no degree  Occupational History   Not on file  Tobacco Use   Smoking status: Never   Smokeless tobacco: Never  Vaping Use   Vaping status: Never Used  Substance and Sexual Activity   Alcohol use: Yes    Alcohol/week: 12.0 standard drinks of alcohol    Types: 12 Cans of beer per week   Drug use: No   Sexual activity: Yes  Other Topics Concern   Not on file  Social History Narrative   Not on file   Social Determinants of Health   Financial Resource Strain: Not on file  Food Insecurity: Not on file  Transportation Needs: Not  on file  Physical Activity: Not on file  Stress: Not on file  Social Connections: Not on file    Allergies:  Allergies  Allergen Reactions   Catfish [Fish Allergy] Nausea And Vomiting    Metabolic Disorder Labs: Lab Results  Component Value Date   HGBA1C 6.3 (H) 09/25/2022   No results found for: "PROLACTIN" Lab Results  Component Value Date   CHOL 216 (H) 09/25/2022   TRIG 130 09/25/2022   HDL 82 09/25/2022   LDLCALC 112 (H) 09/25/2022   Lab Results  Component Value Date   TSH 22.000 (H) 09/25/2022    Therapeutic Level Labs: No results found for: "LITHIUM" No results found for: "VALPROATE" No results found for: "CBMZ"  Current Medications: Current Outpatient Medications  Medication Sig Dispense Refill   B Complex-C (SUPER B COMPLEX PO) Take by mouth.     bimatoprost (LATISSE) 0.03 % ophthalmic solution Place 1 application  into both eyes at bedtime. Place one drop on applicator and apply evenly along the skin of the upper eyelid at base of eyelashes once daily at bedtime; repeat procedure for second eye (use a clean applicator). 3 mL 12   buPROPion (WELLBUTRIN) 75 MG tablet TAKE 1 TABLET BY MOUTH DAILY WITH BREAKFAST 90 tablet 0   Cholecalciferol (D-3-5) 125 MCG (5000 UT) capsule Take 5,000 Units by mouth daily.     DULoxetine (CYMBALTA) 20 MG capsule Take 2 capsules (40 mg total) by mouth daily. 180 capsule 0   levothyroxine (SYNTHROID) 112 MCG tablet Take 1 tablet (112 mcg total) by mouth daily. 30 tablet 11   losartan-hydrochlorothiazide (HYZAAR) 100-25 MG tablet TAKE 1 TABLET BY MOUTH DAILY 90 tablet 3   NON FORMULARY 50 mg. STUDY [id number] RITLECITINIB 50 MG/PLACEBO CAPSULE     rosuvastatin (CRESTOR) 20 MG tablet 20 mg daily.     zolpidem (AMBIEN) 5 MG tablet Take 1 tablet (5 mg total) by mouth at bedtime as needed for sleep. 30 tablet 1   No current facility-administered medications for this visit.     Musculoskeletal: Strength & Muscle Tone: within normal  limits Gait & Station: normal Patient leans: N/A  Psychiatric Specialty Exam: Review of Systems  Psychiatric/Behavioral:  Positive for sleep disturbance (Improving).     Blood pressure 111/72, pulse 92, temperature (!) 97 F (36.1 C), temperature source Skin, height 5\' 5"  (1.651 m), weight 194 lb 3.2 oz (88.1 kg).Body mass index is 32.32 kg/m.  General Appearance: Casual  Eye Contact:  Fair  Speech:  Clear and Coherent  Volume:  Normal  Mood:  Euthymic  Affect:  Congruent  Thought Process:  Goal Directed and Descriptions of Associations: Intact  Orientation:  Full (Time, Place, and Person)  Thought Content: Logical   Suicidal Thoughts:  No  Homicidal Thoughts:  No  Memory:  Immediate;   Fair Recent;   Fair Remote;   Fair  Judgement:  Fair  Insight:  Fair  Psychomotor Activity:  Normal  Concentration:  Concentration: Fair and Attention Span: Fair  Recall:  [employer] of Knowledge: Fair  Language: Fair  Akathisia:  No  Handed:  Right  AIMS (if indicated): not done  Assets:  Communication Skills Desire for Improvement Resilience Social Support Talents/Skills Transportation  ADL's:  Intact  Cognition: WNL  Sleep:   Improving   Screenings: Geneticist, molecular Office Visit from 09/29/2021 in [facility] Office Visit from 07/14/2021 in [facility]  AIMS Total Score 0 0      AUDIT    Flowsheet Row Counselor from 04/06/2022 in [facility] Visit from 03/29/2021 in [facility]  Alcohol Use Disorder Identification Test Final Score (AUDIT) 15 13      GAD-7    Flowsheet Row Office Visit from 10/19/2022 in [facility] Office Visit from 08/31/2022 in [facility] Office Visit from 03/30/2022 in [facility]  [facility] Associates Office Visit from 12/28/2021 in [facility] Office Visit from 09/29/2021 in [facility]  Total GAD-7 Score 0 14 3 1 4       PHQ2-9    Flowsheet Row Office Visit from 10/19/2022 in [facility] Office Visit from 08/31/2022 in [facility] Office Visit from 03/30/2022 in [facility] Office Visit from 12/28/2021 in [facility] Office Visit from 09/29/2021 in [facility]  PHQ-2 Total Score 0 5 1 1 2   PHQ-9 Total Score 1 17 4 5 6       Flowsheet Row Office Visit from 10/19/2022 in [facility] Office Visit from 08/31/2022 in [facility] Counselor from 05/04/2022 in [facility] at [facility] RISK CATEGORY No Risk Low Risk No Risk        Assessment and Plan: DASHA OLVER is a 67 year old female, married, lives in [place], has a history of depression, anxiety, vitiligo, hypothyroidism, chronic pain was evaluated in office today.  Patient is currently stable.  Patient with recent thyroid abnormalities currently on a higher dosage of levothyroxine which likely also help with her mood symptoms, she will continue to benefit from the following plan.  Plan GAD-stable Cymbalta 40 mg p.o. daily Wellbutrin 75 mg p.o. daily in the morning Continue CBT as needed.  MDD in remission Wellbutrin 75 mg p.o. daily Cymbalta 40 mg p.o. daily Zolpidem 5 mg p.o. nightly as needed Continue CBT as needed Reviewed [place] PMP AWARxE   Significant use of alcohol-currently improving Discontinue Topamax for noncompliance. Patient is currently cutting back and does not have a problem with it.   Patient  advised to reach out to primary care provider-may need repeat TSH level.  Once her thyroid labs are normal we will consider reducing the dosage of Cymbalta, (patient requests being tapered down of the Cymbalta).  Follow-up in clinic in 2 to 3 months or sooner if needed.  Consent: Patient/Guardian gives verbal consent for treatment and assignment of benefits for services provided during this visit. Patient/Guardian expressed understanding and agreed to proceed.   This note was generated in part or whole with voice recognition software. Voice recognition is usually quite accurate but there are transcription errors that can and very often do occur. I apologize for any typographical errors that were not detected and corrected.    Jomarie Longs, MD 10/20/2022, 8:27 AM

## 2022-10-26 ENCOUNTER — Other Ambulatory Visit: Payer: Self-pay

## 2022-10-26 ENCOUNTER — Other Ambulatory Visit: Payer: Medicare HMO

## 2022-10-26 DIAGNOSIS — I10 Essential (primary) hypertension: Secondary | ICD-10-CM | POA: Diagnosis not present

## 2022-10-26 DIAGNOSIS — E039 Hypothyroidism, unspecified: Secondary | ICD-10-CM

## 2022-10-27 ENCOUNTER — Ambulatory Visit (INDEPENDENT_AMBULATORY_CARE_PROVIDER_SITE_OTHER): Payer: Medicare HMO | Admitting: Internal Medicine

## 2022-10-27 ENCOUNTER — Encounter: Payer: Self-pay | Admitting: Internal Medicine

## 2022-10-27 VITALS — BP 140/84 | HR 91 | Ht 63.0 in | Wt 195.2 lb

## 2022-10-27 DIAGNOSIS — I10 Essential (primary) hypertension: Secondary | ICD-10-CM

## 2022-10-27 DIAGNOSIS — L80 Vitiligo: Secondary | ICD-10-CM | POA: Diagnosis not present

## 2022-10-27 DIAGNOSIS — E039 Hypothyroidism, unspecified: Secondary | ICD-10-CM | POA: Diagnosis not present

## 2022-10-27 DIAGNOSIS — F411 Generalized anxiety disorder: Secondary | ICD-10-CM | POA: Diagnosis not present

## 2022-10-27 DIAGNOSIS — E782 Mixed hyperlipidemia: Secondary | ICD-10-CM

## 2022-10-27 LAB — TSH+T4F+T3FREE
Free T4: 1.29 ng/dL (ref 0.82–1.77)
T3, Free: 2.3 pg/mL (ref 2.0–4.4)
TSH: 3.46 u[IU]/mL (ref 0.450–4.500)

## 2022-10-27 NOTE — Progress Notes (Signed)
Established Patient Office Visit  Subjective:  Patient ID: Shannon Crawford, female    DOB: [birth year]  Age: 67 y.o. MRN: [id number]  Chief Complaint  Patient presents with   Follow-up    1 month follow up    Patient is here for a follow-up and to discuss thyroid lab results.  Her TSH has improved significantly now.  Patient admits that she is feeling much better and has more energy.  Needs to continue the same dose.     No other concerns at this time.   Past Medical History:  Diagnosis Date   Anxiety    Fracture    ankle   Heart murmur    Hypothyroid    Impaired glucose tolerance    Mixed hyperlipidemia    OSA on CPAP    Primary vitiligo    Thyroid disease     Past Surgical History:  Procedure Laterality Date   ABDOMINAL SURGERY     tummy tuck   BREAST BIOPSY Left 10+ years   Negative- core bx    Social History   Socioeconomic History   Marital status: Married    Spouse name: John   Number of children: 2   Years of education: Not on file   Highest education level: Some college, no degree  Occupational History   Not on file  Tobacco Use   Smoking status: Never   Smokeless tobacco: Never  Vaping Use   Vaping status: Never Used  Substance and Sexual Activity   Alcohol use: Yes    Alcohol/week: 12.0 standard drinks of alcohol    Types: 12 Cans of beer per week   Drug use: No   Sexual activity: Yes  Other Topics Concern   Not on file  Social History Narrative   Not on file   Social Determinants of Health   Financial Resource Strain: Not on file  Food Insecurity: Not on file  Transportation Needs: Not on file  Physical Activity: Not on file  Stress: Not on file  Social Connections: Not on file  Intimate Partner Violence: Not on file    Family History  Problem Relation Age of Onset   Hypertension Mother    Alcohol abuse Father    Anxiety disorder Sister    Prostate cancer Brother    Hypertension Sister    Breast cancer Sister 39   Breast  cancer Cousin     Allergies  Allergen Reactions   Catfish [Fish Allergy] Nausea And Vomiting    Review of Systems  Constitutional: Negative.  Negative for chills, fever, malaise/fatigue and weight loss.  HENT: Negative.    Eyes: Negative.   Respiratory: Negative.  Negative for cough and shortness of breath.   Cardiovascular: Negative.  Negative for chest pain, palpitations and leg swelling.  Gastrointestinal: Negative.  Negative for abdominal pain, constipation, diarrhea, heartburn, nausea and vomiting.  Genitourinary: Negative.  Negative for dysuria and flank pain.  Musculoskeletal: Negative.  Negative for joint pain and myalgias.  Skin: Negative.   Neurological: Negative.  Negative for dizziness and headaches.  Endo/Heme/Allergies: Negative.   Psychiatric/Behavioral: Negative.  Negative for depression and suicidal ideas. The patient is not nervous/anxious.        Objective:   BP (!) 140/84   Pulse 91   Ht 5\' 3"  (1.6 m)   Wt 195 lb 3.2 oz (88.5 kg)   SpO2 95%   BMI 34.58 kg/m   Vitals:   10/27/22 1330  BP: (!) 140/84  Pulse:  91  Height: 5\' 3"  (1.6 m)  Weight: 195 lb 3.2 oz (88.5 kg)  SpO2: 95%  BMI (Calculated): 34.59    Physical Exam Vitals and nursing note reviewed.  Constitutional:      Appearance: Normal appearance.  HENT:     Head: Normocephalic and atraumatic.     Nose: Nose normal.     Mouth/Throat:     Mouth: Mucous membranes are moist.     Pharynx: Oropharynx is clear.  Eyes:     Conjunctiva/sclera: Conjunctivae normal.     Pupils: Pupils are equal, round, and reactive to light.  Cardiovascular:     Rate and Rhythm: Normal rate and regular rhythm.     Pulses: Normal pulses.     Heart sounds: Normal heart sounds. No murmur heard. Pulmonary:     Effort: Pulmonary effort is normal.     Breath sounds: Normal breath sounds. No wheezing.  Abdominal:     General: Bowel sounds are normal.     Palpations: Abdomen is soft.     Tenderness: There is  no abdominal tenderness. There is no right CVA tenderness or left CVA tenderness.  Musculoskeletal:        General: Normal range of motion.     Cervical back: Normal range of motion.     Right lower leg: No edema.     Left lower leg: No edema.  Skin:    General: Skin is warm and dry.  Neurological:     General: No focal deficit present.     Mental Status: She is alert and oriented to person, place, and time.  Psychiatric:        Mood and Affect: Mood normal.        Behavior: Behavior normal.      No results found for any visits on 10/27/22.  Recent Results (from the past 2160 hour(s))  CMP14+EGFR     Status: Abnormal   Collection Time: 09/25/22 11:16 AM  Result Value Ref Range   Glucose 98 70 - 99 mg/dL   BUN 23 8 - 27 mg/dL   Creatinine, Ser 1.61 (H) 0.57 - 1.00 mg/dL   eGFR 54 (L) >09 UE/AVW/0.98   BUN/Creatinine Ratio 21 12 - 28   Sodium 143 134 - 144 mmol/L   Potassium 4.0 3.5 - 5.2 mmol/L   Chloride 103 96 - 106 mmol/L   CO2 23 20 - 29 mmol/L   Calcium 9.8 8.7 - 10.3 mg/dL   Total Protein 6.9 6.0 - 8.5 g/dL   Albumin 4.4 3.9 - 4.9 g/dL   Globulin, Total 2.5 1.5 - 4.5 g/dL   Bilirubin Total 0.5 0.0 - 1.2 mg/dL   Alkaline Phosphatase 82 44 - 121 IU/L   AST 23 0 - 40 IU/L   ALT 26 0 - 32 IU/L  Lipid Panel w/o Chol/HDL Ratio     Status: Abnormal   Collection Time: 09/25/22 11:16 AM  Result Value Ref Range   Cholesterol, Total 216 (H) 100 - 199 mg/dL   Triglycerides 119 0 - 149 mg/dL   HDL 82 >14 mg/dL   VLDL Cholesterol Cal 22 5 - 40 mg/dL   LDL Chol Calc (NIH) 782 (H) 0 - 99 mg/dL  NFA+O1H+Y8MVHQ     Status: Abnormal   Collection Time: 09/25/22 11:16 AM  Result Value Ref Range   TSH 22.000 (H) 0.450 - 4.500 uIU/mL   T3, Free 2.0 2.0 - 4.4 pg/mL   Free T4 0.95 0.82 - 1.77 ng/dL  Hemoglobin A1c     Status: Abnormal   Collection Time: 09/25/22 11:16 AM  Result Value Ref Range   Hgb A1c MFr Bld 6.3 (H) 4.8 - 5.6 %    Comment:          Prediabetes: 5.7 - 6.4           Diabetes: >6.4          Glycemic control for adults with diabetes: <7.0    Est. average glucose Bld gHb Est-mCnc 134 mg/dL  CBC with Differential/Platelet     Status: None   Collection Time: 09/25/22 11:16 AM  Result Value Ref Range   WBC 5.3 3.4 - 10.8 x10E3/uL   RBC 4.08 3.77 - 5.28 x10E6/uL   Hemoglobin 12.1 11.1 - 15.9 g/dL   Hematocrit 57.3 22.0 - 46.6 %   MCV 90 79 - 97 fL   MCH 29.7 26.6 - 33.0 pg   MCHC 32.8 31.5 - 35.7 g/dL   RDW 25.4 27.0 - 62.3 %   Platelets 181 150 - 450 x10E3/uL   Neutrophils 74 Not Estab. %   Lymphs 15 Not Estab. %   Monocytes 7 Not Estab. %   Eos 3 Not Estab. %   Basos 1 Not Estab. %   Neutrophils Absolute 3.9 1.4 - 7.0 x10E3/uL   Lymphocytes Absolute 0.8 0.7 - 3.1 x10E3/uL   Monocytes Absolute 0.4 0.1 - 0.9 x10E3/uL   EOS (ABSOLUTE) 0.1 0.0 - 0.4 x10E3/uL   Basophils Absolute 0.0 0.0 - 0.2 x10E3/uL   Immature Granulocytes 0 Not Estab. %   Immature Grans (Abs) 0.0 0.0 - 0.1 x10E3/uL  Basic metabolic panel     Status: None   Collection Time: 10/26/22  2:21 PM  Result Value Ref Range   Glucose 96 70 - 99 mg/dL   BUN 20 8 - 27 mg/dL   Creatinine, Ser 7.62 0.57 - 1.00 mg/dL   eGFR 73 >83 TD/VVO/1.60   BUN/Creatinine Ratio 23 12 - 28   Sodium 141 134 - 144 mmol/L   Potassium 4.5 3.5 - 5.2 mmol/L   Chloride 103 96 - 106 mmol/L   CO2 21 20 - 29 mmol/L   Calcium 9.5 8.7 - 10.3 mg/dL  VPX+T0G+[id number]     Status: None   Collection Time: 10/26/22  2:21 PM  Result Value Ref Range   TSH 3.460 0.450 - 4.500 uIU/mL   T3, Free 2.3 2.0 - 4.4 pg/mL   Free T4 1.29 0.82 - 1.77 ng/dL      Assessment & Plan:  Continue all medications.  Monitor blood pressure. Problem List Items Addressed This Visit     GAD (generalized anxiety disorder)   Hypothyroidism - Primary   Essential hypertension, benign   Mixed hyperlipidemia   Primary vitiligo    Return in about 2 months (around 12/27/2022).   Total time spent: 25 minutes  Margaretann Loveless,  MD  10/27/2022   This document may have been prepared by [facility] Voice Recognition software and as such may include unintentional dictation errors.

## 2022-11-20 ENCOUNTER — Other Ambulatory Visit: Payer: Self-pay | Admitting: Dermatology

## 2022-11-20 DIAGNOSIS — L80 Vitiligo: Secondary | ICD-10-CM

## 2022-11-25 ENCOUNTER — Other Ambulatory Visit: Payer: Self-pay | Admitting: Psychiatry

## 2022-11-25 DIAGNOSIS — F411 Generalized anxiety disorder: Secondary | ICD-10-CM

## 2022-11-25 DIAGNOSIS — F332 Major depressive disorder, recurrent severe without psychotic features: Secondary | ICD-10-CM

## 2022-12-16 ENCOUNTER — Emergency Department
Admission: EM | Admit: 2022-12-16 | Discharge: 2022-12-16 | Disposition: A | Discharge status: Home / Self Care | Payer: Medicare HMO | Attending: Emergency Medicine | Admitting: Emergency Medicine

## 2022-12-16 ENCOUNTER — Emergency Department: Payer: Medicare HMO

## 2022-12-16 ENCOUNTER — Other Ambulatory Visit: Payer: Self-pay

## 2022-12-16 DIAGNOSIS — N200 Calculus of kidney: Secondary | ICD-10-CM | POA: Diagnosis not present

## 2022-12-16 DIAGNOSIS — R109 Unspecified abdominal pain: Secondary | ICD-10-CM | POA: Diagnosis not present

## 2022-12-16 DIAGNOSIS — N132 Hydronephrosis with renal and ureteral calculous obstruction: Secondary | ICD-10-CM | POA: Insufficient documentation

## 2022-12-16 LAB — COMPREHENSIVE METABOLIC PANEL
ALT: 32 U/L (ref 0–44)
AST: 33 U/L (ref 15–41)
Albumin: 4.8 g/dL (ref 3.5–5.0)
Alkaline Phosphatase: 79 U/L (ref 38–126)
Anion gap: 11 (ref 5–15)
BUN: 19 mg/dL (ref 8–23)
CO2: 23 mmol/L (ref 22–32)
Calcium: 9.2 mg/dL (ref 8.9–10.3)
Chloride: 104 mmol/L (ref 98–111)
Creatinine, Ser: 1.13 mg/dL — ABNORMAL HIGH (ref 0.44–1.00)
GFR, Estimated: 53 mL/min — ABNORMAL LOW (ref >60)
Glucose, Bld: 111 mg/dL — ABNORMAL HIGH (ref 70–99)
Potassium: 3.5 mmol/L (ref 3.5–5.1)
Sodium: 138 mmol/L (ref 135–145)
Total Bilirubin: 0.9 mg/dL (ref 0.3–1.2)
Total Protein: 8.4 g/dL — ABNORMAL HIGH (ref 6.5–8.1)

## 2022-12-16 LAB — URINALYSIS, ROUTINE W REFLEX MICROSCOPIC
Bacteria, UA: NONE SEEN
Bilirubin Urine: NEGATIVE
Glucose, UA: NEGATIVE mg/dL
Ketones, ur: NEGATIVE mg/dL
Nitrite: NEGATIVE
Protein, ur: NEGATIVE mg/dL
Specific Gravity, Urine: 1.018 (ref 1.005–1.030)
pH: 6 (ref 5.0–8.0)

## 2022-12-16 LAB — CBC WITH DIFFERENTIAL/PLATELET
Abs Immature Granulocytes: 0.03 10*3/uL (ref 0.00–0.07)
Basophils Absolute: 0 10*3/uL (ref 0.0–0.1)
Basophils Relative: 1 %
Eosinophils Absolute: 0.1 10*3/uL (ref 0.0–0.5)
Eosinophils Relative: 1 %
HCT: 41.2 % (ref 36.0–46.0)
Hemoglobin: 13.6 g/dL (ref 12.0–15.0)
Immature Granulocytes: 0 %
Lymphocytes Relative: 15 %
Lymphs Abs: 1.1 10*3/uL (ref 0.7–4.0)
MCH: 30 pg (ref 26.0–34.0)
MCHC: 33 g/dL (ref 30.0–36.0)
MCV: 90.7 fL (ref 80.0–100.0)
Monocytes Absolute: 0.4 10*3/uL (ref 0.1–1.0)
Monocytes Relative: 5 %
Neutro Abs: 5.9 10*3/uL (ref 1.7–7.7)
Neutrophils Relative %: 78 %
Platelets: 222 10*3/uL (ref 150–400)
RBC: 4.54 MIL/uL (ref 3.87–5.11)
RDW: 14 % (ref 11.5–15.5)
WBC: 7.5 10*3/uL (ref 4.0–10.5)
nRBC: 0 % (ref 0.0–0.2)

## 2022-12-16 LAB — LIPASE, BLOOD: Lipase: 31 U/L (ref 11–51)

## 2022-12-16 MED ORDER — CEPHALEXIN 500 MG PO CAPS: 1000.0000 mg | ORAL_CAPSULE | Freq: Two times a day (BID) | ORAL | 0 refills | Status: DC

## 2022-12-16 MED ORDER — MORPHINE SULFATE (PF) 4 MG/ML IV SOLN
4.0000 mg | Freq: Once | INTRAVENOUS | Status: AC
  Administered 2022-12-16: 4 mg via INTRAVENOUS
  Filled 2022-12-16: qty 1

## 2022-12-16 MED ORDER — KETOROLAC TROMETHAMINE 30 MG/ML IJ SOLN: 30.0000 mg | Freq: Once | INTRAMUSCULAR | Status: DC

## 2022-12-16 MED ORDER — CEFTRIAXONE SODIUM 1 G IJ SOLR
1.0000 g | Freq: Once | INTRAMUSCULAR | Status: AC
  Administered 2022-12-16: 1 g via INTRAMUSCULAR
  Filled 2022-12-16: qty 10

## 2022-12-16 MED ORDER — OXYCODONE-ACETAMINOPHEN 5-325 MG PO TABS: 1.0000 | ORAL_TABLET | Freq: Four times a day (QID) | ORAL | 0 refills | Status: DC | PRN

## 2022-12-16 MED ORDER — TAMSULOSIN HCL 0.4 MG PO CAPS
0.4000 mg | ORAL_CAPSULE | Freq: Once | ORAL | Status: AC
  Administered 2022-12-16: 0.4 mg via ORAL
  Filled 2022-12-16: qty 1

## 2022-12-16 MED ORDER — ONDANSETRON 4 MG PO TBDP: 4.0000 mg | ORAL_TABLET | Freq: Three times a day (TID) | ORAL | 0 refills | Status: DC | PRN

## 2022-12-16 MED ORDER — ONDANSETRON HCL 4 MG/2ML IJ SOLN
4.0000 mg | Freq: Once | INTRAMUSCULAR | Status: AC
  Administered 2022-12-16: 4 mg via INTRAVENOUS
  Filled 2022-12-16: qty 2

## 2022-12-16 MED ORDER — KETOROLAC TROMETHAMINE 10 MG PO TABS: 10.0000 mg | ORAL_TABLET | Freq: Four times a day (QID) | ORAL | 0 refills | Status: DC | PRN

## 2022-12-16 MED ORDER — KETOROLAC TROMETHAMINE 30 MG/ML IJ SOLN
15.0000 mg | Freq: Once | INTRAMUSCULAR | Status: AC
  Administered 2022-12-16: 15 mg via INTRAVENOUS
  Filled 2022-12-16: qty 1

## 2022-12-16 MED ORDER — TAMSULOSIN HCL 0.4 MG PO CAPS: 0.4000 mg | ORAL_CAPSULE | Freq: Every day | ORAL | 0 refills | Status: DC

## 2022-12-16 NOTE — ED Triage Notes (Signed)
Pt sts that she has been extreme lower right back pain for the last two hours.

## 2022-12-16 NOTE — ED Provider Notes (Signed)
[facility] Provider Note  Patient Contact: 3:38 PM (approximate)   History   Back Pain   HPI  Shannon Crawford is a 67 y.o. female who presents emergency department planing of sharp right back/flank pain.  This began today.  She has a history of back issues with sciatica but this feels different.  Patient states it feels higher and more on her side.  No vomiting or diarrhea though patient does have some nausea.  Patient denies any fevers or chills.  No dysuria, polyuria or hematuria.  Patient has no diarrhea or constipation.  She denies any history of nephrolithiasis.  Both her gallbladder and appendix is still in place.     Physical Exam   Triage Vital Signs: ED Triage Vitals  Encounter Vitals Group     BP 12/16/22 1430 (!) 157/111     Systolic BP Percentile --      Diastolic BP Percentile --      Pulse Rate 12/16/22 1430 82     Resp 12/16/22 1430 19     Temp 12/16/22 1430 98.5 F (36.9 C)     Temp Source 12/16/22 1430 Oral     SpO2 12/16/22 1430 98 %     Weight 12/16/22 1429 190 lb (86.2 kg)     Height 12/16/22 1429 5\' 3"  (1.6 m)     Head Circumference --      Peak Flow --      Pain Score 12/16/22 1428 8     Pain Loc --      Pain Education --      Exclude from Growth Chart --     Most recent vital signs: Vitals:   12/16/22 1930 12/16/22 1940  BP: (!) 162/79   Pulse: 85   Resp:  15  Temp:    SpO2:  97%     General: Alert and in no acute distress  Cardiovascular:  Good peripheral perfusion Respiratory: Normal respiratory effort without tachypnea or retractions. Lungs CTAB. Good air entry to the bases with no decreased or absent breath sounds. Gastrointestinal: Bowel sounds 4 quadrants. Soft and nontender to palpation. No guarding or rigidity. No palpable masses. No distention.  Right side CVA tenderness. Musculoskeletal: Full range of motion to all extremities.  Neurologic:  No gross focal neurologic deficits are appreciated.  Skin:    No rash noted Other:   ED Results / Procedures / Treatments   Labs (all labs ordered are listed, but only abnormal results are displayed) Labs Reviewed  COMPREHENSIVE METABOLIC PANEL - Abnormal; Notable for the following components:      Result Value   Glucose, Bld 111 (*)    Creatinine, Ser 1.13 (*)    Total Protein 8.4 (*)    GFR, Estimated 53 (*)    All other components within normal limits  URINALYSIS, ROUTINE W REFLEX MICROSCOPIC - Abnormal; Notable for the following components:   Color, Urine YELLOW (*)    APPearance HAZY (*)    Hgb urine dipstick SMALL (*)    Leukocytes,Ua MODERATE (*)    All other components within normal limits  LIPASE, BLOOD  CBC WITH DIFFERENTIAL/PLATELET     EKG     RADIOLOGY  I personally viewed, evaluated, and interpreted these images as part of my medical decision making, as well as reviewing the written report by the radiologist.  ED Provider Interpretation: 4 mm nephrolithiasis visualized in the right proximal ureter with mild hydronephrosis.  No perinephric stranding.  CT  Renal Stone Study  Result Date: 12/16/2022 CLINICAL DATA:  Right-sided flank pain and abdominal pain. EXAM: CT ABDOMEN AND PELVIS WITHOUT CONTRAST TECHNIQUE: Multidetector CT imaging of the abdomen and pelvis was performed following the standard protocol without IV contrast. RADIATION DOSE REDUCTION: This exam was performed according to the departmental dose-optimization program which includes automated exposure control, adjustment of the mA and/or kV according to patient size and/or use of iterative reconstruction technique. COMPARISON:  None Available. FINDINGS: Lower chest: No acute abnormality. Hepatobiliary: No focal liver abnormality is seen. No gallstones, gallbladder wall thickening, or biliary dilatation. Pancreas: Unremarkable. No pancreatic ductal dilatation or surrounding inflammatory changes. Spleen: Normal in size without focal abnormality. Adrenals/Urinary  Tract: There is a 4 mm calculus in the proximal right ureter. There is mild right-sided hydronephrosis with perinephric fat stranding. There is a small focus of air in the bladder. The bladder is otherwise within normal limits. The left kidney and adrenal glands are within normal limits. Stomach/Bowel: Stomach is within normal limits. Appendix appears normal. No evidence of bowel wall thickening, distention, or inflammatory changes. Vascular/Lymphatic: Aortic atherosclerosis. No enlarged abdominal or pelvic lymph nodes. Reproductive: Uterus and bilateral adnexa are unremarkable. Other: No abdominal wall hernia or abnormality. No abdominopelvic ascites. There surgical clips in the anterior abdominal wall. Musculoskeletal: No acute or significant osseous findings. IMPRESSION: 1. 4 mm calculus in the proximal right ureter with mild obstructive uropathy. 2. Small focus of air in the bladder. Correlate for recent instrumentation or infection. Aortic Atherosclerosis (ICD10-I70.0). Electronically Signed   By: Darliss Cheney M.D.   On: 12/16/2022 17:12    PROCEDURES:  Critical Care performed: No  Procedures   MEDICATIONS ORDERED IN ED: Medications  cefTRIAXone (ROCEPHIN) injection 1 g (has no administration in time range)  morphine (PF) 4 MG/ML injection 4 mg (4 mg Intravenous Given 12/16/22 1553)  ondansetron (ZOFRAN) injection 4 mg (4 mg Intravenous Given 12/16/22 1552)  ondansetron (ZOFRAN) injection 4 mg (4 mg Intravenous Given 12/16/22 1732)  morphine (PF) 4 MG/ML injection 4 mg (4 mg Intravenous Given 12/16/22 1734)  morphine (PF) 4 MG/ML injection 4 mg (4 mg Intravenous Given 12/16/22 1928)  tamsulosin (FLOMAX) capsule 0.4 mg (0.4 mg Oral Given 12/16/22 1928)  ketorolac (TORADOL) 30 MG/ML injection 15 mg (15 mg Intravenous Given 12/16/22 1928)     IMPRESSION / MDM / ASSESSMENT AND PLAN / ED COURSE  I reviewed the triage vital signs and the nursing notes.                                  Differential diagnosis includes, but is not limited to, kidney stone, colitis, sciatica, lumbago, appendicitis, cholecystitis  Patient's presentation is most consistent with acute presentation with potential threat to life or bodily function.   Patient's diagnosis is consistent with nephrolithiasis.  Patient presents to the emergency department with right flank pain.  No other significant symptoms.  No radiation of pain, no GI symptoms or urinary symptoms at this time.  Patient has findings on CT scan consistent with nephrolithiasis.  Patient's labs are reassuring.  There is slight amount of leukocytes with her urine and I will cover her with Rocephin here and antibiotics at home.  The patient was given Toradol, pain medication and antiemetics here.  Patient will prescription for Flomax, pain medication, antibiotics at home.  Follow-up with urology as needed..  Patient is given ED precautions to return to the ED for  any worsening or new symptoms.     FINAL CLINICAL IMPRESSION(S) / ED DIAGNOSES   Final diagnoses:  Nephrolithiasis     Rx / DC Orders   ED Discharge Orders          Ordered    oxyCODONE-acetaminophen (PERCOCET/ROXICET) 5-325 MG tablet  Every 6 hours PRN        12/16/22 1948    ketorolac (TORADOL) 10 MG tablet  Every 6 hours PRN        12/16/22 1948    tamsulosin (FLOMAX) 0.4 MG CAPS capsule  Daily        12/16/22 1948    cephALEXin (KEFLEX) 500 MG capsule  2 times daily        12/16/22 1948    ondansetron (ZOFRAN-ODT) 4 MG disintegrating tablet  Every 8 hours PRN        12/16/22 1950             Note:  This document was prepared using Dragon voice recognition software and may include unintentional dictation errors.   Lanette Hampshire 12/16/22 1951    Minna Antis, MD 12/17/22 2258

## 2022-12-16 NOTE — ED Notes (Signed)
Provider at bedside

## 2022-12-16 NOTE — ED Notes (Signed)
Pt is vomiting, provided emesis bag, informed provider. Pt states pain is to R lower back and wraps around flank. Pain is dull. Pt denies hematuria, dysuria and hx of renal stones. Husband at bedside.

## 2022-12-16 NOTE — ED Notes (Signed)
Pt resting comfortably at this time.

## 2022-12-20 ENCOUNTER — Encounter: Payer: Self-pay | Admitting: Urology

## 2022-12-20 ENCOUNTER — Ambulatory Visit
Admission: RE | Admit: 2022-12-20 | Discharge: 2022-12-20 | Disposition: A | Discharge status: Home / Self Care | Payer: Medicare HMO | Attending: Urology | Admitting: Urology

## 2022-12-20 ENCOUNTER — Ambulatory Visit: Payer: Medicare HMO | Admitting: Urology

## 2022-12-20 ENCOUNTER — Ambulatory Visit
Admission: RE | Admit: 2022-12-20 | Discharge: 2022-12-20 | Disposition: A | Discharge status: Home / Self Care | Payer: Medicare HMO | Source: Ambulatory Visit | Attending: Urology | Admitting: Urology

## 2022-12-20 VITALS — BP 145/81 | HR 88 | Ht 63.0 in | Wt 195.0 lb

## 2022-12-20 DIAGNOSIS — I878 Other specified disorders of veins: Secondary | ICD-10-CM | POA: Diagnosis not present

## 2022-12-20 DIAGNOSIS — N200 Calculus of kidney: Secondary | ICD-10-CM | POA: Diagnosis not present

## 2022-12-20 DIAGNOSIS — N132 Hydronephrosis with renal and ureteral calculous obstruction: Secondary | ICD-10-CM

## 2022-12-20 DIAGNOSIS — N201 Calculus of ureter: Secondary | ICD-10-CM

## 2022-12-20 DIAGNOSIS — N1330 Unspecified hydronephrosis: Secondary | ICD-10-CM

## 2022-12-20 DIAGNOSIS — N23 Unspecified renal colic: Secondary | ICD-10-CM

## 2022-12-20 LAB — URINALYSIS, COMPLETE
Bilirubin, UA: NEGATIVE
Glucose, UA: NEGATIVE
Ketones, UA: NEGATIVE
Nitrite, UA: NEGATIVE
Specific Gravity, UA: 1.015 (ref 1.005–1.030)
Urobilinogen, Ur: 0.2 mg/dL (ref 0.2–1.0)
pH, UA: 7 (ref 5.0–7.5)

## 2022-12-20 LAB — MICROSCOPIC EXAMINATION

## 2022-12-22 ENCOUNTER — Telehealth: Payer: Self-pay | Admitting: Urology

## 2022-12-22 ENCOUNTER — Encounter: Payer: Self-pay | Admitting: Urology

## 2022-12-22 NOTE — Telephone Encounter (Signed)
Advised patient on voice mail onces the doctor has read the report we will give her a call.

## 2022-12-22 NOTE — Telephone Encounter (Signed)
PT LMOM she had KUB done 10/30 and was expecting a call to let her know if she passed her stone or if it had moved.

## 2022-12-23 LAB — CULTURE, URINE COMPREHENSIVE

## 2022-12-26 ENCOUNTER — Encounter: Payer: Self-pay | Admitting: Urology

## 2022-12-28 ENCOUNTER — Encounter: Payer: Self-pay | Admitting: Internal Medicine

## 2022-12-28 ENCOUNTER — Ambulatory Visit (INDEPENDENT_AMBULATORY_CARE_PROVIDER_SITE_OTHER): Payer: Medicare HMO | Admitting: Internal Medicine

## 2022-12-28 VITALS — BP 152/98 | HR 77 | Ht 63.0 in | Wt 200.2 lb

## 2022-12-28 DIAGNOSIS — Z1231 Encounter for screening mammogram for malignant neoplasm of breast: Secondary | ICD-10-CM

## 2022-12-28 DIAGNOSIS — E782 Mixed hyperlipidemia: Secondary | ICD-10-CM | POA: Diagnosis not present

## 2022-12-28 DIAGNOSIS — E039 Hypothyroidism, unspecified: Secondary | ICD-10-CM | POA: Diagnosis not present

## 2022-12-28 DIAGNOSIS — I10 Essential (primary) hypertension: Secondary | ICD-10-CM

## 2022-12-28 DIAGNOSIS — R7303 Prediabetes: Secondary | ICD-10-CM | POA: Insufficient documentation

## 2022-12-28 MED ORDER — MOUNJARO 2.5 MG/0.5ML ~~LOC~~ SOAJ: 2.5000 mg | SUBCUTANEOUS | 0 refills | Status: DC

## 2022-12-28 MED ORDER — AMLODIPINE BESYLATE 5 MG PO TABS: 5.0000 mg | ORAL_TABLET | Freq: Every day | ORAL | 11 refills | Status: DC

## 2022-12-28 NOTE — Progress Notes (Signed)
Established Patient Office Visit  Subjective:  Patient ID: Shannon Crawford, female    DOB: Oct 08, 1955  Age: 67 y.o. MRN: [id number]  Chief Complaint  Patient presents with   Follow-up    2 month follow up    Patient comes in for follow-up today.  She has gained some weight and her blood pressure is high.  She was recently diagnosed with a 4 mm right-sided kidney stone and treated with antibiotics and Flomax.  She has seen the urologist.  But she is not sure if she has actually passed the stone yet or not.  Repeat  x-ray of the abdomen was done but results not available yet. Patient admits to stress eating, weight gain and a rise in blood pressure.  She is currently on Hyzaar 100/25 mg/day.  Agrees to start Norvasc 5 mg p.o. daily.   She is also a prediabetic. Will try to use Mounjaro if approved by her insurance to help her lose weight and control her blood sugar levels. She will return fasting for blood work. Also needs to schedule mammogram. She is up-to-date on her flu and COVID vaccines.     No other concerns at this time.   Past Medical History:  Diagnosis Date   Anxiety    Fracture    ankle   Heart murmur    Hypothyroid    Impaired glucose tolerance    Mixed hyperlipidemia    OSA on CPAP    Primary vitiligo    Thyroid disease     Past Surgical History:  Procedure Laterality Date   ABDOMINAL SURGERY     tummy tuck   BREAST BIOPSY Left 10+ years   Negative- core bx    Social History   Socioeconomic History   Marital status: Married    Spouse name: John   Number of children: 2   Years of education: Not on file   Highest education level: Some college, no degree  Occupational History   Not on file  Tobacco Use   Smoking status: Never   Smokeless tobacco: Never  Vaping Use   Vaping status: Never Used  Substance and Sexual Activity   Alcohol use: Yes    Alcohol/week: 12.0 standard drinks of alcohol    Types: 12 Cans of beer per week   Drug use: No    Sexual activity: Yes  Other Topics Concern   Not on file  Social History Narrative   Not on file   Social Determinants of Health   Financial Resource Strain: Not on file  Food Insecurity: Not on file  Transportation Needs: Not on file  Physical Activity: Not on file  Stress: Not on file  Social Connections: Not on file  Intimate Partner Violence: Not on file    Family History  Problem Relation Age of Onset   Hypertension Mother    Alcohol abuse Father    Anxiety disorder Sister    Prostate cancer Brother    Hypertension Sister    Breast cancer Sister 54   Breast cancer Cousin     Allergies  Allergen Reactions   Catfish [Fish Allergy] Nausea And Vomiting    Review of Systems  Constitutional: Negative.  Negative for chills, fever, malaise/fatigue and weight loss.  HENT: Negative.    Eyes: Negative.   Respiratory: Negative.  Negative for cough and shortness of breath.   Cardiovascular: Negative.  Negative for chest pain, palpitations and leg swelling.  Gastrointestinal: Negative.  Negative for abdominal pain, blood  in stool, constipation, diarrhea, heartburn, nausea and vomiting.  Genitourinary: Negative.  Negative for dysuria and flank pain.  Musculoskeletal: Negative.  Negative for back pain, joint pain and myalgias.  Skin: Negative.   Neurological: Negative.  Negative for dizziness and headaches.  Endo/Heme/Allergies: Negative.   Psychiatric/Behavioral: Negative.  Negative for depression and suicidal ideas. The patient is not nervous/anxious.        Objective:   BP (!) 152/98   Pulse 77   Ht 5\' 3"  (1.6 m)   Wt 200 lb 3.2 oz (90.8 kg)   SpO2 97%   BMI 35.46 kg/m   Vitals:   12/28/22 1312  BP: (!) 152/98  Pulse: 77  Height: 5\' 3"  (1.6 m)  Weight: 200 lb 3.2 oz (90.8 kg)  SpO2: 97%  BMI (Calculated): 35.47    Physical Exam Vitals and nursing note reviewed.  Constitutional:      Appearance: Normal appearance.  HENT:     Head: Normocephalic and  atraumatic.     Nose: Nose normal.     Mouth/Throat:     Mouth: Mucous membranes are moist.     Pharynx: Oropharynx is clear.  Eyes:     Conjunctiva/sclera: Conjunctivae normal.     Pupils: Pupils are equal, round, and reactive to light.  Cardiovascular:     Rate and Rhythm: Normal rate and regular rhythm.     Pulses: Normal pulses.     Heart sounds: Normal heart sounds. No murmur heard. Pulmonary:     Effort: Pulmonary effort is normal.     Breath sounds: Normal breath sounds. No wheezing.  Abdominal:     General: Bowel sounds are normal.     Palpations: Abdomen is soft.     Tenderness: There is no abdominal tenderness. There is no right CVA tenderness or left CVA tenderness.  Musculoskeletal:        General: Normal range of motion.     Cervical back: Normal range of motion.     Right lower leg: No edema.     Left lower leg: No edema.  Skin:    General: Skin is warm and dry.  Neurological:     General: No focal deficit present.     Mental Status: She is alert and oriented to person, place, and time.  Psychiatric:        Mood and Affect: Mood normal.        Behavior: Behavior normal.      No results found for any visits on 12/28/22.  Recent Results (from the past 2160 hour(s))  Basic metabolic panel     Status: None   Collection Time: 10/26/22  2:21 PM  Result Value Ref Range   Glucose 96 70 - 99 mg/dL   BUN 20 8 - 27 mg/dL   Creatinine, Ser 4.09 0.57 - 1.00 mg/dL   eGFR 73 >81 XB/JYN/8.29   BUN/Creatinine Ratio 23 12 - 28   Sodium 141 134 - 144 mmol/L   Potassium 4.5 3.5 - 5.2 mmol/L   Chloride 103 96 - 106 mmol/L   CO2 21 20 - 29 mmol/L   Calcium 9.5 8.7 - 10.3 mg/dL  FAO+[id number]     Status: None   Collection Time: 10/26/22  2:21 PM  Result Value Ref Range   TSH 3.460 0.450 - 4.500 uIU/mL   T3, Free 2.3 2.0 - 4.4 pg/mL   Free T4 1.29 0.82 - 1.77 ng/dL  Comprehensive metabolic panel     Status: Abnormal  Collection Time: 12/16/22  3:53 PM  Result Value  Ref Range   Sodium 138 135 - 145 mmol/L   Potassium 3.5 3.5 - 5.1 mmol/L   Chloride 104 98 - 111 mmol/L   CO2 23 22 - 32 mmol/L   Glucose, Bld 111 (H) 70 - 99 mg/dL    Comment: Glucose reference range applies only to samples taken after fasting for at least 8 hours.   BUN 19 8 - 23 mg/dL   Creatinine, Ser 1.61 (H) 0.44 - 1.00 mg/dL   Calcium 9.2 8.9 - 09.6 mg/dL   Total Protein 8.4 (H) 6.5 - 8.1 g/dL   Albumin 4.8 3.5 - 5.0 g/dL   AST 33 15 - 41 U/L   ALT 32 0 - 44 U/L   Alkaline Phosphatase 79 38 - 126 U/L   Total Bilirubin 0.9 0.3 - 1.2 mg/dL   GFR, Estimated 53 (L) >60 mL/min    Comment: (NOTE) Calculated using the CKD-EPI Creatinine Equation (2021)    Anion gap 11 5 - 15    Comment: Performed at [facility], [address] [place], [place] [address]  Lipase, blood     Status: None   Collection Time: 12/16/22  3:53 PM  Result Value Ref Range   Lipase 31 11 - 51 U/L    Comment: Performed at [facility], [address] [place], [place] [address]  CBC with Differential     Status: None   Collection Time: 12/16/22  3:53 PM  Result Value Ref Range   WBC 7.5 4.0 - 10.5 K/uL   RBC 4.54 3.87 - 5.11 MIL/uL   Hemoglobin 13.6 12.0 - 15.0 g/dL   HCT 14.7 82.9 - 56.2 %   MCV 90.7 80.0 - 100.0 fL   MCH 30.0 26.0 - 34.0 pg   MCHC 33.0 30.0 - 36.0 g/dL   RDW 13.0 86.5 - 78.4 %   Platelets 222 150 - 400 K/uL   nRBC 0.0 0.0 - 0.2 %   Neutrophils Relative % 78 %   Neutro Abs 5.9 1.7 - 7.7 K/uL   Lymphocytes Relative 15 %   Lymphs Abs 1.1 0.7 - 4.0 K/uL   Monocytes Relative 5 %   Monocytes Absolute 0.4 0.1 - 1.0 K/uL   Eosinophils Relative 1 %   Eosinophils Absolute 0.1 0.0 - 0.5 K/uL   Basophils Relative 1 %   Basophils Absolute 0.0 0.0 - 0.1 K/uL   Immature Granulocytes 0 %   Abs Immature Granulocytes 0.03 0.00 - 0.07 K/uL    Comment: Performed at [facility], [address] [place], [place] [address]  Urinalysis, Routine w reflex microscopic  -Urine, Clean Catch     Status: Abnormal   Collection Time: 12/16/22  6:39 PM  Result Value Ref Range   Color, Urine YELLOW (A) YELLOW   APPearance HAZY (A) CLEAR   Specific Gravity, Urine 1.018 1.005 - 1.030   pH 6.0 5.0 - 8.0   Glucose, UA NEGATIVE NEGATIVE mg/dL   Hgb urine dipstick SMALL (A) NEGATIVE   Bilirubin Urine NEGATIVE NEGATIVE   Ketones, ur NEGATIVE NEGATIVE mg/dL   Protein, ur NEGATIVE NEGATIVE mg/dL   Nitrite NEGATIVE NEGATIVE   Leukocytes,Ua MODERATE (A) NEGATIVE   RBC / HPF 11-20 0 - 5 RBC/hpf   WBC, UA 21-50 0 - 5 WBC/hpf   Bacteria, UA NONE SEEN NONE SEEN   Squamous Epithelial / HPF 0-5 0 - 5 /HPF   Mucus PRESENT  Comment: Performed at [facility], [address] [place], [place] [address]  Urinalysis, Complete     Status: Abnormal   Collection Time: 12/20/22  9:46 AM  Result Value Ref Range   Specific Gravity, UA 1.015 1.005 - 1.030   pH, UA 7.0 5.0 - 7.5   Color, UA Yellow Yellow   Appearance Ur Clear Clear   Leukocytes,UA Trace (A) Negative   Protein,UA 1+ (A) Negative/Trace   Glucose, UA Negative Negative   Ketones, UA Negative Negative   RBC, UA Trace (A) Negative   Bilirubin, UA Negative Negative   Urobilinogen, Ur 0.2 0.2 - 1.0 mg/dL   Nitrite, UA Negative Negative   Microscopic Examination See below:   Microscopic Examination     Status: Abnormal   Collection Time: 12/20/22  9:46 AM   Urine  Result Value Ref Range   WBC, UA 11-30 (A) 0 - 5 /hpf   RBC, Urine 11-30 (A) 0 - 2 /hpf   Epithelial Cells (non renal) 0-10 0 - 10 /hpf   Casts Present (A) None seen /lpf   Cast Type Granular casts (A) N/A   Bacteria, UA Few None seen/Few  CULTURE, URINE COMPREHENSIVE     Status: None   Collection Time: 12/20/22 11:30 AM   Specimen: Urine   UR  Result Value Ref Range   Urine Culture, Comprehensive Final report    Organism ID, Bacteria Comment     Comment: No growth in 36 - 48 hours.      Assessment & Plan:  Add Norvasc 5 mg p.o.  daily.  Continue other medications and strict diet control. Prescription sent in for [facility] injections 2.5 mg/week. Follow-up with urologist as needed. Problem List Items Addressed This Visit     Hypothyroidism   Relevant Orders   TSH+T4F+T3Free   Essential hypertension, benign - Primary   Relevant Medications   amLODipine (NORVASC) 5 MG tablet   Other Relevant Orders   CBC with Diff   CMP14+EGFR   Mixed hyperlipidemia   Relevant Medications   amLODipine (NORVASC) 5 MG tablet   Other Relevant Orders   Lipid Panel w/o Chol/HDL Ratio   Prediabetes   Relevant Medications   tirzepatide (MOUNJARO) 2.5 MG/0.5ML Pen   Other Relevant Orders   Hemoglobin A1c   Other Visit Diagnoses     Breast cancer screening by mammogram       Relevant Orders   MM 3D SCREENING MAMMOGRAM BILATERAL BREAST       Return in about 2 weeks (around 01/11/2023).   Total time spent: 30 minutes  Margaretann Loveless, MD  12/28/2022   This document may have been prepared by [facility] Voice Recognition software and as such may include unintentional dictation errors.

## 2023-01-02 ENCOUNTER — Encounter: Payer: Self-pay | Admitting: Urology

## 2023-01-04 ENCOUNTER — Other Ambulatory Visit: Payer: Medicare HMO

## 2023-01-04 DIAGNOSIS — R7303 Prediabetes: Secondary | ICD-10-CM | POA: Diagnosis not present

## 2023-01-04 DIAGNOSIS — I10 Essential (primary) hypertension: Secondary | ICD-10-CM | POA: Diagnosis not present

## 2023-01-04 DIAGNOSIS — E782 Mixed hyperlipidemia: Secondary | ICD-10-CM | POA: Diagnosis not present

## 2023-01-04 DIAGNOSIS — E039 Hypothyroidism, unspecified: Secondary | ICD-10-CM | POA: Diagnosis not present

## 2023-01-05 LAB — CMP14+EGFR
ALT: 33 [IU]/L — ABNORMAL HIGH (ref 0–32)
AST: 29 [IU]/L (ref 0–40)
Albumin: 4.8 g/dL (ref 3.9–4.9)
Alkaline Phosphatase: 85 [IU]/L (ref 44–121)
BUN/Creatinine Ratio: 19 (ref 12–28)
BUN: 20 mg/dL (ref 8–27)
Bilirubin Total: 0.3 mg/dL (ref 0.0–1.2)
CO2: 23 mmol/L (ref 20–29)
Calcium: 10 mg/dL (ref 8.7–10.3)
Chloride: 104 mmol/L (ref 96–106)
Creatinine, Ser: 1.04 mg/dL — ABNORMAL HIGH (ref 0.57–1.00)
Globulin, Total: 2.6 g/dL (ref 1.5–4.5)
Glucose: 101 mg/dL — ABNORMAL HIGH (ref 70–99)
Potassium: 4.3 mmol/L (ref 3.5–5.2)
Sodium: 143 mmol/L (ref 134–144)
Total Protein: 7.4 g/dL (ref 6.0–8.5)
eGFR: 58 mL/min/{1.73_m2} — ABNORMAL LOW (ref >59)

## 2023-01-05 LAB — CBC WITH DIFFERENTIAL/PLATELET
Basophils Absolute: 0 10*3/uL (ref 0.0–0.2)
Basos: 1 %
EOS (ABSOLUTE): 0.1 10*3/uL (ref 0.0–0.4)
Eos: 4 %
Hematocrit: 41.1 % (ref 34.0–46.6)
Hemoglobin: 13.4 g/dL (ref 11.1–15.9)
Immature Grans (Abs): 0 10*3/uL (ref 0.0–0.1)
Immature Granulocytes: 0 %
Lymphocytes Absolute: 1.3 10*3/uL (ref 0.7–3.1)
Lymphs: 35 %
MCH: 29.6 pg (ref 26.6–33.0)
MCHC: 32.6 g/dL (ref 31.5–35.7)
MCV: 91 fL (ref 79–97)
Monocytes Absolute: 0.3 10*3/uL (ref 0.1–0.9)
Monocytes: 9 %
Neutrophils Absolute: 1.9 10*3/uL (ref 1.4–7.0)
Neutrophils: 51 %
Platelets: 251 10*3/uL (ref 150–450)
RBC: 4.53 x10E6/uL (ref 3.77–5.28)
RDW: 12.5 % (ref 11.7–15.4)
WBC: 3.7 10*3/uL (ref 3.4–10.8)

## 2023-01-05 LAB — LIPID PANEL W/O CHOL/HDL RATIO
Cholesterol, Total: 180 mg/dL (ref 100–199)
HDL: 62 mg/dL (ref >39)
LDL Chol Calc (NIH): 98 mg/dL (ref 0–99)
Triglycerides: 111 mg/dL (ref 0–149)
VLDL Cholesterol Cal: 20 mg/dL (ref 5–40)

## 2023-01-05 LAB — HEMOGLOBIN A1C
Est. average glucose Bld gHb Est-mCnc: 134 mg/dL
Hgb A1c MFr Bld: 6.3 % — ABNORMAL HIGH (ref 4.8–5.6)

## 2023-01-05 LAB — TSH+T4F+T3FREE
Free T4: 1.51 ng/dL (ref 0.82–1.77)
T3, Free: 3.2 pg/mL (ref 2.0–4.4)
TSH: 0.929 u[IU]/mL (ref 0.450–4.500)

## 2023-01-10 ENCOUNTER — Other Ambulatory Visit: Payer: Self-pay | Admitting: *Deleted

## 2023-01-10 DIAGNOSIS — R1084 Generalized abdominal pain: Secondary | ICD-10-CM

## 2023-01-10 DIAGNOSIS — N201 Calculus of ureter: Secondary | ICD-10-CM

## 2023-01-11 ENCOUNTER — Encounter: Payer: Self-pay | Admitting: Internal Medicine

## 2023-01-11 ENCOUNTER — Ambulatory Visit: Payer: Medicare HMO | Admitting: Internal Medicine

## 2023-01-11 VITALS — BP 130/80 | HR 86 | Ht 63.0 in | Wt 200.2 lb

## 2023-01-11 DIAGNOSIS — Z0000 Encounter for general adult medical examination without abnormal findings: Secondary | ICD-10-CM | POA: Diagnosis not present

## 2023-01-11 DIAGNOSIS — E782 Mixed hyperlipidemia: Secondary | ICD-10-CM | POA: Diagnosis not present

## 2023-01-11 DIAGNOSIS — E039 Hypothyroidism, unspecified: Secondary | ICD-10-CM | POA: Diagnosis not present

## 2023-01-11 DIAGNOSIS — R7303 Prediabetes: Secondary | ICD-10-CM | POA: Diagnosis not present

## 2023-01-11 DIAGNOSIS — I10 Essential (primary) hypertension: Secondary | ICD-10-CM

## 2023-01-11 MED ORDER — MOUNJARO 5 MG/0.5ML ~~LOC~~ SOAJ: 5.0000 mg | SUBCUTANEOUS | 0 refills | Status: DC

## 2023-01-11 NOTE — Progress Notes (Signed)
Established Patient Office Visit  Subjective:  Patient ID: Shannon Crawford, female    DOB: [birth year]  Age: 67 y.o. MRN: [id number]  Chief Complaint  Patient presents with   Annual Exam    AWV    Patient comes in for her annual wellness visit.  She is feeling well and has no new complaints.  She is taking all her medications regularly.  She just started her Mounjaro injection 2.5 mg/week and took her first dose.  So far it is tolerated well.  Will continue this dose for 3 more injections and then we will send in a prescription for the next dose up. Patient is up-to-date on her Cologuard, DEXA scan and vaccinations. Her mammogram is due in December and will be scheduled. Her CFS score is 0. PHQ-9/GAD score is 7/2.    No other concerns at this time.   Past Medical History:  Diagnosis Date   Anxiety    Fracture    ankle   Heart murmur    Hypothyroid    Impaired glucose tolerance    Mixed hyperlipidemia    OSA on CPAP    Primary vitiligo    Thyroid disease     Past Surgical History:  Procedure Laterality Date   ABDOMINAL SURGERY     tummy tuck   BREAST BIOPSY Left 10+ years   Negative- core bx    Social History   Socioeconomic History   Marital status: Married    Spouse name: John   Number of children: 2   Years of education: Not on file   Highest education level: Some college, no degree  Occupational History   Not on file  Tobacco Use   Smoking status: Never   Smokeless tobacco: Never  Vaping Use   Vaping status: Never Used  Substance and Sexual Activity   Alcohol use: Yes    Alcohol/week: 12.0 standard drinks of alcohol    Types: 12 Cans of beer per week   Drug use: No   Sexual activity: Yes  Other Topics Concern   Not on file  Social History Narrative   Not on file   Social Determinants of Health   Financial Resource Strain: Not on file  Food Insecurity: No Food Insecurity (01/11/2023)   Hunger Vital Sign    Worried About Running Out of Food in  the Last Year: Never true    Ran Out of Food in the Last Year: Never true  Transportation Needs: No Transportation Needs (01/11/2023)   PRAPARE - Administrator, Civil Service (Medical): No    Lack of Transportation (Non-Medical): No  Physical Activity: Not on file  Stress: Not on file  Social Connections: Not on file  Intimate Partner Violence: Not At Risk (01/11/2023)   Humiliation, Afraid, Rape, and Kick questionnaire    Fear of Current or Ex-Partner: No    Emotionally Abused: No    Physically Abused: No    Sexually Abused: No    Family History  Problem Relation Age of Onset   Hypertension Mother    Alcohol abuse Father    Anxiety disorder Sister    Prostate cancer Brother    Hypertension Sister    Breast cancer Sister 26   Breast cancer Cousin     Allergies  Allergen Reactions   Catfish [Fish Allergy] Nausea And Vomiting    Outpatient Medications Prior to Visit  Medication Sig   amLODipine (NORVASC) 5 MG tablet Take 1 tablet (5 mg total)  by mouth daily.   buPROPion (WELLBUTRIN) 75 MG tablet TAKE 1 TABLET BY MOUTH DAILY WITH BREAKFAST   Cholecalciferol (D-3-5) 125 MCG (5000 UT) capsule Take 5,000 Units by mouth daily.   DULoxetine (CYMBALTA) 20 MG capsule TAKE 2 CAPSULES BY MOUTH DAILY   levothyroxine (SYNTHROID) 112 MCG tablet Take 1 tablet (112 mcg total) by mouth daily.   losartan-hydrochlorothiazide (HYZAAR) 100-25 MG tablet TAKE 1 TABLET BY MOUTH DAILY   rosuvastatin (CRESTOR) 20 MG tablet 20 mg daily.   zolpidem (AMBIEN) 5 MG tablet Take 1 tablet (5 mg total) by mouth at bedtime as needed for sleep.   [DISCONTINUED] tirzepatide [facility]) 2.5 MG/0.5ML Pen Inject 2.5 mg into the skin once a week.   B Complex-C (SUPER B COMPLEX PO) Take by mouth. (Patient not taking: Reported on 12/28/2022)   No facility-administered medications prior to visit.    Review of Systems  Constitutional: Negative.  Negative for chills, diaphoresis, fever, malaise/fatigue  and weight loss.  HENT: Negative.  Negative for congestion and sore throat.   Eyes: Negative.  Negative for blurred vision and double vision.  Respiratory: Negative.  Negative for cough, shortness of breath and stridor.   Cardiovascular: Negative.  Negative for chest pain, palpitations and leg swelling.  Gastrointestinal: Negative.  Negative for abdominal pain, constipation, diarrhea, heartburn, nausea and vomiting.  Genitourinary: Negative.  Negative for dysuria and flank pain.  Musculoskeletal: Negative.  Negative for falls, joint pain and myalgias.  Skin: Negative.  Negative for itching and rash.  Neurological: Negative.  Negative for dizziness, tingling, tremors, sensory change and headaches.  Endo/Heme/Allergies: Negative.   Psychiatric/Behavioral: Negative.  Negative for depression and suicidal ideas. The patient is not nervous/anxious.        Objective:   BP 130/80   Pulse 86   Ht 5\' 3"  (1.6 m)   Wt 200 lb 3.2 oz (90.8 kg)   SpO2 96%   BMI 35.46 kg/m   Vitals:   01/11/23 1319  BP: 130/80  Pulse: 86  Height: 5\' 3"  (1.6 m)  Weight: 200 lb 3.2 oz (90.8 kg)  SpO2: 96%  BMI (Calculated): 35.47    Physical Exam Vitals and nursing note reviewed.  Constitutional:      Appearance: Normal appearance.  HENT:     Head: Normocephalic and atraumatic.     Nose: Nose normal.     Mouth/Throat:     Mouth: Mucous membranes are moist.     Pharynx: Oropharynx is clear.  Eyes:     Conjunctiva/sclera: Conjunctivae normal.     Pupils: Pupils are equal, round, and reactive to light.  Cardiovascular:     Rate and Rhythm: Normal rate and regular rhythm.     Pulses: Normal pulses.     Heart sounds: Normal heart sounds. No murmur heard. Pulmonary:     Effort: Pulmonary effort is normal.     Breath sounds: Normal breath sounds. No wheezing.  Abdominal:     General: Bowel sounds are normal.     Palpations: Abdomen is soft.     Tenderness: There is no abdominal tenderness. There is  no right CVA tenderness or left CVA tenderness.  Musculoskeletal:        General: Normal range of motion.     Cervical back: Normal range of motion.     Right lower leg: No edema.     Left lower leg: No edema.  Skin:    General: Skin is warm and dry.  Neurological:  General: No focal deficit present.     Mental Status: She is alert and oriented to person, place, and time.  Psychiatric:        Mood and Affect: Mood normal.        Behavior: Behavior normal.      No results found for any visits on 01/11/23.  Recent Results (from the past 2160 hour(s))  Basic metabolic panel     Status: None   Collection Time: 10/26/22  2:21 PM  Result Value Ref Range   Glucose 96 70 - 99 mg/dL   BUN 20 8 - 27 mg/dL   Creatinine, Ser 8.41 0.57 - 1.00 mg/dL   eGFR 73 >66 AY/TKZ/6.01   BUN/Creatinine Ratio 23 12 - 28   Sodium 141 134 - 144 mmol/L   Potassium 4.5 3.5 - 5.2 mmol/L   Chloride 103 96 - 106 mmol/L   CO2 21 20 - 29 mmol/L   Calcium 9.5 8.7 - 10.3 mg/dL  [id number]     Status: None   Collection Time: 10/26/22  2:21 PM  Result Value Ref Range   TSH 3.460 0.450 - 4.500 uIU/mL   T3, Free 2.3 2.0 - 4.4 pg/mL   Free T4 1.29 0.82 - 1.77 ng/dL  Comprehensive metabolic panel     Status: Abnormal   Collection Time: 12/16/22  3:53 PM  Result Value Ref Range   Sodium 138 135 - 145 mmol/L   Potassium 3.5 3.5 - 5.1 mmol/L   Chloride 104 98 - 111 mmol/L   CO2 23 22 - 32 mmol/L   Glucose, Bld 111 (H) 70 - 99 mg/dL    Comment: Glucose reference range applies only to samples taken after fasting for at least 8 hours.   BUN 19 8 - 23 mg/dL   Creatinine, Ser 2.54 (H) 0.44 - 1.00 mg/dL   Calcium 9.2 8.9 - 27.0 mg/dL   Total Protein 8.4 (H) 6.5 - 8.1 g/dL   Albumin 4.8 3.5 - 5.0 g/dL   AST 33 15 - 41 U/L   ALT 32 0 - 44 U/L   Alkaline Phosphatase 79 38 - 126 U/L   Total Bilirubin 0.9 0.3 - 1.2 mg/dL   GFR, Estimated 53 (L) >60 mL/min    Comment: (NOTE) Calculated using the CKD-EPI  Creatinine Equation (2021)    Anion gap 11 5 - 15    Comment: Performed at [facility], [address] [place], [place] [address]  Lipase, blood     Status: None   Collection Time: 12/16/22  3:53 PM  Result Value Ref Range   Lipase 31 11 - 51 U/L    Comment: Performed at [facility], [address] [place], [place] [address]  CBC with Differential     Status: None   Collection Time: 12/16/22  3:53 PM  Result Value Ref Range   WBC 7.5 4.0 - 10.5 K/uL   RBC 4.54 3.87 - 5.11 MIL/uL   Hemoglobin 13.6 12.0 - 15.0 g/dL   HCT 17.6 16.0 - 73.7 %   MCV 90.7 80.0 - 100.0 fL   MCH 30.0 26.0 - 34.0 pg   MCHC 33.0 30.0 - 36.0 g/dL   RDW 10.6 26.9 - 48.5 %   Platelets 222 150 - 400 K/uL   nRBC 0.0 0.0 - 0.2 %   Neutrophils Relative % 78 %   Neutro Abs 5.9 1.7 - 7.7 K/uL   Lymphocytes Relative 15 %   Lymphs Abs 1.1 0.7 -  4.0 K/uL   Monocytes Relative 5 %   Monocytes Absolute 0.4 0.1 - 1.0 K/uL   Eosinophils Relative 1 %   Eosinophils Absolute 0.1 0.0 - 0.5 K/uL   Basophils Relative 1 %   Basophils Absolute 0.0 0.0 - 0.1 K/uL   Immature Granulocytes 0 %   Abs Immature Granulocytes 0.03 0.00 - 0.07 K/uL    Comment: Performed at [facility], [address] [place], [place] [address]  Urinalysis, Routine w reflex microscopic -Urine, Clean Catch     Status: Abnormal   Collection Time: 12/16/22  6:39 PM  Result Value Ref Range   Color, Urine YELLOW (A) YELLOW   APPearance HAZY (A) CLEAR   Specific Gravity, Urine 1.018 1.005 - 1.030   pH 6.0 5.0 - 8.0   Glucose, UA NEGATIVE NEGATIVE mg/dL   Hgb urine dipstick SMALL (A) NEGATIVE   Bilirubin Urine NEGATIVE NEGATIVE   Ketones, ur NEGATIVE NEGATIVE mg/dL   Protein, ur NEGATIVE NEGATIVE mg/dL   Nitrite NEGATIVE NEGATIVE   Leukocytes,Ua MODERATE (A) NEGATIVE   RBC / HPF 11-20 0 - 5 RBC/hpf   WBC, UA 21-50 0 - 5 WBC/hpf   Bacteria, UA NONE SEEN NONE SEEN   Squamous Epithelial / HPF 0-5 0 - 5 /HPF   Mucus  PRESENT     Comment: Performed at [facility], [address] [place], [place] [address]  Urinalysis, Complete     Status: Abnormal   Collection Time: 12/20/22  9:46 AM  Result Value Ref Range   Specific Gravity, UA 1.015 1.005 - 1.030   pH, UA 7.0 5.0 - 7.5   Color, UA Yellow Yellow   Appearance Ur Clear Clear   Leukocytes,UA Trace (A) Negative   Protein,UA 1+ (A) Negative/Trace   Glucose, UA Negative Negative   Ketones, UA Negative Negative   RBC, UA Trace (A) Negative   Bilirubin, UA Negative Negative   Urobilinogen, Ur 0.2 0.2 - 1.0 mg/dL   Nitrite, UA Negative Negative   Microscopic Examination See below:   Microscopic Examination     Status: Abnormal   Collection Time: 12/20/22  9:46 AM   Urine  Result Value Ref Range   WBC, UA 11-30 (A) 0 - 5 /hpf   RBC, Urine 11-30 (A) 0 - 2 /hpf   Epithelial Cells (non renal) 0-10 0 - 10 /hpf   Casts Present (A) None seen /lpf   Cast Type Granular casts (A) N/A   Bacteria, UA Few None seen/Few  CULTURE, URINE COMPREHENSIVE     Status: None   Collection Time: 12/20/22 11:30 AM   Specimen: Urine   UR  Result Value Ref Range   Urine Culture, Comprehensive Final report    Organism ID, Bacteria Comment     Comment: No growth in 36 - 48 hours.  CBC with Diff     Status: None   Collection Time: 01/04/23 11:48 AM  Result Value Ref Range   WBC 3.7 3.4 - 10.8 x10E3/uL   RBC 4.53 3.77 - 5.28 x10E6/uL   Hemoglobin 13.4 11.1 - 15.9 g/dL   Hematocrit 09.8 11.9 - 46.6 %   MCV 91 79 - 97 fL   MCH 29.6 26.6 - 33.0 pg   MCHC 32.6 31.5 - 35.7 g/dL   RDW 14.7 82.9 - 56.2 %   Platelets 251 150 - 450 x10E3/uL   Neutrophils 51 Not Estab. %   Lymphs 35 Not Estab. %   Monocytes 9 Not Estab. %  Eos 4 Not Estab. %   Basos 1 Not Estab. %   Neutrophils Absolute 1.9 1.4 - 7.0 x10E3/uL   Lymphocytes Absolute 1.3 0.7 - 3.1 x10E3/uL   Monocytes Absolute 0.3 0.1 - 0.9 x10E3/uL   EOS (ABSOLUTE) 0.1 0.0 - 0.4 x10E3/uL   Basophils Absolute  0.0 0.0 - 0.2 x10E3/uL   Immature Granulocytes 0 Not Estab. %   Immature Grans (Abs) 0.0 0.0 - 0.1 x10E3/uL  CMP14+EGFR     Status: Abnormal   Collection Time: 01/04/23 11:48 AM  Result Value Ref Range   Glucose 101 (H) 70 - 99 mg/dL   BUN 20 8 - 27 mg/dL   Creatinine, Ser 0.98 (H) 0.57 - 1.00 mg/dL   eGFR 58 (L) >11 BJ/YNW/2.95   BUN/Creatinine Ratio 19 12 - 28   Sodium 143 134 - 144 mmol/L   Potassium 4.3 3.5 - 5.2 mmol/L   Chloride 104 96 - 106 mmol/L   CO2 23 20 - 29 mmol/L   Calcium 10.0 8.7 - 10.3 mg/dL   Total Protein 7.4 6.0 - 8.5 g/dL   Albumin 4.8 3.9 - 4.9 g/dL   Globulin, Total 2.6 1.5 - 4.5 g/dL   Bilirubin Total 0.3 0.0 - 1.2 mg/dL   Alkaline Phosphatase 85 44 - 121 IU/L   AST 29 0 - 40 IU/L   ALT 33 (H) 0 - 32 IU/L  Hemoglobin A1c     Status: Abnormal   Collection Time: 01/04/23 11:48 AM  Result Value Ref Range   Hgb A1c MFr Bld 6.3 (H) 4.8 - 5.6 %    Comment:          Prediabetes: 5.7 - 6.4          Diabetes: >6.4          Glycemic control for adults with diabetes: <7.0    Est. average glucose Bld gHb Est-mCnc 134 mg/dL  Lipid Panel w/o Chol/HDL Ratio     Status: None   Collection Time: 01/04/23 11:48 AM  Result Value Ref Range   Cholesterol, Total 180 100 - 199 mg/dL   Triglycerides 621 0 - 149 mg/dL   HDL 62 >30 mg/dL   VLDL Cholesterol Cal 20 5 - 40 mg/dL   LDL Chol Calc (NIH) 98 0 - 99 mg/dL  [id number]     Status: None   Collection Time: 01/04/23 11:48 AM  Result Value Ref Range   TSH 0.929 0.450 - 4.500 uIU/mL   T3, Free 3.2 2.0 - 4.4 pg/mL   Free T4 1.51 0.82 - 1.77 ng/dL      Assessment & Plan:  Patient advised to continue her all her medications. Continue Mounjaro along with strict diet and physical exercise. Problem List Items Addressed This Visit     Hypothyroidism   Essential hypertension, benign   Mixed hyperlipidemia   Prediabetes   Relevant Medications   tirzepatide [facility]) 5 MG/0.5ML Pen   Other Visit Diagnoses      Annual wellness visit    -  Primary       Return in about 6 weeks (around 02/22/2023).   Total time spent: 30 minutes  Margaretann Loveless, MD  01/11/2023   This document may have been prepared by [facility] Voice Recognition software and as such may include unintentional dictation errors.

## 2023-01-22 DIAGNOSIS — R69 Illness, unspecified: Secondary | ICD-10-CM | POA: Diagnosis not present

## 2023-01-23 ENCOUNTER — Encounter: Payer: Self-pay | Admitting: Psychiatry

## 2023-01-23 ENCOUNTER — Other Ambulatory Visit: Payer: Self-pay | Admitting: Internal Medicine

## 2023-01-23 ENCOUNTER — Ambulatory Visit: Payer: Medicare HMO | Admitting: Psychiatry

## 2023-01-23 VITALS — BP 126/86 | HR 97 | Temp 98.3°F | Ht 63.0 in | Wt 192.6 lb

## 2023-01-23 DIAGNOSIS — F3342 Major depressive disorder, recurrent, in full remission: Secondary | ICD-10-CM

## 2023-01-23 DIAGNOSIS — F411 Generalized anxiety disorder: Secondary | ICD-10-CM | POA: Diagnosis not present

## 2023-01-23 MED ORDER — BUPROPION HCL 75 MG PO TABS: 75.0000 mg | ORAL_TABLET | Freq: Every day | ORAL | 1 refills | Status: DC

## 2023-01-23 NOTE — Progress Notes (Unsigned)
BH MD OP Progress Note  01/23/2023 3:07 PM PABLITA CANIPE  MRN:  [id number]  Chief Complaint:  Chief Complaint  Patient presents with   Follow-up   Anxiety   Depression   Medication Refill   HPI: Shannon Crawford is a 67 year old biracial female, who has a history of GAD, MDD, chronic pain syndrome, vitiligo, hypertension, hypothyroidism, hyperlipidemia was evaluated in office today.  The patient, with a history of generalized anxiety and major depression, reports no changes in her condition since the last visit in August. She continues to take Wellbutrin and Cymbalta as prescribed.  The patient attempted to reduce the Cymbalta dosage but experienced setbacks, leading her to maintain the current dosage. She also takes Ambien as needed.  Patient denies side effects to medications.  The patient reports feeling generally well, but experiences heightened anxiety around the holiday season due to a desire for perfection in her holiday preparations. She expresses a tendency to go 'over the top' in her efforts to create memorable celebrations for her family, which can lead to feelings of stress and nervousness.  In addition to holiday preparations, the patient is involved in a quilting guild and is currently working on a project for a show in [place], [place]. This project also contributes to her anxiety as she strives for perfection in her work and feels a sense of competition with other guild members.  The patient also recounts a recent experience with a kidney stone in late October. She experienced significant discomfort and sought emergency medical attention. The stone was identified via imaging and the patient was prescribed pain medication. The patient is unsure if the stone has passed and has an upcoming appointment for a follow-up scan.  The patient also mentions a recent change in her blood pressure medication, with the addition of a second medication, amlodipine. She expresses hope that weight  loss might improve her blood pressure.  She is currently on Mounjaro for weight loss.  The patient also reports a recent withdrawal from a vitiligo study due to perceived adverse side effects, which coincided with a period of feeling unwell due to a low thyroid level. The patient's thyroid levels have since been adjusted and she reports feeling much better.  Patient denies any other concerns today.  Visit Diagnosis:    ICD-10-CM   1. GAD (generalized anxiety disorder)  F41.1 buPROPion (WELLBUTRIN) 75 MG tablet    2. MDD (major depressive disorder), recurrent, in full remission (HCC)  F33.42       Past Psychiatric History: I have reviewed past psychiatric history from progress note on 03/29/2021.  Past trials of medications like Lexapro, Zoloft, duloxetine, zolpidem, Rexulti, Xanax, Topamax.  Past Medical History:  Past Medical History:  Diagnosis Date   Anxiety    Fracture    ankle   Heart murmur    Hypothyroid    Impaired glucose tolerance    Mixed hyperlipidemia    OSA on CPAP    Primary vitiligo    Thyroid disease     Past Surgical History:  Procedure Laterality Date   ABDOMINAL SURGERY     tummy tuck   BREAST BIOPSY Left 10+ years   Negative- core bx    Family Psychiatric History: I have reviewed family psychiatric history from progress note on 03/29/2021.  Family History:  Family History  Problem Relation Age of Onset   Hypertension Mother    Alcohol abuse Father    Anxiety disorder Sister    Prostate cancer Brother  Hypertension Sister    Breast cancer Sister 34   Breast cancer Cousin     Social History: I have reviewed social history from progress note on 03/29/2021. Social History   Socioeconomic History   Marital status: Married    Spouse name: John   Number of children: 2   Years of education: Not on file   Highest education level: Some college, no degree  Occupational History   Not on file  Tobacco Use   Smoking status: Never   Smokeless tobacco:  Never  Vaping Use   Vaping status: Never Used  Substance and Sexual Activity   Alcohol use: Yes    Alcohol/week: 12.0 standard drinks of alcohol    Types: 12 Cans of beer per week   Drug use: No   Sexual activity: Yes  Other Topics Concern   Not on file  Social History Narrative   Not on file   Social Determinants of Health   Financial Resource Strain: Not on file  Food Insecurity: No Food Insecurity (01/11/2023)   Hunger Vital Sign    Worried About Running Out of Food in the Last Year: Never true    Ran Out of Food in the Last Year: Never true  Transportation Needs: No Transportation Needs (01/11/2023)   PRAPARE - Administrator, Civil Service (Medical): No    Lack of Transportation (Non-Medical): No  Physical Activity: Not on file  Stress: Not on file  Social Connections: Not on file    Allergies:  Allergies  Allergen Reactions   Catfish [Fish Allergy] Nausea And Vomiting    Metabolic Disorder Labs: Lab Results  Component Value Date   HGBA1C 6.3 (H) 01/04/2023   No results found for: "PROLACTIN" Lab Results  Component Value Date   CHOL 180 01/04/2023   TRIG 111 01/04/2023   HDL 62 01/04/2023   LDLCALC 98 01/04/2023   LDLCALC 112 (H) 09/25/2022   Lab Results  Component Value Date   TSH 0.929 01/04/2023   TSH 3.460 10/26/2022    Therapeutic Level Labs: No results found for: "LITHIUM" No results found for: "VALPROATE" No results found for: "CBMZ"  Current Medications: Current Outpatient Medications  Medication Sig Dispense Refill   amLODipine (NORVASC) 5 MG tablet Take 1 tablet (5 mg total) by mouth daily. 30 tablet 11   B Complex-C (SUPER B COMPLEX PO) Take by mouth.     Cholecalciferol (D-3-5) 125 MCG (5000 UT) capsule Take 5,000 Units by mouth daily.     DULoxetine (CYMBALTA) 20 MG capsule TAKE 2 CAPSULES BY MOUTH DAILY 180 capsule 1   levothyroxine (SYNTHROID) 112 MCG tablet Take 1 tablet (112 mcg total) by mouth daily. 30 tablet 11    losartan-hydrochlorothiazide (HYZAAR) 100-25 MG tablet TAKE 1 TABLET BY MOUTH DAILY 90 tablet 3   rosuvastatin (CRESTOR) 20 MG tablet TAKE 1 TABLET BY MOUTH DAILY 90 tablet 3   tirzepatide (MOUNJARO) 5 MG/0.5ML Pen Inject 5 mg into the skin once a week. 2 mL 0   zolpidem (AMBIEN) 5 MG tablet Take 1 tablet (5 mg total) by mouth at bedtime as needed for sleep. 30 tablet 1   buPROPion (WELLBUTRIN) 75 MG tablet Take 1 tablet (75 mg total) by mouth daily with breakfast. 90 tablet 1   No current facility-administered medications for this visit.     Musculoskeletal: Strength & Muscle Tone: within normal limits Gait & Station: normal Patient leans: N/A  Psychiatric Specialty Exam: Review of Systems  Psychiatric/Behavioral: Negative.  Blood pressure 126/86, pulse 97, temperature 98.3 F (36.8 C), temperature source Temporal, height 5\' 3"  (1.6 m), weight 192 lb 9.6 oz (87.4 kg), SpO2 99%.Body mass index is 34.12 kg/m.  General Appearance: Casual  Eye Contact:  Fair  Speech:  Clear and Coherent  Volume:  Normal  Mood:  Euthymic  Affect:  Congruent  Thought Process:  Goal Directed and Descriptions of Associations: Intact  Orientation:  Full (Time, Place, and Person)  Thought Content: Logical   Suicidal Thoughts:  No  Homicidal Thoughts:  No  Memory:  Immediate;   Fair Recent;   Fair Remote;   Fair  Judgement:  Fair  Insight:  Fair  Psychomotor Activity:  Normal  Concentration:  Concentration: Fair and Attention Span: Fair  Recall:  [employer] of Knowledge: Fair  Language: Fair  Akathisia:  No  Handed:  Right  AIMS (if indicated): not done  Assets:  Communication Skills Desire for Improvement Housing Social Support  ADL's:  Intact  Cognition: WNL  Sleep:  Fair   Screenings: Midwife Visit from 09/29/2021 in [facility] Office Visit from 07/14/2021 in [facility]  AIMS  Total Score 0 0      AUDIT    Flowsheet Row Counselor from 04/06/2022 in [facility] Visit from 03/29/2021 in [facility]  Alcohol Use Disorder Identification Test Final Score (AUDIT) 15 13      GAD-7    Flowsheet Row Office Visit from 01/23/2023 in [facility] Office Visit from 01/11/2023 in [facility] Office Visit from 10/19/2022 in [facility] Office Visit from 08/31/2022 in [facility] Office Visit from 03/30/2022 in [facility]  Total GAD-7 Score 1 2 0 14 3      PHQ2-9    Flowsheet Row Office Visit from 01/23/2023 in [facility] Office Visit from 01/11/2023 in [facility] Office Visit from 10/19/2022 in [facility] Office Visit from 08/31/2022 in [facility] Office Visit from 03/30/2022 in [facility]  PHQ-2 Total Score 0 1 0 5 1  PHQ-9 Total Score -- 7 1 17 4       Flowsheet Row Office Visit from 01/23/2023 in [facility] ED from 12/16/2022 in [facility] Department at [facility] Visit from 10/19/2022 in [facility]  C-SSRS RISK CATEGORY No Risk No Risk No Risk        Assessment and Plan: HAZEL SAFFEL is a 67 year old female, married, lives in [place], has a history of depression, anxiety, vitiligo, hypothyroidism, chronic pain was evaluated in office today.  Patient is currently stable.  Plan  Generalized Anxiety Disorder-stable Increased anxiety related to holiday preparations and personal projects. Anxiety triggered by perfectionism  in family and quilting activities. Current medications manage baseline anxiety but situational anxiety persists. Discussed task delegation and reducing self-imposed pressure during holidays. - Continue Cymbalta 40 mg daily - Continue Wellbutrin 75 mg daily - Continue Ambien 5 mg as needed for sleep - Encourage task delegation and reducing self-imposed pressure during holidays  Major Depressive Disorder-in remission Stable mood and no recent depressive symptoms with current medication regimen. Emphasized the importance of maintaining the  current regimen for stability. - Continue Cymbalta 40 mg daily and Wellbutrin 75 mg daily  On losartan, amlodipine, and recently started [facility] for weight loss. Recent weight loss noted, potentially beneficial for blood pressure control. Discussed continued weight loss benefits. - Continue losartan, amlodipine, and rosuvastatin - Monitor blood pressure regularly - Encourage continued weight loss efforts with Mounjaro  Thyroid levels recently checked and adjusted.  Reviewed and discussed TSH level-dated 01/04/2023-within normal limits.  Discussed regular monitoring for optimal management. - Continue current thyroid medication regimen  Follow-up in 5 to 6 months or sooner if needed.  Collaboration of Care: Collaboration of Care: Primary Care Provider AEB patient encouraged to continue follow-up with primary care provider.  Patient/Guardian was advised Release of Information must be obtained prior to any record release in order to collaborate their care with an outside provider. Patient/Guardian was advised if they have not already done so to contact the registration department to sign all necessary forms in order for [place] to release information regarding their care.   Consent: Patient/Guardian gives verbal consent for treatment and assignment of benefits for services provided during this visit. Patient/Guardian expressed understanding and agreed to proceed.    This note was generated in part or whole with voice recognition software. Voice recognition is usually quite accurate but there are transcription errors that can and very often do occur. I apologize for any typographical errors that were not detected and corrected.    Jomarie Longs, MD 01/24/2023, 8:56 AM

## 2023-01-25 ENCOUNTER — Ambulatory Visit
Admission: RE | Admit: 2023-01-25 | Discharge: 2023-01-25 | Disposition: A | Discharge status: Home / Self Care | Payer: Medicare HMO | Source: Ambulatory Visit | Attending: Urology | Admitting: Urology

## 2023-01-25 DIAGNOSIS — R1084 Generalized abdominal pain: Secondary | ICD-10-CM | POA: Diagnosis not present

## 2023-01-25 DIAGNOSIS — N201 Calculus of ureter: Secondary | ICD-10-CM | POA: Diagnosis not present

## 2023-01-25 DIAGNOSIS — N132 Hydronephrosis with renal and ureteral calculous obstruction: Secondary | ICD-10-CM | POA: Diagnosis not present

## 2023-02-08 DIAGNOSIS — R69 Illness, unspecified: Secondary | ICD-10-CM | POA: Diagnosis not present

## 2023-02-13 ENCOUNTER — Other Ambulatory Visit: Payer: Self-pay | Admitting: Internal Medicine

## 2023-02-13 DIAGNOSIS — R7303 Prediabetes: Secondary | ICD-10-CM

## 2023-02-22 ENCOUNTER — Encounter: Payer: Self-pay | Admitting: Internal Medicine

## 2023-02-22 ENCOUNTER — Ambulatory Visit (INDEPENDENT_AMBULATORY_CARE_PROVIDER_SITE_OTHER): Payer: Medicare HMO | Admitting: Internal Medicine

## 2023-02-22 VITALS — BP 130/80 | HR 85 | Ht 63.0 in | Wt 196.0 lb

## 2023-02-22 DIAGNOSIS — G4733 Obstructive sleep apnea (adult) (pediatric): Secondary | ICD-10-CM | POA: Diagnosis not present

## 2023-02-22 DIAGNOSIS — I10 Essential (primary) hypertension: Secondary | ICD-10-CM | POA: Diagnosis not present

## 2023-02-22 DIAGNOSIS — R7303 Prediabetes: Secondary | ICD-10-CM | POA: Diagnosis not present

## 2023-02-22 DIAGNOSIS — E782 Mixed hyperlipidemia: Secondary | ICD-10-CM | POA: Diagnosis not present

## 2023-02-22 DIAGNOSIS — Z1231 Encounter for screening mammogram for malignant neoplasm of breast: Secondary | ICD-10-CM

## 2023-02-22 NOTE — Progress Notes (Signed)
 Established Patient Office Visit  Subjective:  Patient ID: Shannon Crawford, female    DOB: 1955/07/21  Age: 68 y.o. MRN: [id number]  Chief Complaint  Patient presents with   Follow-up    6 week    Patient comes in for her follow-up today.  She is currently on Mounjaro  injections 2.5 mg/week and has lost a few pounds.  She is tolerating it well and denies nausea or vomiting.  There is no abdominal pain but did feel some constipation earlier on, but now resolved.  She will be starting the 5 mg dose from next week.  Patient advised to drink more water.    No other concerns at this time.   Past Medical History:  Diagnosis Date   Anxiety    Fracture    ankle   Heart murmur    Hypothyroid    Impaired glucose tolerance    Mixed hyperlipidemia    OSA on CPAP    Primary vitiligo    Thyroid  disease     Past Surgical History:  Procedure Laterality Date   ABDOMINAL SURGERY     tummy tuck   BREAST BIOPSY Left 10+ years   Negative- core bx    Social History   Socioeconomic History   Marital status: Married    Spouse name: John   Number of children: 2   Years of education: Not on file   Highest education level: Some college, no degree  Occupational History   Not on file  Tobacco Use   Smoking status: Never   Smokeless tobacco: Never  Vaping Use   Vaping status: Never Used  Substance and Sexual Activity   Alcohol use: Yes    Alcohol/week: 12.0 standard drinks of alcohol    Types: 12 Cans of beer per week   Drug use: No   Sexual activity: Yes  Other Topics Concern   Not on file  Social History Narrative   Not on file   Social Drivers of Health   Financial Resource Strain: Not on file  Food Insecurity: No Food Insecurity (01/11/2023)   Hunger Vital Sign    Worried About Running Out of Food in the Last Year: Never true    Ran Out of Food in the Last Year: Never true  Transportation Needs: No Transportation Needs (01/11/2023)   PRAPARE - Scientist, Research (physical Sciences) (Medical): No    Lack of Transportation (Non-Medical): No  Physical Activity: Not on file  Stress: Not on file  Social Connections: Not on file  Intimate Partner Violence: Not At Risk (01/11/2023)   Humiliation, Afraid, Rape, and Kick questionnaire    Fear of Current or Ex-Partner: No    Emotionally Abused: No    Physically Abused: No    Sexually Abused: No    Family History  Problem Relation Age of Onset   Hypertension Mother    Alcohol abuse Father    Anxiety disorder Sister    Prostate cancer Brother    Hypertension Sister    Breast cancer Sister 20   Breast cancer Cousin     Allergies  Allergen Reactions   Catfish [Fish Allergy] Nausea And Vomiting    Outpatient Medications Prior to Visit  Medication Sig   amLODipine  (NORVASC ) 5 MG tablet Take 1 tablet (5 mg total) by mouth daily.   B Complex-C (SUPER B COMPLEX PO) Take by mouth.   buPROPion  (WELLBUTRIN ) 75 MG tablet Take 1 tablet (75 mg total) by mouth daily  with breakfast.   Cholecalciferol (D-3-5) 125 MCG (5000 UT) capsule Take 5,000 Units by mouth daily.   DULoxetine  (CYMBALTA ) 20 MG capsule TAKE 2 CAPSULES BY MOUTH DAILY   levothyroxine  (SYNTHROID ) 112 MCG tablet Take 1 tablet (112 mcg total) by mouth daily.   losartan-hydrochlorothiazide (HYZAAR) 100-25 MG tablet TAKE 1 TABLET BY MOUTH DAILY   MOUNJARO  5 MG/0.5ML Pen INJECT 5 MG UNDER THE SKIN ONCE WEEKLY   rosuvastatin (CRESTOR) 20 MG tablet TAKE 1 TABLET BY MOUTH DAILY   zolpidem  (AMBIEN ) 5 MG tablet Take 1 tablet (5 mg total) by mouth at bedtime as needed for sleep.   No facility-administered medications prior to visit.    Review of Systems  Constitutional: Negative.  Negative for chills, diaphoresis, fever and malaise/fatigue.  HENT: Negative.  Negative for ear discharge.   Eyes: Negative.   Respiratory: Negative.  Negative for cough and shortness of breath.   Cardiovascular: Negative.  Negative for chest pain, palpitations and leg  swelling.  Gastrointestinal: Negative.  Negative for abdominal pain, blood in stool, constipation, diarrhea, heartburn, melena, nausea and vomiting.  Genitourinary: Negative.  Negative for dysuria and flank pain.  Musculoskeletal: Negative.  Negative for joint pain and myalgias.  Skin: Negative.   Neurological: Negative.  Negative for dizziness and headaches.  Endo/Heme/Allergies: Negative.   Psychiatric/Behavioral: Negative.  Negative for depression and suicidal ideas. The patient is not nervous/anxious.        Objective:   BP 130/80   Pulse 85   Ht 5' 3 (1.6 m)   Wt 196 lb (88.9 kg)   SpO2 98%   BMI 34.72 kg/m   Vitals:   02/22/23 1259  BP: 130/80  Pulse: 85  Height: 5' 3 (1.6 m)  Weight: 196 lb (88.9 kg)  SpO2: 98%  BMI (Calculated): 34.73    Physical Exam Vitals and nursing note reviewed.  Constitutional:      Appearance: Normal appearance.  HENT:     Head: Normocephalic and atraumatic.     Nose: Nose normal.     Mouth/Throat:     Mouth: Mucous membranes are moist.     Pharynx: Oropharynx is clear.  Eyes:     Conjunctiva/sclera: Conjunctivae normal.     Pupils: Pupils are equal, round, and reactive to light.  Cardiovascular:     Rate and Rhythm: Normal rate and regular rhythm.     Pulses: Normal pulses.     Heart sounds: Normal heart sounds. No murmur heard. Pulmonary:     Effort: Pulmonary effort is normal.     Breath sounds: Normal breath sounds. No wheezing.  Abdominal:     General: Bowel sounds are normal.     Palpations: Abdomen is soft.     Tenderness: There is no abdominal tenderness. There is no right CVA tenderness or left CVA tenderness.  Musculoskeletal:        General: Normal range of motion.     Cervical back: Normal range of motion.     Right lower leg: No edema.     Left lower leg: No edema.  Skin:    General: Skin is warm and dry.  Neurological:     General: No focal deficit present.     Mental Status: She is alert and oriented to  person, place, and time.  Psychiatric:        Mood and Affect: Mood normal.        Behavior: Behavior normal.      No results found for any  visits on 02/22/23.  Recent Results (from the past 2160 hours)  Comprehensive metabolic panel     Status: Abnormal   Collection Time: 12/16/22  3:53 PM  Result Value Ref Range   Sodium 138 135 - 145 mmol/L   Potassium 3.5 3.5 - 5.1 mmol/L   Chloride 104 98 - 111 mmol/L   CO2 23 22 - 32 mmol/L   Glucose, Bld 111 (H) 70 - 99 mg/dL    Comment: Glucose reference range applies only to samples taken after fasting for at least 8 hours.   BUN 19 8 - 23 mg/dL   Creatinine, Ser 8.86 (H) 0.44 - 1.00 mg/dL   Calcium 9.2 8.9 - 89.6 mg/dL   Total Protein 8.4 (H) 6.5 - 8.1 g/dL   Albumin 4.8 3.5 - 5.0 g/dL   AST 33 15 - 41 U/L   ALT 32 0 - 44 U/L   Alkaline Phosphatase 79 38 - 126 U/L   Total Bilirubin 0.9 0.3 - 1.2 mg/dL   GFR, Estimated 53 (L) >60 mL/min    Comment: (NOTE) Calculated using the CKD-EPI Creatinine Equation (2021)    Anion gap 11 5 - 15    Comment: Performed at [facility], [address] [place], [place] [address]  Lipase, blood     Status: None   Collection Time: 12/16/22  3:53 PM  Result Value Ref Range   Lipase 31 11 - 51 U/L    Comment: Performed at [facility], [address] [place], [place] [address]  CBC with Differential     Status: None   Collection Time: 12/16/22  3:53 PM  Result Value Ref Range   WBC 7.5 4.0 - 10.5 K/uL   RBC 4.54 3.87 - 5.11 MIL/uL   Hemoglobin 13.6 12.0 - 15.0 g/dL   HCT 58.7 63.9 - 53.9 %   MCV 90.7 80.0 - 100.0 fL   MCH 30.0 26.0 - 34.0 pg   MCHC 33.0 30.0 - 36.0 g/dL   RDW 85.9 88.4 - 84.4 %   Platelets 222 150 - 400 K/uL   nRBC 0.0 0.0 - 0.2 %   Neutrophils Relative % 78 %   Neutro Abs 5.9 1.7 - 7.7 K/uL   Lymphocytes Relative 15 %   Lymphs Abs 1.1 0.7 - 4.0 K/uL   Monocytes Relative 5 %   Monocytes Absolute 0.4 0.1 - 1.0 K/uL   Eosinophils Relative 1 %    Eosinophils Absolute 0.1 0.0 - 0.5 K/uL   Basophils Relative 1 %   Basophils Absolute 0.0 0.0 - 0.1 K/uL   Immature Granulocytes 0 %   Abs Immature Granulocytes 0.03 0.00 - 0.07 K/uL    Comment: Performed at [facility], [address] [place], [place] [address]  Urinalysis, Routine w reflex microscopic -Urine, Clean Catch     Status: Abnormal   Collection Time: 12/16/22  6:39 PM  Result Value Ref Range   Color, Urine YELLOW (A) YELLOW   APPearance HAZY (A) CLEAR   Specific Gravity, Urine 1.018 1.005 - 1.030   pH 6.0 5.0 - 8.0   Glucose, UA NEGATIVE NEGATIVE mg/dL   Hgb urine dipstick SMALL (A) NEGATIVE   Bilirubin Urine NEGATIVE NEGATIVE   Ketones, ur NEGATIVE NEGATIVE mg/dL   Protein, ur NEGATIVE NEGATIVE mg/dL   Nitrite NEGATIVE NEGATIVE   Leukocytes,Ua MODERATE (A) NEGATIVE   RBC / HPF 11-20 0 - 5 RBC/hpf   WBC, UA 21-50 0 - 5 WBC/hpf  Bacteria, UA NONE SEEN NONE SEEN   Squamous Epithelial / HPF 0-5 0 - 5 /HPF   Mucus PRESENT     Comment: Performed at [facility], [address] [place], [place] [address]  Urinalysis, Complete     Status: Abnormal   Collection Time: 12/20/22  9:46 AM  Result Value Ref Range   Specific Gravity, UA 1.015 1.005 - 1.030   pH, UA 7.0 5.0 - 7.5   Color, UA Yellow Yellow   Appearance Ur Clear Clear   Leukocytes,UA Trace (A) Negative   Protein,UA 1+ (A) Negative/Trace   Glucose, UA Negative Negative   Ketones, UA Negative Negative   RBC, UA Trace (A) Negative   Bilirubin, UA Negative Negative   Urobilinogen, Ur 0.2 0.2 - 1.0 mg/dL   Nitrite, UA Negative Negative   Microscopic Examination See below:   Microscopic Examination     Status: Abnormal   Collection Time: 12/20/22  9:46 AM   Urine  Result Value Ref Range   WBC, UA 11-30 (A) 0 - 5 /hpf   RBC, Urine 11-30 (A) 0 - 2 /hpf   Epithelial Cells (non renal) 0-10 0 - 10 /hpf   Casts Present (A) None seen /lpf   Cast Type Granular casts (A) N/A   Bacteria, UA Few  None seen/Few  CULTURE, URINE COMPREHENSIVE     Status: None   Collection Time: 12/20/22 11:30 AM   Specimen: Urine   UR  Result Value Ref Range   Urine Culture, Comprehensive Final report    Organism ID, Bacteria Comment     Comment: No growth in 36 - 48 hours.  CBC with Diff     Status: None   Collection Time: 01/04/23 11:48 AM  Result Value Ref Range   WBC 3.7 3.4 - 10.8 x10E3/uL   RBC 4.53 3.77 - 5.28 x10E6/uL   Hemoglobin 13.4 11.1 - 15.9 g/dL   Hematocrit 58.8 65.9 - 46.6 %   MCV 91 79 - 97 fL   MCH 29.6 26.6 - 33.0 pg   MCHC 32.6 31.5 - 35.7 g/dL   RDW 87.4 88.2 - 84.5 %   Platelets 251 150 - 450 x10E3/uL   Neutrophils 51 Not Estab. %   Lymphs 35 Not Estab. %   Monocytes 9 Not Estab. %   Eos 4 Not Estab. %   Basos 1 Not Estab. %   Neutrophils Absolute 1.9 1.4 - 7.0 x10E3/uL   Lymphocytes Absolute 1.3 0.7 - 3.1 x10E3/uL   Monocytes Absolute 0.3 0.1 - 0.9 x10E3/uL   EOS (ABSOLUTE) 0.1 0.0 - 0.4 x10E3/uL   Basophils Absolute 0.0 0.0 - 0.2 x10E3/uL   Immature Granulocytes 0 Not Estab. %   Immature Grans (Abs) 0.0 0.0 - 0.1 x10E3/uL  CMP14+EGFR     Status: Abnormal   Collection Time: 01/04/23 11:48 AM  Result Value Ref Range   Glucose 101 (H) 70 - 99 mg/dL   BUN 20 8 - 27 mg/dL   Creatinine, Ser 8.95 (H) 0.57 - 1.00 mg/dL   eGFR 58 (L) >40 fO/fpw/8.26   BUN/Creatinine Ratio 19 12 - 28   Sodium 143 134 - 144 mmol/L   Potassium 4.3 3.5 - 5.2 mmol/L   Chloride 104 96 - 106 mmol/L   CO2 23 20 - 29 mmol/L   Calcium 10.0 8.7 - 10.3 mg/dL   Total Protein 7.4 6.0 - 8.5 g/dL   Albumin 4.8 3.9 - 4.9 g/dL   Globulin, Total 2.6 1.5 -  4.5 g/dL   Bilirubin Total 0.3 0.0 - 1.2 mg/dL   Alkaline Phosphatase 85 44 - 121 IU/L   AST 29 0 - 40 IU/L   ALT 33 (H) 0 - 32 IU/L  Hemoglobin A1c     Status: Abnormal   Collection Time: 01/04/23 11:48 AM  Result Value Ref Range   Hgb A1c MFr Bld 6.3 (H) 4.8 - 5.6 %    Comment:          Prediabetes: 5.7 - 6.4          Diabetes: >6.4           Glycemic control for adults with diabetes: <7.0    Est. average glucose Bld gHb Est-mCnc 134 mg/dL  Lipid Panel w/o Chol/HDL Ratio     Status: None   Collection Time: 01/04/23 11:48 AM  Result Value Ref Range   Cholesterol, Total 180 100 - 199 mg/dL   Triglycerides 888 0 - 149 mg/dL   HDL 62 >60 mg/dL   VLDL Cholesterol Cal 20 5 - 40 mg/dL   LDL Chol Calc (NIH) 98 0 - 99 mg/dL  [id number]     Status: None   Collection Time: 01/04/23 11:48 AM  Result Value Ref Range   TSH 0.929 0.450 - 4.500 uIU/mL   T3, Free 3.2 2.0 - 4.4 pg/mL   Free T4 1.51 0.82 - 1.77 ng/dL      Assessment & Plan:  Patient still needs to schedule her mammogram.  Order has been sent. Increase dose of Mounjaro  to 5 mg/week.  Increase fluid intake. Problem List Items Addressed This Visit     Essential hypertension, benign - Primary   Mixed hyperlipidemia   OSA on CPAP   Prediabetes   Other Visit Diagnoses       Breast cancer screening by mammogram       Relevant Orders   MM 3D SCREENING MAMMOGRAM BILATERAL BREAST       Return in about 5 weeks (around 03/29/2023).   Total time spent: 25 minutes  FERNAND FREDY RAMAN, MD  02/22/2023   This document may have been prepared by [facility] Voice Recognition software and as such may include unintentional dictation errors.

## 2023-03-12 ENCOUNTER — Other Ambulatory Visit: Payer: Self-pay | Admitting: Internal Medicine

## 2023-03-12 DIAGNOSIS — R7303 Prediabetes: Secondary | ICD-10-CM

## 2023-03-12 DIAGNOSIS — E6609 Other obesity due to excess calories: Secondary | ICD-10-CM

## 2023-03-12 MED ORDER — MOUNJARO 7.5 MG/0.5ML ~~LOC~~ SOAJ: 7.5000 mg | SUBCUTANEOUS | 1 refills | Status: DC

## 2023-03-29 ENCOUNTER — Ambulatory Visit (INDEPENDENT_AMBULATORY_CARE_PROVIDER_SITE_OTHER): Payer: Medicare HMO | Admitting: Internal Medicine

## 2023-03-29 ENCOUNTER — Encounter: Payer: Self-pay | Admitting: Internal Medicine

## 2023-03-29 VITALS — BP 144/86 | HR 86 | Ht 63.0 in | Wt 192.0 lb

## 2023-03-29 DIAGNOSIS — E6609 Other obesity due to excess calories: Secondary | ICD-10-CM | POA: Diagnosis not present

## 2023-03-29 DIAGNOSIS — F411 Generalized anxiety disorder: Secondary | ICD-10-CM | POA: Diagnosis not present

## 2023-03-29 DIAGNOSIS — E66811 Obesity, class 1: Secondary | ICD-10-CM | POA: Diagnosis not present

## 2023-03-29 DIAGNOSIS — E782 Mixed hyperlipidemia: Secondary | ICD-10-CM

## 2023-03-29 DIAGNOSIS — E039 Hypothyroidism, unspecified: Secondary | ICD-10-CM | POA: Diagnosis not present

## 2023-03-29 DIAGNOSIS — I10 Essential (primary) hypertension: Secondary | ICD-10-CM

## 2023-03-29 DIAGNOSIS — R7303 Prediabetes: Secondary | ICD-10-CM

## 2023-03-29 NOTE — Progress Notes (Signed)
 Established Patient Office Visit  Subjective:  Patient ID: Shannon Crawford, female    DOB: 08-03-1955  Age: 68 y.o. MRN: [id number]  Chief Complaint  Patient presents with   Follow-up    5 week follow up    Patient comes in for her follow-up today.  She generally feels well but her blood pressure is slightly elevated today.  Currently she is on Mounjaro  5 mg/week.  She is tolerating it well and noted mild constipation but now is drinking more water.  Will be going up to the next dose of 7.5 mg/week.  She has been losing weight successfully on it. Her labs are not due until another 2 weeks.  Mammogram is already scheduled.    No other concerns at this time.   Past Medical History:  Diagnosis Date   Anxiety    Fracture    ankle   Heart murmur    Hypothyroid    Impaired glucose tolerance    Mixed hyperlipidemia    OSA on CPAP    Primary vitiligo    Thyroid  disease     Past Surgical History:  Procedure Laterality Date   ABDOMINAL SURGERY     tummy tuck   BREAST BIOPSY Left 10+ years   Negative- core bx    Social History   Socioeconomic History   Marital status: Married    Spouse name: John   Number of children: 2   Years of education: Not on file   Highest education level: Some college, no degree  Occupational History   Not on file  Tobacco Use   Smoking status: Never   Smokeless tobacco: Never  Vaping Use   Vaping status: Never Used  Substance and Sexual Activity   Alcohol use: Yes    Alcohol/week: 12.0 standard drinks of alcohol    Types: 12 Cans of beer per week   Drug use: No   Sexual activity: Yes  Other Topics Concern   Not on file  Social History Narrative   Not on file   Social Drivers of Health   Financial Resource Strain: Not on file  Food Insecurity: No Food Insecurity (01/11/2023)   Hunger Vital Sign    Worried About Running Out of Food in the Last Year: Never true    Ran Out of Food in the Last Year: Never true  Transportation Needs:  No Transportation Needs (01/11/2023)   PRAPARE - Administrator, Civil Service (Medical): No    Lack of Transportation (Non-Medical): No  Physical Activity: Not on file  Stress: Not on file  Social Connections: Not on file  Intimate Partner Violence: Not At Risk (01/11/2023)   Humiliation, Afraid, Rape, and Kick questionnaire    Fear of Current or Ex-Partner: No    Emotionally Abused: No    Physically Abused: No    Sexually Abused: No    Family History  Problem Relation Age of Onset   Hypertension Mother    Alcohol abuse Father    Anxiety disorder Sister    Prostate cancer Brother    Hypertension Sister    Breast cancer Sister 51   Breast cancer Cousin     Allergies  Allergen Reactions   Catfish [Fish Allergy] Nausea And Vomiting    Outpatient Medications Prior to Visit  Medication Sig   amLODipine  (NORVASC ) 5 MG tablet Take 1 tablet (5 mg total) by mouth daily.   buPROPion  (WELLBUTRIN ) 75 MG tablet Take 1 tablet (75 mg total) by  mouth daily with breakfast.   Cholecalciferol (D-3-5) 125 MCG (5000 UT) capsule Take 5,000 Units by mouth daily.   DULoxetine  (CYMBALTA ) 20 MG capsule TAKE 2 CAPSULES BY MOUTH DAILY   levothyroxine  (SYNTHROID ) 112 MCG tablet Take 1 tablet (112 mcg total) by mouth daily.   losartan-hydrochlorothiazide (HYZAAR) 100-25 MG tablet TAKE 1 TABLET BY MOUTH DAILY   rosuvastatin (CRESTOR) 20 MG tablet TAKE 1 TABLET BY MOUTH DAILY   tirzepatide  (MOUNJARO ) 7.5 MG/0.5ML Pen Inject 7.5 mg into the skin once a week.   zolpidem  (AMBIEN ) 5 MG tablet Take 1 tablet (5 mg total) by mouth at bedtime as needed for sleep.   B Complex-C (SUPER B COMPLEX PO) Take by mouth. (Patient not taking: Reported on 03/29/2023)   No facility-administered medications prior to visit.    Review of Systems  Constitutional: Negative.  Negative for chills, diaphoresis, fever and malaise/fatigue.  HENT: Negative.  Negative for congestion, nosebleeds and sinus pain.   Eyes:  Negative.   Respiratory: Negative.  Negative for cough and shortness of breath.   Cardiovascular: Negative.  Negative for chest pain, palpitations and leg swelling.  Gastrointestinal: Negative.  Negative for abdominal pain, constipation, diarrhea, heartburn, nausea and vomiting.  Genitourinary: Negative.  Negative for dysuria and flank pain.  Musculoskeletal: Negative.  Negative for joint pain and myalgias.  Skin: Negative.   Neurological: Negative.  Negative for dizziness, tingling, tremors and headaches.  Endo/Heme/Allergies: Negative.   Psychiatric/Behavioral: Negative.  Negative for depression and suicidal ideas. The patient is not nervous/anxious.        Objective:   BP (!) 144/86   Pulse 86   Ht 5' 3 (1.6 m)   Wt 192 lb (87.1 kg)   SpO2 96%   BMI 34.01 kg/m   Vitals:   03/29/23 1256  BP: (!) 144/86  Pulse: 86  Height: 5' 3 (1.6 m)  Weight: 192 lb (87.1 kg)  SpO2: 96%  BMI (Calculated): 34.02    Physical Exam Vitals and nursing note reviewed.  Constitutional:      Appearance: Normal appearance.  HENT:     Head: Normocephalic and atraumatic.     Nose: Nose normal.     Mouth/Throat:     Mouth: Mucous membranes are moist.     Pharynx: Oropharynx is clear.  Eyes:     Conjunctiva/sclera: Conjunctivae normal.     Pupils: Pupils are equal, round, and reactive to light.  Cardiovascular:     Rate and Rhythm: Normal rate and regular rhythm.     Pulses: Normal pulses.     Heart sounds: Normal heart sounds. No murmur heard. Pulmonary:     Effort: Pulmonary effort is normal.     Breath sounds: Normal breath sounds. No wheezing.  Abdominal:     General: Bowel sounds are normal.     Palpations: Abdomen is soft.     Tenderness: There is no abdominal tenderness. There is no right CVA tenderness or left CVA tenderness.  Musculoskeletal:        General: Normal range of motion.     Cervical back: Normal range of motion.     Right lower leg: No edema.     Left lower  leg: No edema.  Skin:    General: Skin is warm and dry.  Neurological:     General: No focal deficit present.     Mental Status: She is alert and oriented to person, place, and time.  Psychiatric:        Mood  and Affect: Mood normal.        Behavior: Behavior normal.      No results found for any visits on 03/29/23.  Recent Results (from the past 2160 hours)  CBC with Diff     Status: None   Collection Time: 01/04/23 11:48 AM  Result Value Ref Range   WBC 3.7 3.4 - 10.8 x10E3/uL   RBC 4.53 3.77 - 5.28 x10E6/uL   Hemoglobin 13.4 11.1 - 15.9 g/dL   Hematocrit 58.8 65.9 - 46.6 %   MCV 91 79 - 97 fL   MCH 29.6 26.6 - 33.0 pg   MCHC 32.6 31.5 - 35.7 g/dL   RDW 87.4 88.2 - 84.5 %   Platelets 251 150 - 450 x10E3/uL   Neutrophils 51 Not Estab. %   Lymphs 35 Not Estab. %   Monocytes 9 Not Estab. %   Eos 4 Not Estab. %   Basos 1 Not Estab. %   Neutrophils Absolute 1.9 1.4 - 7.0 x10E3/uL   Lymphocytes Absolute 1.3 0.7 - 3.1 x10E3/uL   Monocytes Absolute 0.3 0.1 - 0.9 x10E3/uL   EOS (ABSOLUTE) 0.1 0.0 - 0.4 x10E3/uL   Basophils Absolute 0.0 0.0 - 0.2 x10E3/uL   Immature Granulocytes 0 Not Estab. %   Immature Grans (Abs) 0.0 0.0 - 0.1 x10E3/uL  CMP14+EGFR     Status: Abnormal   Collection Time: 01/04/23 11:48 AM  Result Value Ref Range   Glucose 101 (H) 70 - 99 mg/dL   BUN 20 8 - 27 mg/dL   Creatinine, Ser 8.95 (H) 0.57 - 1.00 mg/dL   eGFR 58 (L) >40 fO/fpw/8.26   BUN/Creatinine Ratio 19 12 - 28   Sodium 143 134 - 144 mmol/L   Potassium 4.3 3.5 - 5.2 mmol/L   Chloride 104 96 - 106 mmol/L   CO2 23 20 - 29 mmol/L   Calcium 10.0 8.7 - 10.3 mg/dL   Total Protein 7.4 6.0 - 8.5 g/dL   Albumin 4.8 3.9 - 4.9 g/dL   Globulin, Total 2.6 1.5 - 4.5 g/dL   Bilirubin Total 0.3 0.0 - 1.2 mg/dL   Alkaline Phosphatase 85 44 - 121 IU/L   AST 29 0 - 40 IU/L   ALT 33 (H) 0 - 32 IU/L  Hemoglobin A1c     Status: Abnormal   Collection Time: 01/04/23 11:48 AM  Result Value Ref Range   Hgb  A1c MFr Bld 6.3 (H) 4.8 - 5.6 %    Comment:          Prediabetes: 5.7 - 6.4          Diabetes: >6.4          Glycemic control for adults with diabetes: <7.0    Est. average glucose Bld gHb Est-mCnc 134 mg/dL  Lipid Panel w/o Chol/HDL Ratio     Status: None   Collection Time: 01/04/23 11:48 AM  Result Value Ref Range   Cholesterol, Total 180 100 - 199 mg/dL   Triglycerides 888 0 - 149 mg/dL   HDL 62 >60 mg/dL   VLDL Cholesterol Cal 20 5 - 40 mg/dL   LDL Chol Calc (NIH) 98 0 - 99 mg/dL  [id number]     Status: None   Collection Time: 01/04/23 11:48 AM  Result Value Ref Range   TSH 0.929 0.450 - 4.500 uIU/mL   T3, Free 3.2 2.0 - 4.4 pg/mL   Free T4 1.51 0.82 - 1.77 ng/dL      Assessment &  Plan:  Continue current medications.  Monitor blood pressure.  Check labs in 2 weeks. Problem List Items Addressed This Visit     GAD (generalized anxiety disorder)   Hypothyroidism   Relevant Orders   TSH   Essential hypertension, benign   Relevant Orders   CMP14+EGFR   Mixed hyperlipidemia   Relevant Orders   Lipid Panel w/o Chol/HDL Ratio   Class 1 obesity due to excess calories without serious comorbidity in adult   Prediabetes - Primary   Relevant Orders   Hemoglobin A1c    Return in about 6 weeks (around 05/10/2023).   Total time spent: 30 minutes  FERNAND FREDY RAMAN, MD  03/29/2023   This document may have been prepared by [facility] Voice Recognition software and as such may include unintentional dictation errors.

## 2023-04-04 ENCOUNTER — Ambulatory Visit
Admission: RE | Admit: 2023-04-04 | Discharge: 2023-04-04 | Disposition: A | Discharge status: Home / Self Care | Payer: Medicare HMO | Source: Ambulatory Visit | Attending: Internal Medicine | Admitting: Internal Medicine

## 2023-04-04 DIAGNOSIS — Z1231 Encounter for screening mammogram for malignant neoplasm of breast: Secondary | ICD-10-CM | POA: Insufficient documentation

## 2023-04-09 ENCOUNTER — Other Ambulatory Visit: Payer: Self-pay | Admitting: Psychiatry

## 2023-04-09 DIAGNOSIS — F3342 Major depressive disorder, recurrent, in full remission: Secondary | ICD-10-CM

## 2023-04-13 ENCOUNTER — Other Ambulatory Visit: Payer: Medicare HMO

## 2023-04-13 DIAGNOSIS — R7303 Prediabetes: Secondary | ICD-10-CM | POA: Diagnosis not present

## 2023-04-13 DIAGNOSIS — I10 Essential (primary) hypertension: Secondary | ICD-10-CM | POA: Diagnosis not present

## 2023-04-13 DIAGNOSIS — E782 Mixed hyperlipidemia: Secondary | ICD-10-CM | POA: Diagnosis not present

## 2023-04-13 DIAGNOSIS — E039 Hypothyroidism, unspecified: Secondary | ICD-10-CM | POA: Diagnosis not present

## 2023-04-14 LAB — CMP14+EGFR
ALT: 26 IU/L (ref 0–32)
AST: 27 IU/L (ref 0–40)
Albumin: 5.3 g/dL — ABNORMAL HIGH (ref 3.9–4.9)
Alkaline Phosphatase: 87 IU/L (ref 44–121)
BUN/Creatinine Ratio: 15 (ref 12–28)
BUN: 28 mg/dL — ABNORMAL HIGH (ref 8–27)
Bilirubin Total: 0.5 mg/dL (ref 0.0–1.2)
CO2: 23 mmol/L (ref 20–29)
Calcium: 9.9 mg/dL (ref 8.7–10.3)
Chloride: 99 mmol/L (ref 96–106)
Creatinine, Ser: 1.85 mg/dL — ABNORMAL HIGH (ref 0.57–1.00)
Globulin, Total: 2.3 g/dL (ref 1.5–4.5)
Glucose: 103 mg/dL — ABNORMAL HIGH (ref 70–99)
Potassium: 3.9 mmol/L (ref 3.5–5.2)
Sodium: 138 mmol/L (ref 134–144)
Total Protein: 7.6 g/dL (ref 6.0–8.5)
eGFR: 29 mL/min/{1.73_m2} — ABNORMAL LOW (ref >59)

## 2023-04-14 LAB — LIPID PANEL W/O CHOL/HDL RATIO
Cholesterol, Total: 178 mg/dL (ref 100–199)
HDL: 60 mg/dL (ref >39)
LDL Chol Calc (NIH): 99 mg/dL (ref 0–99)
Triglycerides: 108 mg/dL (ref 0–149)
VLDL Cholesterol Cal: 19 mg/dL (ref 5–40)

## 2023-04-14 LAB — HEMOGLOBIN A1C
Est. average glucose Bld gHb Est-mCnc: 123 mg/dL
Hgb A1c MFr Bld: 5.9 % — ABNORMAL HIGH (ref 4.8–5.6)

## 2023-04-14 LAB — TSH: TSH: 2.06 u[IU]/mL (ref 0.450–4.500)

## 2023-04-16 ENCOUNTER — Other Ambulatory Visit: Payer: Self-pay | Admitting: Cardiology

## 2023-04-16 DIAGNOSIS — I10 Essential (primary) hypertension: Secondary | ICD-10-CM

## 2023-04-23 DIAGNOSIS — G4733 Obstructive sleep apnea (adult) (pediatric): Secondary | ICD-10-CM | POA: Diagnosis not present

## 2023-05-10 ENCOUNTER — Encounter: Payer: Self-pay | Admitting: Internal Medicine

## 2023-05-10 ENCOUNTER — Other Ambulatory Visit: Payer: Self-pay | Admitting: Internal Medicine

## 2023-05-10 ENCOUNTER — Ambulatory Visit (INDEPENDENT_AMBULATORY_CARE_PROVIDER_SITE_OTHER): Payer: Medicare HMO | Admitting: Internal Medicine

## 2023-05-10 VITALS — BP 110/74 | HR 96 | Ht 63.0 in | Wt 183.2 lb

## 2023-05-10 DIAGNOSIS — E66811 Obesity, class 1: Secondary | ICD-10-CM

## 2023-05-10 DIAGNOSIS — I10 Essential (primary) hypertension: Secondary | ICD-10-CM

## 2023-05-10 DIAGNOSIS — R7303 Prediabetes: Secondary | ICD-10-CM

## 2023-05-10 DIAGNOSIS — F411 Generalized anxiety disorder: Secondary | ICD-10-CM | POA: Diagnosis not present

## 2023-05-10 DIAGNOSIS — E039 Hypothyroidism, unspecified: Secondary | ICD-10-CM | POA: Diagnosis not present

## 2023-05-10 DIAGNOSIS — E782 Mixed hyperlipidemia: Secondary | ICD-10-CM | POA: Diagnosis not present

## 2023-05-10 NOTE — Progress Notes (Signed)
 Established Patient Office Visit  Subjective:  Patient ID: Shannon Crawford, female    DOB: 05-30-1955  Age: 68 y.o. MRN: [id number]  Chief Complaint  Patient presents with   Follow-up    6 week follow up    Patient comes in for her follow-up.  She has lost more weight on Mounjaro 7.5.  Denies any nausea or vomiting but gets occasional constipation.  She has been instructed to drink more water and also to add FiberCon to her diet.  Her last labs showed an elevated creatinine, patient was advised to cut back on her ibuprofen.  Will repeat today.  Her mammogram is now up to date.  Will stay at the current dose of Mounjaro at 7.5 mg/week.  Continue to monitor blood pressure.    No other concerns at this time.   Past Medical History:  Diagnosis Date   Anxiety    Fracture    ankle   Heart murmur    Hypothyroid    Impaired glucose tolerance    Mixed hyperlipidemia    OSA on CPAP    Primary vitiligo    Thyroid disease     Past Surgical History:  Procedure Laterality Date   ABDOMINAL SURGERY     tummy tuck   BREAST BIOPSY Left 10+ years   Negative- core bx    Social History   Socioeconomic History   Marital status: Married    Spouse name: John   Number of children: 2   Years of education: Not on file   Highest education level: Some college, no degree  Occupational History   Not on file  Tobacco Use   Smoking status: Never   Smokeless tobacco: Never  Vaping Use   Vaping status: Never Used  Substance and Sexual Activity   Alcohol use: Yes    Alcohol/week: 12.0 standard drinks of alcohol    Types: 12 Cans of beer per week   Drug use: No   Sexual activity: Yes  Other Topics Concern   Not on file  Social History Narrative   Not on file   Social Drivers of Health   Financial Resource Strain: Not on file  Food Insecurity: No Food Insecurity (01/11/2023)   Hunger Vital Sign    Worried About Running Out of Food in the Last Year: Never true    Ran Out of Food in  the Last Year: Never true  Transportation Needs: No Transportation Needs (01/11/2023)   PRAPARE - Administrator, Civil Service (Medical): No    Lack of Transportation (Non-Medical): No  Physical Activity: Not on file  Stress: Not on file  Social Connections: Not on file  Intimate Partner Violence: Not At Risk (01/11/2023)   Humiliation, Afraid, Rape, and Kick questionnaire    Fear of Current or Ex-Partner: No    Emotionally Abused: No    Physically Abused: No    Sexually Abused: No    Family History  Problem Relation Age of Onset   Hypertension Mother    Alcohol abuse Father    Anxiety disorder Sister    Prostate cancer Brother    Hypertension Sister    Breast cancer Sister 73   Breast cancer Cousin     Allergies  Allergen Reactions   Catfish [Fish Allergy] Nausea And Vomiting    Outpatient Medications Prior to Visit  Medication Sig   amLODipine (NORVASC) 5 MG tablet Take 1 tablet (5 mg total) by mouth daily.   buPROPion [facility])  75 MG tablet Take 1 tablet (75 mg total) by mouth daily with breakfast.   Cholecalciferol (D-3-5) 125 MCG (5000 UT) capsule Take 5,000 Units by mouth daily.   DULoxetine (CYMBALTA) 20 MG capsule TAKE 2 CAPSULES BY MOUTH DAILY   levothyroxine (SYNTHROID) 112 MCG tablet Take 1 tablet (112 mcg total) by mouth daily.   losartan-hydrochlorothiazide (HYZAAR) 100-25 MG tablet TAKE 1 TABLET BY MOUTH DAILY   MOUNJARO 7.5 MG/0.5ML Pen INJECT 7.5 MG UNDER THE SKIN ONCE WEEKLY   rosuvastatin (CRESTOR) 20 MG tablet TAKE 1 TABLET BY MOUTH DAILY   zolpidem (AMBIEN) 5 MG tablet Take 1 tablet (5 mg total) by mouth at bedtime as needed. for sleep   B Complex-C (SUPER B COMPLEX PO) Take by mouth. (Patient not taking: Reported on 05/10/2023)   No facility-administered medications prior to visit.    Review of Systems  Constitutional:  Positive for weight loss. Negative for chills, diaphoresis, fever and malaise/fatigue.  HENT: Negative.  Negative  for congestion and sore throat.   Eyes: Negative.   Respiratory: Negative.  Negative for cough and shortness of breath.   Cardiovascular: Negative.  Negative for chest pain, palpitations and leg swelling.  Gastrointestinal: Negative.  Negative for abdominal pain, constipation, diarrhea, heartburn, nausea and vomiting.  Genitourinary: Negative.  Negative for dysuria and flank pain.  Musculoskeletal: Negative.  Negative for joint pain and myalgias.  Skin: Negative.   Neurological: Negative.  Negative for dizziness and headaches.  Endo/Heme/Allergies: Negative.   Psychiatric/Behavioral: Negative.  Negative for depression and suicidal ideas. The patient is not nervous/anxious.        Objective:   BP 110/74   Pulse 96   Ht 5\' 3"  (1.6 m)   Wt 183 lb 3.2 oz (83.1 kg)   SpO2 96%   BMI 32.45 kg/m   Vitals:   05/10/23 1258  BP: 110/74  Pulse: 96  Height: 5\' 3"  (1.6 m)  Weight: 183 lb 3.2 oz (83.1 kg)  SpO2: 96%  BMI (Calculated): 32.46    Physical Exam Vitals and nursing note reviewed.  Constitutional:      Appearance: Normal appearance.  HENT:     Head: Normocephalic and atraumatic.     Nose: Nose normal.     Mouth/Throat:     Mouth: Mucous membranes are moist.     Pharynx: Oropharynx is clear.  Eyes:     Conjunctiva/sclera: Conjunctivae normal.     Pupils: Pupils are equal, round, and reactive to light.  Cardiovascular:     Rate and Rhythm: Normal rate and regular rhythm.     Pulses: Normal pulses.     Heart sounds: Normal heart sounds. No murmur heard. Pulmonary:     Effort: Pulmonary effort is normal.     Breath sounds: Normal breath sounds. No wheezing.  Abdominal:     General: Bowel sounds are normal.     Palpations: Abdomen is soft.     Tenderness: There is no abdominal tenderness. There is no right CVA tenderness or left CVA tenderness.  Musculoskeletal:        General: Normal range of motion.     Cervical back: Normal range of motion.     Right lower leg:  No edema.     Left lower leg: No edema.  Skin:    General: Skin is warm and dry.  Neurological:     General: No focal deficit present.     Mental Status: She is alert and oriented to person, place, and time.  Psychiatric:        Mood and Affect: Mood normal.        Behavior: Behavior normal.      No results found for any visits on 05/10/23.  Recent Results (from the past 2160 hours)  CMP14+EGFR     Status: Abnormal   Collection Time: 04/13/23 11:05 AM  Result Value Ref Range   Glucose 103 (H) 70 - 99 mg/dL   BUN 28 (H) 8 - 27 mg/dL   Creatinine, Ser 1.61 (H) 0.57 - 1.00 mg/dL   eGFR 29 (L) >09 UE/AVW/0.98   BUN/Creatinine Ratio 15 12 - 28   Sodium 138 134 - 144 mmol/L   Potassium 3.9 3.5 - 5.2 mmol/L   Chloride 99 96 - 106 mmol/L   CO2 23 20 - 29 mmol/L   Calcium 9.9 8.7 - 10.3 mg/dL   Total Protein 7.6 6.0 - 8.5 g/dL   Albumin 5.3 (H) 3.9 - 4.9 g/dL   Globulin, Total 2.3 1.5 - 4.5 g/dL   Bilirubin Total 0.5 0.0 - 1.2 mg/dL   Alkaline Phosphatase 87 44 - 121 IU/L   AST 27 0 - 40 IU/L   ALT 26 0 - 32 IU/L  Lipid Panel w/o Chol/HDL Ratio     Status: None   Collection Time: 04/13/23 11:05 AM  Result Value Ref Range   Cholesterol, Total 178 100 - 199 mg/dL   Triglycerides 119 0 - 149 mg/dL   HDL 60 >14 mg/dL   VLDL Cholesterol Cal 19 5 - 40 mg/dL   LDL Chol Calc (NIH) 99 0 - 99 mg/dL  TSH     Status: None   Collection Time: 04/13/23 11:05 AM  Result Value Ref Range   TSH 2.060 0.450 - 4.500 uIU/mL  Hemoglobin A1c     Status: Abnormal   Collection Time: 04/13/23 11:05 AM  Result Value Ref Range   Hgb A1c MFr Bld 5.9 (H) 4.8 - 5.6 %    Comment:          Prediabetes: 5.7 - 6.4          Diabetes: >6.4          Glycemic control for adults with diabetes: <7.0    Est. average glucose Bld gHb Est-mCnc 123 mg/dL      Assessment & Plan:  Continue current medications.  To drink more water.  Monitor blood pressure and creatinine. Problem List Items Addressed This Visit      GAD (generalized anxiety disorder)   Hypothyroidism   Essential hypertension, benign - Primary   Relevant Orders   Basic metabolic panel   Mixed hyperlipidemia   Prediabetes    Return in about 2 months (around 07/10/2023).   Total time spent: 30 minutes  Margaretann Loveless, MD  05/10/2023   This document may have been prepared by [facility] Voice Recognition software and as such may include unintentional dictation errors.

## 2023-05-11 LAB — BASIC METABOLIC PANEL
BUN/Creatinine Ratio: 15 (ref 12–28)
BUN: 19 mg/dL (ref 8–27)
CO2: 23 mmol/L (ref 20–29)
Calcium: 10 mg/dL (ref 8.7–10.3)
Chloride: 104 mmol/L (ref 96–106)
Creatinine, Ser: 1.23 mg/dL — ABNORMAL HIGH (ref 0.57–1.00)
Glucose: 97 mg/dL (ref 70–99)
Potassium: 4.2 mmol/L (ref 3.5–5.2)
Sodium: 143 mmol/L (ref 134–144)
eGFR: 47 mL/min/{1.73_m2} — ABNORMAL LOW (ref >59)

## 2023-05-15 NOTE — Progress Notes (Signed)
 Patient notified

## 2023-05-25 ENCOUNTER — Other Ambulatory Visit: Payer: Self-pay | Admitting: Psychiatry

## 2023-05-25 DIAGNOSIS — F411 Generalized anxiety disorder: Secondary | ICD-10-CM

## 2023-05-25 DIAGNOSIS — F332 Major depressive disorder, recurrent severe without psychotic features: Secondary | ICD-10-CM

## 2023-07-02 ENCOUNTER — Other Ambulatory Visit: Payer: Self-pay | Admitting: Internal Medicine

## 2023-07-02 DIAGNOSIS — E66811 Obesity, class 1: Secondary | ICD-10-CM

## 2023-07-03 ENCOUNTER — Ambulatory Visit: Payer: Self-pay | Admitting: Psychiatry

## 2023-07-03 ENCOUNTER — Encounter: Payer: Self-pay | Admitting: Psychiatry

## 2023-07-03 VITALS — BP 118/78 | HR 94 | Temp 98.8°F | Ht 63.0 in | Wt 183.2 lb

## 2023-07-03 DIAGNOSIS — F411 Generalized anxiety disorder: Secondary | ICD-10-CM | POA: Diagnosis not present

## 2023-07-03 DIAGNOSIS — F330 Major depressive disorder, recurrent, mild: Secondary | ICD-10-CM

## 2023-07-03 MED ORDER — BUPROPION HCL 75 MG PO TABS
75.0000 mg | ORAL_TABLET | Freq: Every day | ORAL | 1 refills | Status: AC
Start: 2023-07-03 — End: ?

## 2023-07-03 MED ORDER — DULOXETINE HCL 60 MG PO CPEP: 60.0000 mg | ORAL_CAPSULE | Freq: Every day | ORAL | 1 refills | Status: DC

## 2023-07-03 NOTE — Progress Notes (Signed)
 [facility] MD OP Progress Note  07/03/2023 1:39 PM Shannon Crawford  MRN:  [id number]  Chief Complaint:  Chief Complaint  Patient presents with   Follow-up   Depression   Anxiety   Medication Refill   Discussed the use of AI scribe software for clinical note transcription with the patient, who gave verbal consent to proceed.  History of Present Illness Shannon Crawford is a 68 year old biracial female who is married, lives in [place], has a history of GAD, MDD, chronic pain syndrome, vitiligo, hypertension, hypothyroidism, hyperlipidemia was evaluated in office today for a follow-up appointment.  She experiences significant anxiety and depression, with more 'downs' than 'ups'. Morning depression is particularly notable, with feelings of low mood upon waking. These symptoms have persisted for a long time, predating her current medication regimen. She has a persistent worry about living only until age 75, though the reason for this fixation is unclear.  She has been on Mounjaro for about five to six months, which has helped reduce her appetite and resulted in some weight loss, though not as much as desired. Her morning depression has been present even before starting Mounjaro. She is currently taking Cymbalta  40 mg daily, which helps with her anxiety, and Wellbutrin  75 mg. She occasionally uses Ambien  for sleep.  Her sleep routine involves going to bed around 11:30 PM, often watching TV or listening to a podcast to fall asleep, and waking up between 7:15 and 8:00 AM on days she attends exercise class. She has difficulty falling asleep but usually stays asleep once she does, except for bathroom trips. She uses a CPAP machine.  She expresses significant worry about her husband's health, particularly his high blood pressure, and her dog's recent illness. Her dog, whom she is very close to, was not eating for two to three days, causing her distress. She also worries about being alone, as her husband does not  want another dog and her sisters have both lost their husbands.  She has a history of being a 'worrier', a trait she recalls from childhood, as her mother used to call her a 'worry wart'. She has been with her husband since about 31 and is concerned about the possibility of being alone in the future.  She currently denies any suicidality, homicidality or perceptual disturbances.  Visit Diagnosis:    ICD-10-CM   1. GAD (generalized anxiety disorder)  F41.1 buPROPion  (WELLBUTRIN ) 75 MG tablet    DULoxetine  (CYMBALTA ) 60 MG capsule    2. MDD (major depressive disorder), recurrent episode, mild (HCC)  F33.0       Past Psychiatric History: I have reviewed past psychiatric history from progress note on 03/29/2021.  Past trials of medications like Lexapro , Zoloft, duloxetine , zolpidem , Rexulti , Xanax , Topamax .  Past Medical History:  Past Medical History:  Diagnosis Date   Anxiety    Fracture    ankle   Heart murmur    Hypothyroid    Impaired glucose tolerance    Mixed hyperlipidemia    OSA on CPAP    Primary vitiligo    Thyroid  disease     Past Surgical History:  Procedure Laterality Date   ABDOMINAL SURGERY     tummy tuck   BREAST BIOPSY Left 10+ years   Negative- core bx    Family Psychiatric History: I have reviewed family psychiatric history from progress note on 03/29/2021.  Family History:  Family History  Problem Relation Age of Onset   Hypertension Mother    Alcohol  abuse Father    Anxiety disorder Sister    Prostate cancer Brother    Hypertension Sister    Breast cancer Sister 2   Breast cancer Cousin     Social History: I have reviewed social history from progress note on 03/29/2021. Social History   Socioeconomic History   Marital status: Married    Spouse name: John   Number of children: 2   Years of education: Not on file   Highest education level: Some college, no degree  Occupational History   Not on file  Tobacco Use   Smoking status: Never    Smokeless tobacco: Never  Vaping Use   Vaping status: Never Used  Substance and Sexual Activity   Alcohol use: Yes    Alcohol/week: 12.0 standard drinks of alcohol    Types: 12 Cans of beer per week   Drug use: No   Sexual activity: Yes  Other Topics Concern   Not on file  Social History Narrative   Not on file   Social Drivers of Health   Financial Resource Strain: Not on file  Food Insecurity: No Food Insecurity (01/11/2023)   Hunger Vital Sign    Worried About Running Out of Food in the Last Year: Never true    Ran Out of Food in the Last Year: Never true  Transportation Needs: No Transportation Needs (01/11/2023)   PRAPARE - Administrator, Civil Service (Medical): No    Lack of Transportation (Non-Medical): No  Physical Activity: Not on file  Stress: Not on file  Social Connections: Not on file    Allergies:  Allergies  Allergen Reactions   Catfish [Fish Allergy] Nausea And Vomiting    Metabolic Disorder Labs: Lab Results  Component Value Date   HGBA1C 5.9 (H) 04/13/2023   No results found for: "PROLACTIN" Lab Results  Component Value Date   CHOL 178 04/13/2023   TRIG 108 04/13/2023   HDL 60 04/13/2023   LDLCALC 99 04/13/2023   LDLCALC 98 01/04/2023   Lab Results  Component Value Date   TSH 2.060 04/13/2023   TSH 0.929 01/04/2023    Therapeutic Level Labs: No results found for: "LITHIUM" No results found for: "VALPROATE" No results found for: "CBMZ"  Current Medications: Current Outpatient Medications  Medication Sig Dispense Refill   amLODipine  (NORVASC ) 5 MG tablet Take 1 tablet (5 mg total) by mouth daily. 30 tablet 11   B Complex-C (SUPER B COMPLEX PO) Take by mouth.     Cholecalciferol (D-3-5) 125 MCG (5000 UT) capsule Take 5,000 Units by mouth daily.     DULoxetine  (CYMBALTA ) 60 MG capsule Take 1 capsule (60 mg total) by mouth daily. 90 capsule 1   levothyroxine  (SYNTHROID ) 112 MCG tablet Take 1 tablet (112 mcg total) by mouth  daily. 30 tablet 11   losartan-hydrochlorothiazide (HYZAAR) 100-25 MG tablet TAKE 1 TABLET BY MOUTH DAILY 90 tablet 3   MOUNJARO 7.5 MG/0.5ML Pen INJECT 7.5 MG UNDER THE SKIN ONCE WEEKLY 2 mL 1   rosuvastatin (CRESTOR) 20 MG tablet TAKE 1 TABLET BY MOUTH DAILY 90 tablet 3   zolpidem  (AMBIEN ) 5 MG tablet Take 1 tablet (5 mg total) by mouth at bedtime as needed. for sleep 30 tablet 2   buPROPion  (WELLBUTRIN ) 75 MG tablet Take 1 tablet (75 mg total) by mouth daily with breakfast. 90 tablet 1   No current facility-administered medications for this visit.     Musculoskeletal: Strength & Muscle Tone: within normal limits  Gait & Station: normal Patient leans: N/A  Psychiatric Specialty Exam: Review of Systems  Psychiatric/Behavioral:  Positive for dysphoric mood and sleep disturbance. The patient is nervous/anxious.     Blood pressure 118/78, pulse 94, temperature 98.8 F (37.1 C), temperature source Temporal, height 5\' 3"  (1.6 m), weight 183 lb 3.2 oz (83.1 kg), SpO2 98%.Body mass index is 32.45 kg/m.  General Appearance: Casual  Eye Contact:  Fair  Speech:  Clear and Coherent  Volume:  Normal  Mood:  Anxious and Dysphoric  Affect:  Appropriate  Thought Process:  Goal Directed and Descriptions of Associations: Intact  Orientation:  Full (Time, Place, and Person)  Thought Content: Logical   Suicidal Thoughts:  No  Homicidal Thoughts:  No  Memory:  Immediate;   Fair Recent;   Fair Remote;   Fair  Judgement:  Fair  Insight:  Fair  Psychomotor Activity:  Normal  Concentration:  Concentration: Fair and Attention Span: Fair  Recall:  [employer] of Knowledge: Fair  Language: Fair  Akathisia:  No  Handed:  Right  AIMS (if indicated): not done  Assets:  Communication Skills Desire for Improvement Housing Intimacy Social Support Transportation  ADL's:  Intact  Cognition: WNL  Sleep:  varies due to lack of sleep hygiene   Screenings: Geneticist, molecular Office Visit from  09/29/2021 in [facility] Office Visit from 07/14/2021 in [facility]  AIMS Total Score 0 0      AUDIT    Flowsheet Row Counselor from 04/06/2022 in [facility] Visit from 03/29/2021 in [facility]  Alcohol Use Disorder Identification Test Final Score (AUDIT) 15 13      GAD-7    Flowsheet Row Office Visit from 07/03/2023 in [facility] Office Visit from 05/10/2023 in [facility] Office Visit from 01/23/2023 in [facility] Office Visit from 01/11/2023 in [facility] Office Visit from 10/19/2022 in [facility]  Total GAD-7 Score 10 1 1 2  0      PHQ2-9    Flowsheet Row Office Visit from 07/03/2023 in [facility] Office Visit from 05/10/2023 in [facility] Office Visit from 01/23/2023 in [facility] Office Visit from 01/11/2023 in [facility] Office Visit from 10/19/2022 in [facility]  PHQ-2 Total Score 2 2 0 1 0  PHQ-9 Total Score 8 7 -- 7 1      Flowsheet Row Office Visit from 07/03/2023 in [facility] Office Visit from 01/23/2023 in [facility] ED from 12/16/2022 in [facility]  C-SSRS RISK CATEGORY No Risk No Risk No Risk        Assessment and Plan: Shannon Crawford is a 68 year old female, married, lives in [place], has a history of depression, anxiety, vitiligo, hypothyroidism, chronic pain was evaluated in office today.  Discussed assessment and plan as noted below.  Generalized anxiety  disorder-unstable Increased anxiety due to multiple situational stressors including dog's illness, her husband's health issues.  Agreeable to dosage increase of Cymbalta .  Agreeable to trial of CBT. - Increase Cymbalta  to 60 mg daily - Referral for CBT-Provided Resources in [employer].  MDD-unstable Currently reports depression symptoms ongoing since the past  several weeks. - Increase Cymbalta  as noted above. - Continue Wellbutrin  75 mg daily - Continue Zolpidem  5 mg at bedtime as needed for sleep - Discussed with patient the effect of Mounjaro on her mood symptoms as well.  Patient to discuss with her provider. - Advised to establish care with the therapist, provided resources in the community. - Advised to work on sleep hygiene.  Follow-up Follow-up in clinic in 6 weeks or sooner if needed.   Collaboration of Care: Collaboration of Care: Referral or follow-up with counselor/therapist AEB patient encouraged to establish care with therapist.  Patient/Guardian was advised Release of Information must be obtained prior to any record release in order to collaborate their care with an outside provider. Patient/Guardian was advised if they have not already done so to contact the registration department to sign all necessary forms in order for us  to release information regarding their care.   Consent: Patient/Guardian gives verbal consent for treatment and assignment of benefits for services provided during this visit. Patient/Guardian expressed understanding and agreed to proceed.   This note was generated in part or whole with voice recognition software. Voice recognition is usually quite accurate but there are transcription errors that can and very often do occur. I apologize for any typographical errors that were not detected and corrected.    Shakina Choy, MD 07/03/2023, 1:39 PM

## 2023-07-03 NOTE — Patient Instructions (Addendum)
  [email or web address]  www.[email or web address]  [email or web address] [facility] [address], [place], [place] [address]   [phone]  [facility] [email or web address] [address], [place], [place] [address]  [phone]  [facility] [email or web address] [address] [place], [place] [address]  [phone]   [facility] - [phone]  [facility] - [phone]  [facility] - [phone] counseling [phone]  [facility] [phone]   [email or web address] this clinician can offer telehealth and has a sliding scale option  [email or web address] this group also offers sliding scale rates and is based out of [place]  [employer] [facility] has interns who offer sliding scale rates and some of the full time clinicians do, as well. You complete their contact form on their website and the referrals coordinator will help to get connected to someone   [email or web address] [phone]  Medicaid below :  [facility] [address] [place], [place] [address]  [phone]    [employer] [address] [place], [place] [address]  [phone]    [facility] [address] [place], [place] [address]  [phone]

## 2023-07-10 ENCOUNTER — Ambulatory Visit: Admitting: Internal Medicine

## 2023-08-01 ENCOUNTER — Other Ambulatory Visit: Payer: Self-pay | Admitting: Internal Medicine

## 2023-08-01 DIAGNOSIS — I10 Essential (primary) hypertension: Secondary | ICD-10-CM

## 2023-08-08 ENCOUNTER — Encounter: Payer: Self-pay | Admitting: Psychiatry

## 2023-08-08 ENCOUNTER — Ambulatory Visit: Admitting: Psychiatry

## 2023-08-08 VITALS — BP 122/84 | HR 96 | Temp 98.2°F | Ht 63.0 in | Wt 183.6 lb

## 2023-08-08 DIAGNOSIS — F330 Major depressive disorder, recurrent, mild: Secondary | ICD-10-CM | POA: Diagnosis not present

## 2023-08-08 DIAGNOSIS — F411 Generalized anxiety disorder: Secondary | ICD-10-CM

## 2023-08-08 DIAGNOSIS — Z789 Other specified health status: Secondary | ICD-10-CM

## 2023-08-08 MED ORDER — BUPROPION HCL ER (XL) 150 MG PO TB24: 150.0000 mg | ORAL_TABLET | Freq: Every day | ORAL | 0 refills | Status: DC

## 2023-08-08 MED ORDER — ZOLPIDEM TARTRATE 5 MG PO TABS: 5.0000 mg | ORAL_TABLET | Freq: Every evening | ORAL | 2 refills | Status: DC | PRN

## 2023-08-08 NOTE — Progress Notes (Unsigned)
 BH MD OP Progress Note  08/08/2023 1:21 PM Shannon Crawford  MRN:  [id number]  Chief Complaint:  Chief Complaint  Patient presents with   Follow-up   Depression   Anxiety   Medication Refill   Discussed the use of AI scribe software for clinical note transcription with the patient, who gave verbal consent to proceed.  History of Present Illness Shannon Crawford is a 68 year old biracial female, married, lives in [place], has a history of GAD, MDD, chronic pain syndrome, vitiligo, hypertension, hypothyroidism, hyperlipidemia was evaluated in office today for a follow-up appointment.    Since her last visit six weeks ago, she has experienced fewer periods of sadness following an increase in her medication dosage. However, she continues to struggle with motivation and interest in activities, particularly quilting, a hobby she enjoys. She was able to participate in a quilt retreat from June 4th for four days, where she was productive, but has not been able to continue this at home.  She describes ongoing stress related to her dog's health, which requires surgery, and feels preoccupied with other responsibilities. Despite this, she maintains her exercise routine, attending classes and walking with her husband and dog. She is able to keep up with household tasks.  She has difficulty initiating sleep, often not falling asleep until 1:00 AM, but once asleep, she stays asleep until around 8:30 AM, getting approximately seven to eight hours of sleep. She uses Ambien  (Zolpidem ) for sleep, with only five pills remaining and no refills.  Denies current suicidal thoughts, with the last occurrence before her previous appointment more than a month ago.  Her current medications include Wellbutrin  (Bupropion ) at 75 mg, taken in the morning. She recalls that it initially helped with her symptoms. She consumes alcohol, averaging two beers per day, and last used CBD about a month ago.  She is interested in cutting  back on the alcohol use however believes the alcohol helps with anxiety.  She was referred to a therapist previously however has not been able to establish care with anyone yet.  She mentions a family history of ADHD, as her daughter has been diagnosed and is on medication for it. 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    Visit Diagnosis:    ICD-10-CM   1. GAD (generalized anxiety disorder)  F41.1 zolpidem  (AMBIEN ) 5 MG tablet    2. MDD (major depressive disorder), recurrent episode, mild (HCC)  F33.0 buPROPion  (WELLBUTRIN  XL) 150 MG 24 hr tablet    3. Significant use of alcohol  Z78.9       Past Psychiatric History: I have reviewed past psychiatric history from progress note on 03/29/2021.  Past trials of medications like Lexapro , Zoloft, duloxetine , Rexulti , zolpidem , Xanax , Topamax .  Past Medical History:  Past Medical History:  Diagnosis Date   Anxiety    Fracture    ankle   Heart murmur    Hypothyroid    Impaired glucose tolerance    Mixed hyperlipidemia    OSA on CPAP    Primary vitiligo    Thyroid  disease     Past Surgical History:  Procedure Laterality Date   ABDOMINAL SURGERY     tummy tuck   BREAST BIOPSY Left 10+ years   Negative- core bx    Family Psychiatric History: I have reviewed family psychiatric history from progress note on 03/29/2021.  Family History:  Family History  Problem Relation Age of Onset   Hypertension Mother    Alcohol abuse Father    Anxiety disorder Sister    Prostate cancer Brother    Hypertension Sister    Breast cancer Sister 52   Breast cancer Cousin     Social History: I have reviewed social history from progress note on 03/29/2021. Social History   Socioeconomic History   Marital status: Married    Spouse name: Shannon Crawford   Number of children: 2   Years of education: Not on file   Highest education  level: Some college, no degree  Occupational History   Not on file  Tobacco Use   Smoking status: Never   Smokeless tobacco: Never  Vaping Use   Vaping status: Never Used  Substance and Sexual Activity   Alcohol use: Yes    Alcohol/week: 12.0 standard drinks of alcohol    Types: 12 Cans of beer per week   Drug use: No   Sexual activity: Yes  Other Topics Concern   Not on file  Social History Narrative   Not on file   Social Drivers of Health   Financial Resource Strain: Not on file  Food Insecurity: No Food Insecurity (01/11/2023)   Hunger Vital Sign    Worried About Running Out of Food in the Last Year: Never true    Ran Out of Food in the Last Year: Never true  Transportation Needs: No Transportation Needs (01/11/2023)   PRAPARE - Administrator, Civil Service (Medical): No    Lack of Transportation (Non-Medical): No  Physical Activity: Not on file  Stress: Not on file  Social Connections: Not on file    Allergies:  Allergies  Allergen Reactions   Catfish [Fish Allergy] Nausea And Vomiting    Metabolic Disorder Labs: Lab Results  Component Value Date   HGBA1C 5.9 (H) 04/13/2023   No results found for: PROLACTIN Lab Results  Component Value Date   CHOL 178 04/13/2023   TRIG 108 04/13/2023   HDL 60 04/13/2023   LDLCALC 99 04/13/2023   LDLCALC 98 01/04/2023   Lab Results  Component Value Date   TSH 2.060 04/13/2023   TSH 0.929 01/04/2023    Therapeutic Level Labs: No results found for: LITHIUM No results found for: VALPROATE No results found for: CBMZ  Current Medications: Current Outpatient Medications  Medication Sig Dispense Refill   amLODipine  (NORVASC ) 5 MG tablet Take 1 tablet (5 mg total) by mouth daily. 30 tablet 11   B Complex-C (SUPER B COMPLEX PO) Take by mouth.     buPROPion  (WELLBUTRIN  XL) 150 MG 24 hr tablet Take 1 tablet (150 mg total) by mouth daily with breakfast. 90 tablet 0   Cholecalciferol (D-3-5) 125 MCG  (5000 UT) capsule Take 5,000 Units by mouth daily.     DULoxetine  (CYMBALTA ) 60 MG capsule Take 1 capsule (60 mg total) by mouth daily. 90 capsule 1   levothyroxine  (SYNTHROID ) 112 MCG tablet Take 1 tablet (112 mcg total) by mouth daily. 30 tablet 11   losartan-hydrochlorothiazide (HYZAAR) 100-25 MG tablet TAKE 1 TABLET BY MOUTH DAILY 90 tablet 3   MOUNJARO  7.5 MG/0.5ML Pen INJECT 7.5 MG UNDER THE SKIN ONCE WEEKLY 2 mL 1   rosuvastatin (CRESTOR) 20 MG tablet TAKE 1 TABLET BY MOUTH DAILY 90 tablet 3   zolpidem  (AMBIEN ) 5 MG tablet Take 1 tablet (5 mg total) by mouth at bedtime as needed. for sleep 30 tablet  2   No current facility-administered medications for this visit.     Musculoskeletal: Strength & Muscle Tone: within normal limits Gait & Station: normal Patient leans: N/A  Psychiatric Specialty Exam: Review of Systems  Psychiatric/Behavioral:  Positive for decreased concentration and dysphoric mood. The patient is nervous/anxious.     Blood pressure 122/84, pulse 96, temperature 98.2 F (36.8 C), temperature source Temporal, height 5' 3 (1.6 m), weight 183 lb 9.6 oz (83.3 kg), SpO2 98%.Body mass index is 32.52 kg/m.  General Appearance: Casual  Eye Contact:  Fair  Speech:  Clear and Coherent  Volume:  Normal  Mood:  Anxious and Dysphoric  Affect:  Congruent  Thought Process:  Goal Directed and Descriptions of Associations: Intact  Orientation:  Full (Time, Place, and Person)  Thought Content: Logical   Suicidal Thoughts:  No  Homicidal Thoughts:  No  Memory:  Immediate;   Fair Recent;   Fair Remote;   Fair  Judgement:  Fair  Insight:  Fair  Psychomotor Activity:  Normal  Concentration:  Concentration: Fair and Attention Span: Fair  Recall:  [employer] of Knowledge: Fair  Language: Fair  Akathisia:  No  Handed:  Right  AIMS (if indicated): not done  Assets:  Communication Skills Desire for Improvement Housing Talents/Skills Transportation  ADL's:  Intact   Cognition: WNL  Sleep:  Fair   Screenings: Midwife Visit from 09/29/2021 in [facility] Office Visit from 07/14/2021 in [facility]  AIMS Total Score 0 0   AUDIT    Flowsheet Row Counselor from 04/06/2022 in [facility] Visit from 03/29/2021 in [facility]  Alcohol Use Disorder Identification Test Final Score (AUDIT) 15 13   GAD-7    Flowsheet Row Office Visit from 07/03/2023 in [facility] Office Visit from 05/10/2023 in [facility] Office Visit from 01/23/2023 in [facility] Office Visit from 01/11/2023 in [facility] Office Visit from 10/19/2022 in [facility]  Total GAD-7 Score 10 1 1 2  0   PHQ2-9    Flowsheet Row Office Visit from 07/03/2023 in [facility] Office Visit from 05/10/2023 in [facility] Office Visit from 01/23/2023 in [facility] Office Visit from 01/11/2023 in [facility] Office Visit from 10/19/2022 in [facility]  PHQ-2 Total Score 2 2 0 1 0  PHQ-9 Total Score 8 7 -- 7 1   Flowsheet Row Office Visit from 08/08/2023 in [facility] Office Visit from 07/03/2023 in [facility] Office Visit from 01/23/2023 in [facility]  C-SSRS RISK CATEGORY No Risk No Risk No Risk     Assessment and Plan: LEITA LINDBLOOM is a 68 year old female, married, has a history of depression, anxiety, attention and focus deficit was evaluated in office today.  Discussed assessment and plan as  noted below.  Generalized anxiety disorder-improving Currently reports anxiety symptoms as improved although does report feeling more anxious when she does not drink alcohol on a daily basis.  Likely from alcohol withdrawal.  Patient has not been able to establish care with the therapist yet.  The higher dosage of Cymbalta  seems to be effective for anxiety. Continue Cymbalta  60 mg  daily Referred for CBT, encouraged to establish care.  MDD-unstable Currently reports lack of motivation especially with doing activities like quilting although recently she was in a quilting retreat and was able to participate.  She wonders if she has ADHD due to her lack of motivation and procrastination since her daughter was recently diagnosed.  She also reports having trouble when she was in school.  Although it did not affect her much during her carrier. Increase Wellbutrin  to XL 150 mg daily Continue zolpidem  5 mg at bedtime as needed, he uses it rarely. Reviewed [place] PMP AWARxE  Significant use of alcohol-unstable Currently reports drinking 2 beers daily and has increased anxiety when not drinking likely due to withdrawal. Advised abstinence. Discussed trial of medications for alcoholism if needed, patient to let this provider know. Discussed referral to [facility] substance abuse counseling program. Also provided resources in the community for substance use as well as for CBT.   Follow-up Follow-up in clinic in 6 to 7 weeks or sooner if needed.  Collaboration of Care: Collaboration of Care: Referral or follow-up with counselor/therapist AEB encouraged to establish care with therapist.  Patient/Guardian was advised Release of Information must be obtained prior to any record release in order to collaborate their care with an outside provider. Patient/Guardian was advised if they have not already done so to contact the registration department to sign all necessary forms in order for us  to release information  regarding their care.   Consent: Patient/Guardian gives verbal consent for treatment and assignment of benefits for services provided during this visit. Patient/Guardian expressed understanding and agreed to proceed.  This note was generated in part or whole with voice recognition software. Voice recognition is usually quite accurate but there are transcription errors that can and very often do occur. I apologize for any typographical errors that were not detected and corrected.     Finley Chevez, MD 08/08/2023, 1:21 PM

## 2023-08-23 ENCOUNTER — Other Ambulatory Visit: Payer: Self-pay

## 2023-08-23 DIAGNOSIS — E66811 Obesity, class 1: Secondary | ICD-10-CM

## 2023-08-23 MED ORDER — MOUNJARO 7.5 MG/0.5ML ~~LOC~~ SOAJ: 7.5000 mg | SUBCUTANEOUS | 1 refills | Status: DC

## 2023-08-29 ENCOUNTER — Ambulatory Visit: Admitting: Psychiatry

## 2023-09-18 ENCOUNTER — Other Ambulatory Visit: Payer: Self-pay | Admitting: Internal Medicine

## 2023-09-18 DIAGNOSIS — E039 Hypothyroidism, unspecified: Secondary | ICD-10-CM

## 2023-10-10 ENCOUNTER — Encounter: Payer: Self-pay | Admitting: Psychiatry

## 2023-10-10 ENCOUNTER — Ambulatory Visit: Admitting: Psychiatry

## 2023-10-10 VITALS — BP 122/80 | HR 92 | Temp 98.7°F | Ht 63.0 in | Wt 181.2 lb

## 2023-10-10 DIAGNOSIS — Z789 Other specified health status: Secondary | ICD-10-CM

## 2023-10-10 DIAGNOSIS — F330 Major depressive disorder, recurrent, mild: Secondary | ICD-10-CM

## 2023-10-10 DIAGNOSIS — F411 Generalized anxiety disorder: Secondary | ICD-10-CM | POA: Diagnosis not present

## 2023-10-10 MED ORDER — BUPROPION HCL ER (XL) 300 MG PO TB24: 300.0000 mg | ORAL_TABLET | Freq: Every morning | ORAL | 0 refills | Status: DC
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
## 2023-10-13 ENCOUNTER — Other Ambulatory Visit: Payer: Self-pay | Admitting: Internal Medicine

## 2023-10-13 DIAGNOSIS — I10 Essential (primary) hypertension: Secondary | ICD-10-CM

## 2023-10-27 ENCOUNTER — Other Ambulatory Visit: Payer: Self-pay | Admitting: Internal Medicine

## 2023-10-27 DIAGNOSIS — E66811 Obesity, class 1: Secondary | ICD-10-CM

## 2023-10-29 ENCOUNTER — Other Ambulatory Visit: Payer: Self-pay

## 2023-10-29 DIAGNOSIS — E6609 Other obesity due to excess calories: Secondary | ICD-10-CM

## 2023-10-30 MED ORDER — MOUNJARO 7.5 MG/0.5ML ~~LOC~~ SOAJ: 7.5000 mg | SUBCUTANEOUS | 1 refills | Status: DC

## 2023-11-06 ENCOUNTER — Ambulatory Visit (INDEPENDENT_AMBULATORY_CARE_PROVIDER_SITE_OTHER): Admitting: Internal Medicine

## 2023-11-06 ENCOUNTER — Encounter: Payer: Self-pay | Admitting: Internal Medicine

## 2023-11-06 VITALS — BP 118/76 | HR 84 | Ht 63.0 in | Wt 177.8 lb

## 2023-11-06 DIAGNOSIS — E039 Hypothyroidism, unspecified: Secondary | ICD-10-CM

## 2023-11-06 DIAGNOSIS — G4733 Obstructive sleep apnea (adult) (pediatric): Secondary | ICD-10-CM | POA: Diagnosis not present

## 2023-11-06 DIAGNOSIS — R7303 Prediabetes: Secondary | ICD-10-CM

## 2023-11-06 DIAGNOSIS — M7918 Myalgia, other site: Secondary | ICD-10-CM | POA: Diagnosis not present

## 2023-11-06 DIAGNOSIS — E782 Mixed hyperlipidemia: Secondary | ICD-10-CM

## 2023-11-06 DIAGNOSIS — F411 Generalized anxiety disorder: Secondary | ICD-10-CM | POA: Diagnosis not present

## 2023-11-06 DIAGNOSIS — I10 Essential (primary) hypertension: Secondary | ICD-10-CM | POA: Diagnosis not present

## 2023-11-06 MED ORDER — MOUNJARO 10 MG/0.5ML ~~LOC~~ SOAJ
10.0000 mg | SUBCUTANEOUS | 0 refills | Status: DC
Start: 2023-11-06 — End: 2023-12-24

## 2023-11-06 NOTE — Progress Notes (Signed)
 Established Patient Office Visit  Subjective:  Patient ID: Shannon Crawford, female    DOB: 1955/05/03  Age: 68 y.o. MRN: [id number]  Chief Complaint  Patient presents with   Follow-up    Patient comes in for a follow up today. Missed previous appointment. Generally feels well, however muscular aches and pains especially after a workout. She is doing circuit training, which is mild- moderate intensity, so noticing it more. Advised to drink more water. Will return fasting for labs. Mammogram is uptodate. On Mounjaro  7.5 mg /week, thinks its not helping much any more. Will increase to 10 mg/week. Denies nausea, vomiting or abdominal pain.    No other concerns at this time.   Past Medical History:  Diagnosis Date   Anxiety    Fracture    ankle   Heart murmur    Hypothyroid    Impaired glucose tolerance    Mixed hyperlipidemia    OSA on CPAP    Primary vitiligo    Thyroid  disease     Past Surgical History:  Procedure Laterality Date   ABDOMINAL SURGERY     tummy tuck   BREAST BIOPSY Left 10+ years   Negative- core bx    Social History   Socioeconomic History   Marital status: Married    Spouse name: John   Number of children: 2   Years of education: Not on file   Highest education level: Some college, no degree  Occupational History   Not on file  Tobacco Use   Smoking status: Never   Smokeless tobacco: Never  Vaping Use   Vaping status: Never Used  Substance and Sexual Activity   Alcohol use: Yes    Alcohol/week: 12.0 standard drinks of alcohol    Types: 12 Cans of beer per week   Drug use: No   Sexual activity: Yes  Other Topics Concern   Not on file  Social History Narrative   Not on file   Social Drivers of Health   Financial Resource Strain: Not on file  Food Insecurity: No Food Insecurity (01/11/2023)   Hunger Vital Sign    Worried About Running Out of Food in the Last Year: Never true    Ran Out of Food in the Last Year: Never true   Transportation Needs: No Transportation Needs (01/11/2023)   PRAPARE - Administrator, Civil Service (Medical): No    Lack of Transportation (Non-Medical): No  Physical Activity: Not on file  Stress: Not on file  Social Connections: Not on file  Intimate Partner Violence: Not At Risk (01/11/2023)   Humiliation, Afraid, Rape, and Kick questionnaire    Fear of Current or Ex-Partner: No    Emotionally Abused: No    Physically Abused: No    Sexually Abused: No    Family History  Problem Relation Age of Onset   Hypertension Mother    Alcohol abuse Father    Anxiety disorder Sister    Hypertension Sister    Breast cancer Sister 88   Prostate cancer Brother    Breast cancer Cousin    ADD / ADHD Daughter     Allergies  Allergen Reactions   Catfish [Fish Allergy] Nausea And Vomiting    Outpatient Medications Prior to Visit  Medication Sig   amLODipine  (NORVASC ) 5 MG tablet TAKE 1 TABLET BY MOUTH DAILY   B Complex-C (SUPER B COMPLEX PO) Take by mouth.   buPROPion  (WELLBUTRIN  XL) 300 MG 24 hr tablet Take 1  tablet (300 mg total) by mouth in the morning. Dose change , stop the 150 mg   Cholecalciferol (D-3-5) 125 MCG (5000 UT) capsule Take 5,000 Units by mouth daily.   DULoxetine  (CYMBALTA ) 60 MG capsule Take 1 capsule (60 mg total) by mouth daily.   levothyroxine  (SYNTHROID ) 112 MCG tablet TAKE 1 TABLET BY MOUTH DAILY   losartan-hydrochlorothiazide (HYZAAR) 100-25 MG tablet TAKE 1 TABLET BY MOUTH DAILY   rosuvastatin (CRESTOR) 20 MG tablet TAKE 1 TABLET BY MOUTH DAILY   zolpidem  (AMBIEN ) 5 MG tablet Take 1 tablet (5 mg total) by mouth at bedtime as needed. for sleep   [DISCONTINUED] tirzepatide  (MOUNJARO ) 7.5 MG/0.5ML Pen Inject 7.5 mg into the skin once a week.   No facility-administered medications prior to visit.    Review of Systems  Constitutional: Negative.  Negative for chills, fever and malaise/fatigue.  HENT: Negative.  Negative for congestion and sore  throat.   Eyes: Negative.  Negative for blurred vision and pain.  Respiratory: Negative.  Negative for cough and shortness of breath.   Cardiovascular: Negative.  Negative for chest pain, palpitations and leg swelling.  Gastrointestinal: Negative.  Negative for abdominal pain, blood in stool, constipation, diarrhea, heartburn, melena, nausea and vomiting.  Genitourinary: Negative.  Negative for dysuria, flank pain, frequency and urgency.  Musculoskeletal: Negative.  Negative for joint pain and myalgias.  Skin: Negative.   Neurological: Negative.  Negative for dizziness, tingling, sensory change, weakness and headaches.  Endo/Heme/Allergies: Negative.   Psychiatric/Behavioral: Negative.  Negative for depression and suicidal ideas. The patient is not nervous/anxious.        Objective:   BP 118/76   Pulse 84   Ht 5' 3 (1.6 m)   Wt 177 lb 12.8 oz (80.6 kg)   SpO2 96%   BMI 31.50 kg/m   Vitals:   11/06/23 1328  BP: 118/76  Pulse: 84  Height: 5' 3 (1.6 m)  Weight: 177 lb 12.8 oz (80.6 kg)  SpO2: 96%  BMI (Calculated): 31.5    Physical Exam Vitals and nursing note reviewed.  Constitutional:      Appearance: Normal appearance.  HENT:     Head: Normocephalic and atraumatic.     Nose: Nose normal.     Mouth/Throat:     Mouth: Mucous membranes are moist.     Pharynx: Oropharynx is clear.  Eyes:     Conjunctiva/sclera: Conjunctivae normal.     Pupils: Pupils are equal, round, and reactive to light.  Cardiovascular:     Rate and Rhythm: Normal rate and regular rhythm.     Pulses: Normal pulses.     Heart sounds: Normal heart sounds. No murmur heard. Pulmonary:     Effort: Pulmonary effort is normal.     Breath sounds: Normal breath sounds. No wheezing.  Abdominal:     General: Bowel sounds are normal.     Palpations: Abdomen is soft.     Tenderness: There is no abdominal tenderness. There is no right CVA tenderness or left CVA tenderness.  Musculoskeletal:         General: Normal range of motion.     Cervical back: Normal range of motion.     Right lower leg: No edema.     Left lower leg: No edema.  Skin:    General: Skin is warm and dry.  Neurological:     General: No focal deficit present.     Mental Status: She is alert and oriented to person, place, and time.  Psychiatric:        Mood and Affect: Mood normal.        Behavior: Behavior normal.      No results found for any visits on 11/06/23.  No results found for this or any previous visit (from the past 2160 hours).    Assessment & Plan:  Continue current meds. Increase dose of Mounjaro . Check labs. Adequate hydration. Problem List Items Addressed This Visit     GAD (generalized anxiety disorder)   Hypothyroidism   Essential hypertension, benign - Primary   Relevant Orders   CMP14+EGFR   CBC with Diff   Mixed hyperlipidemia   Relevant Orders   Lipid Panel w/o Chol/HDL Ratio   OSA on CPAP   Prediabetes   Relevant Medications   tirzepatide  (MOUNJARO ) 10 MG/0.5ML Pen   Other Relevant Orders   Hemoglobin A1c   Other Visit Diagnoses       Myalgia, multiple sites       Relevant Orders   CK, total   Sed Rate (ESR)       Return in about 2 months (around 01/06/2024).   Total time spent: 30 minutes  FERNAND FREDY RAMAN, MD  11/06/2023   This document may have been prepared by [facility] Voice Recognition software and as such may include unintentional dictation errors.

## 2023-11-12 ENCOUNTER — Other Ambulatory Visit: Payer: Self-pay | Admitting: Psychiatry

## 2023-11-12 DIAGNOSIS — F411 Generalized anxiety disorder: Secondary | ICD-10-CM

## 2023-11-15 ENCOUNTER — Other Ambulatory Visit

## 2023-11-15 DIAGNOSIS — E782 Mixed hyperlipidemia: Secondary | ICD-10-CM | POA: Diagnosis not present

## 2023-11-15 DIAGNOSIS — I10 Essential (primary) hypertension: Secondary | ICD-10-CM | POA: Diagnosis not present

## 2023-11-15 DIAGNOSIS — M7918 Myalgia, other site: Secondary | ICD-10-CM | POA: Diagnosis not present

## 2023-11-15 DIAGNOSIS — R7303 Prediabetes: Secondary | ICD-10-CM | POA: Diagnosis not present

## 2023-11-16 LAB — CMP14+EGFR
ALT: 22 IU/L (ref 0–32)
AST: 18 IU/L (ref 0–40)
Albumin: 4.7 g/dL (ref 3.9–4.9)
Alkaline Phosphatase: 89 IU/L (ref 49–135)
BUN/Creatinine Ratio: 14 (ref 12–28)
BUN: 14 mg/dL (ref 8–27)
Bilirubin Total: 0.4 mg/dL (ref 0.0–1.2)
CO2: 25 mmol/L (ref 20–29)
Calcium: 10.1 mg/dL (ref 8.7–10.3)
Chloride: 103 mmol/L (ref 96–106)
Creatinine, Ser: 0.97 mg/dL (ref 0.57–1.00)
Globulin, Total: 2.3 g/dL (ref 1.5–4.5)
Glucose: 97 mg/dL (ref 70–99)
Potassium: 4.1 mmol/L (ref 3.5–5.2)
Sodium: 142 mmol/L (ref 134–144)
Total Protein: 7 g/dL (ref 6.0–8.5)
eGFR: 63 mL/min/1.73 (ref >59)

## 2023-11-16 LAB — LIPID PANEL W/O CHOL/HDL RATIO
Cholesterol, Total: 169 mg/dL (ref 100–199)
HDL: 63 mg/dL (ref >39)
LDL Chol Calc (NIH): 88 mg/dL (ref 0–99)
Triglycerides: 100 mg/dL (ref 0–149)
VLDL Cholesterol Cal: 18 mg/dL (ref 5–40)

## 2023-11-16 LAB — CBC WITH DIFFERENTIAL/PLATELET
Basophils Absolute: 0.1 x10E3/uL (ref 0.0–0.2)
Basos: 1 %
EOS (ABSOLUTE): 0.1 x10E3/uL (ref 0.0–0.4)
Eos: 2 %
Hematocrit: 41.2 % (ref 34.0–46.6)
Hemoglobin: 13.3 g/dL (ref 11.1–15.9)
Immature Grans (Abs): 0 x10E3/uL (ref 0.0–0.1)
Immature Granulocytes: 0 %
Lymphocytes Absolute: 1.1 x10E3/uL (ref 0.7–3.1)
Lymphs: 26 %
MCH: 29.9 pg (ref 26.6–33.0)
MCHC: 32.3 g/dL (ref 31.5–35.7)
MCV: 93 fL (ref 79–97)
Monocytes Absolute: 0.3 x10E3/uL (ref 0.1–0.9)
Monocytes: 7 %
Neutrophils Absolute: 2.6 x10E3/uL (ref 1.4–7.0)
Neutrophils: 64 %
Platelets: 228 x10E3/uL (ref 150–450)
RBC: 4.45 x10E6/uL (ref 3.77–5.28)
RDW: 13.6 % (ref 11.7–15.4)
WBC: 4.1 x10E3/uL (ref 3.4–10.8)

## 2023-11-16 LAB — HEMOGLOBIN A1C
Est. average glucose Bld gHb Est-mCnc: 117 mg/dL
Hgb A1c MFr Bld: 5.7 % — ABNORMAL HIGH (ref 4.8–5.6)

## 2023-11-16 LAB — CK: Total CK: 94 U/L (ref 32–182)

## 2023-11-16 LAB — SEDIMENTATION RATE: Sed Rate: 9 mm/h (ref 0–40)

## 2023-11-19 ENCOUNTER — Ambulatory Visit: Payer: Self-pay | Admitting: Internal Medicine

## 2023-11-19 NOTE — Progress Notes (Signed)
 Patient notified

## 2023-12-19 ENCOUNTER — Other Ambulatory Visit: Payer: Self-pay

## 2023-12-19 DIAGNOSIS — E039 Hypothyroidism, unspecified: Secondary | ICD-10-CM

## 2023-12-19 MED ORDER — LEVOTHYROXINE SODIUM 112 MCG PO TABS: 112.0000 ug | ORAL_TABLET | Freq: Every day | ORAL | 0 refills | Status: DC

## 2023-12-19 NOTE — Telephone Encounter (Signed)
 Pharmacy faxed requesting refill please advise

## 2023-12-24 ENCOUNTER — Other Ambulatory Visit: Payer: Self-pay | Admitting: Internal Medicine

## 2023-12-24 ENCOUNTER — Telehealth: Payer: Self-pay | Admitting: Psychiatry

## 2023-12-24 ENCOUNTER — Other Ambulatory Visit: Payer: Self-pay | Admitting: Psychiatry

## 2023-12-24 DIAGNOSIS — F411 Generalized anxiety disorder: Secondary | ICD-10-CM

## 2023-12-24 DIAGNOSIS — R7303 Prediabetes: Secondary | ICD-10-CM

## 2023-12-24 NOTE — Telephone Encounter (Signed)
 I do not see an appointment scheduled for follow-up, will have staff contact patient.  Last visit was in August.

## 2024-01-04 ENCOUNTER — Other Ambulatory Visit: Payer: Self-pay | Admitting: Psychiatry

## 2024-01-04 DIAGNOSIS — F330 Major depressive disorder, recurrent, mild: Secondary | ICD-10-CM

## 2024-01-07 ENCOUNTER — Ambulatory Visit (INDEPENDENT_AMBULATORY_CARE_PROVIDER_SITE_OTHER): Admitting: Internal Medicine

## 2024-01-07 ENCOUNTER — Encounter: Payer: Self-pay | Admitting: Internal Medicine

## 2024-01-07 VITALS — BP 120/70 | HR 73 | Ht 63.0 in | Wt 174.8 lb

## 2024-01-07 DIAGNOSIS — I10 Essential (primary) hypertension: Secondary | ICD-10-CM

## 2024-01-07 DIAGNOSIS — E66811 Obesity, class 1: Secondary | ICD-10-CM | POA: Diagnosis not present

## 2024-01-07 DIAGNOSIS — E039 Hypothyroidism, unspecified: Secondary | ICD-10-CM | POA: Diagnosis not present

## 2024-01-07 DIAGNOSIS — R7303 Prediabetes: Secondary | ICD-10-CM

## 2024-01-07 DIAGNOSIS — G4733 Obstructive sleep apnea (adult) (pediatric): Secondary | ICD-10-CM | POA: Diagnosis not present

## 2024-01-07 DIAGNOSIS — F3341 Major depressive disorder, recurrent, in partial remission: Secondary | ICD-10-CM | POA: Diagnosis not present

## 2024-01-07 DIAGNOSIS — E669 Obesity, unspecified: Secondary | ICD-10-CM | POA: Insufficient documentation

## 2024-01-07 DIAGNOSIS — E6609 Other obesity due to excess calories: Secondary | ICD-10-CM

## 2024-01-07 DIAGNOSIS — Z6830 Body mass index (BMI) 30.0-30.9, adult: Secondary | ICD-10-CM

## 2024-01-07 DIAGNOSIS — E782 Mixed hyperlipidemia: Secondary | ICD-10-CM

## 2024-01-07 DIAGNOSIS — F411 Generalized anxiety disorder: Secondary | ICD-10-CM | POA: Diagnosis not present

## 2024-01-07 NOTE — Progress Notes (Signed)
 Established Patient Office Visit  Subjective:  Patient ID: Shannon Crawford, female    DOB: 09-29-1955  Age: 68 y.o. MRN: [id number]  Chief Complaint  Patient presents with   Follow-up    2 month follow up    Patient is here for follow up. She reports she is doing well today. She reports has not started Mounjaro  increase as she had Mounjaro  7.5 mg remaining. She reports she has 1 injection left and will start the higher dose. Labs were discussed in detail today. She reports eating a healthy diet and exercising multiple times a week.  She reports increasing short term forgetfulness of what she went into rooms to do. Denies forgetting words, getting lost while driving, or forgetting to turn off stove, etc. She is due for AWV so will have patient make lists or keep memory journal in the mean time and will check mini mental when she returns. She reports issues getting to sleep. She reports needing to take her Ambien  up to 3 times a week which has increased from a few times a month.  She reports depression and anxiety has improved since Psychiatry increased her medications but reports she has noticed the forgetfulness occurring more often since her medication increases. Recommend her to discuss this with her Psychologist prior to making any additional changes.  She reports she has had covid and flu vaccines completed in the last month.    No other concerns at this time.   Past Medical History:  Diagnosis Date   Anxiety    Fracture    ankle   Heart murmur    Hypothyroid    Impaired glucose tolerance    Mixed hyperlipidemia    OSA on CPAP    Primary vitiligo    Thyroid  disease     Past Surgical History:  Procedure Laterality Date   ABDOMINAL SURGERY     tummy tuck   BREAST BIOPSY Left 10+ years   Negative- core bx    Social History   Socioeconomic History   Marital status: Married    Spouse name: John   Number of children: 2   Years of education: Not on file   Highest  education level: Some college, no degree  Occupational History   Not on file  Tobacco Use   Smoking status: Never   Smokeless tobacco: Never  Vaping Use   Vaping status: Never Used  Substance and Sexual Activity   Alcohol use: Yes    Alcohol/week: 12.0 standard drinks of alcohol    Types: 12 Cans of beer per week   Drug use: No   Sexual activity: Yes  Other Topics Concern   Not on file  Social History Narrative   Not on file   Social Drivers of Health   Financial Resource Strain: Not on file  Food Insecurity: No Food Insecurity (01/11/2023)   Hunger Vital Sign    Worried About Running Out of Food in the Last Year: Never true    Ran Out of Food in the Last Year: Never true  Transportation Needs: No Transportation Needs (01/11/2023)   PRAPARE - Administrator, Civil Service (Medical): No    Lack of Transportation (Non-Medical): No  Physical Activity: Not on file  Stress: Not on file  Social Connections: Not on file  Intimate Partner Violence: Not At Risk (01/11/2023)   Humiliation, Afraid, Rape, and Kick questionnaire    Fear of Current or Ex-Partner: No    Emotionally Abused: No  Physically Abused: No    Sexually Abused: No    Family History  Problem Relation Age of Onset   Hypertension Mother    Alcohol abuse Father    Anxiety disorder Sister    Hypertension Sister    Breast cancer Sister 64   Prostate cancer Brother    Breast cancer Cousin    ADD / ADHD Daughter     Allergies  Allergen Reactions   Catfish [Fish Allergy] Nausea And Vomiting    Outpatient Medications Prior to Visit  Medication Sig   amLODipine  (NORVASC ) 5 MG tablet TAKE 1 TABLET BY MOUTH DAILY   buPROPion  (WELLBUTRIN  XL) 300 MG 24 hr tablet TAKE 1 TABLET BY MOUTH EVERY MORNING *STOP THE 150 MG*   DULoxetine  (CYMBALTA ) 60 MG capsule TAKE 1 CAPSULE BY MOUTH DAILY   levothyroxine  (SYNTHROID ) 112 MCG tablet Take 1 tablet (112 mcg total) by mouth daily.    losartan-hydrochlorothiazide (HYZAAR) 100-25 MG tablet TAKE 1 TABLET BY MOUTH DAILY   rosuvastatin (CRESTOR) 20 MG tablet TAKE 1 TABLET BY MOUTH DAILY   tirzepatide  (MOUNJARO ) 7.5 MG/0.5ML Pen Inject 7.5 mg into the skin once a week.   zolpidem  (AMBIEN ) 5 MG tablet TAKE 1 TABLET BY MOUTH AT BEDTIME AS NEEDED FOR SLEEP   B Complex-C (SUPER B COMPLEX PO) Take by mouth. (Patient not taking: Reported on 01/07/2024)   Cholecalciferol (D-3-5) 125 MCG (5000 UT) capsule Take 5,000 Units by mouth daily. (Patient not taking: Reported on 01/07/2024)   MOUNJARO  10 MG/0.5ML Pen INJECT 10 MG UNDER THE SKIN ONCE WEEKLY (Patient not taking: Reported on 01/07/2024)   No facility-administered medications prior to visit.    Review of Systems  Constitutional: Negative.  Negative for chills, fever and malaise/fatigue.  HENT: Negative.  Negative for congestion and sore throat.   Eyes: Negative.  Negative for blurred vision and pain.  Respiratory: Negative.  Negative for cough and shortness of breath.   Cardiovascular: Negative.  Negative for chest pain, palpitations and leg swelling.  Gastrointestinal: Negative.  Negative for abdominal pain, blood in stool, constipation, diarrhea, heartburn, melena, nausea and vomiting.  Genitourinary: Negative.  Negative for dysuria, flank pain, frequency and urgency.  Musculoskeletal: Negative.  Negative for joint pain and myalgias.  Skin: Negative.   Neurological: Negative.  Negative for dizziness, tingling, sensory change, weakness and headaches.  Endo/Heme/Allergies: Negative.   Psychiatric/Behavioral:  Negative for depression and suicidal ideas. The patient has insomnia. The patient is not nervous/anxious.        Forgetfulness       Objective:   BP 120/70   Pulse 73   Ht 5' 3 (1.6 m)   Wt 174 lb 12.8 oz (79.3 kg)   SpO2 93%   BMI 30.96 kg/m   Vitals:   01/07/24 1257  BP: 120/70  Pulse: 73  Height: 5' 3 (1.6 m)  Weight: 174 lb 12.8 oz (79.3 kg)  SpO2:  93%  BMI (Calculated): 30.97    Physical Exam Vitals and nursing note reviewed.  Constitutional:      Appearance: Normal appearance.  HENT:     Head: Normocephalic and atraumatic.     Nose: Nose normal.     Mouth/Throat:     Mouth: Mucous membranes are moist.     Pharynx: Oropharynx is clear.  Eyes:     Conjunctiva/sclera: Conjunctivae normal.     Pupils: Pupils are equal, round, and reactive to light.  Cardiovascular:     Rate and Rhythm: Normal rate and regular  rhythm.     Pulses: Normal pulses.     Heart sounds: Normal heart sounds. No murmur heard. Pulmonary:     Effort: Pulmonary effort is normal.     Breath sounds: Normal breath sounds. No wheezing.  Abdominal:     General: Bowel sounds are normal.     Palpations: Abdomen is soft.     Tenderness: There is no abdominal tenderness. There is no right CVA tenderness or left CVA tenderness.  Musculoskeletal:        General: Normal range of motion.     Cervical back: Normal range of motion.     Right lower leg: No edema.     Left lower leg: No edema.  Skin:    General: Skin is warm and dry.  Neurological:     General: No focal deficit present.     Mental Status: She is alert and oriented to person, place, and time.  Psychiatric:        Mood and Affect: Mood normal.        Behavior: Behavior normal.      No results found for any visits on 01/07/24.  Recent Results (from the past 2160 hours)  CMP14+EGFR     Status: None   Collection Time: 11/15/23 11:23 AM  Result Value Ref Range   Glucose 97 70 - 99 mg/dL   BUN 14 8 - 27 mg/dL   Creatinine, Ser 9.02 0.57 - 1.00 mg/dL   eGFR 63 >40 fO/fpw/8.26   BUN/Creatinine Ratio 14 12 - 28   Sodium 142 134 - 144 mmol/L   Potassium 4.1 3.5 - 5.2 mmol/L   Chloride 103 96 - 106 mmol/L   CO2 25 20 - 29 mmol/L   Calcium 10.1 8.7 - 10.3 mg/dL   Total Protein 7.0 6.0 - 8.5 g/dL   Albumin 4.7 3.9 - 4.9 g/dL   Globulin, Total 2.3 1.5 - 4.5 g/dL   Bilirubin Total 0.4 0.0 - 1.2  mg/dL   Alkaline Phosphatase 89 49 - 135 IU/L    Comment:               **Please note reference interval change**   AST 18 0 - 40 IU/L   ALT 22 0 - 32 IU/L  Lipid Panel w/o Chol/HDL Ratio     Status: None   Collection Time: 11/15/23 11:23 AM  Result Value Ref Range   Cholesterol, Total 169 100 - 199 mg/dL   Triglycerides 899 0 - 149 mg/dL   HDL 63 >60 mg/dL   VLDL Cholesterol Cal 18 5 - 40 mg/dL   LDL Chol Calc (NIH) 88 0 - 99 mg/dL  CK, total     Status: None   Collection Time: 11/15/23 11:23 AM  Result Value Ref Range   Total CK 94 32 - 182 U/L  Hemoglobin A1c     Status: Abnormal   Collection Time: 11/15/23 11:23 AM  Result Value Ref Range   Hgb A1c MFr Bld 5.7 (H) 4.8 - 5.6 %    Comment:          Prediabetes: 5.7 - 6.4          Diabetes: >6.4          Glycemic control for adults with diabetes: <7.0    Est. average glucose Bld gHb Est-mCnc 117 mg/dL  CBC with Diff     Status: None   Collection Time: 11/15/23 11:23 AM  Result Value Ref Range   WBC 4.1  3.4 - 10.8 x10E3/uL   RBC 4.45 3.77 - 5.28 x10E6/uL   Hemoglobin 13.3 11.1 - 15.9 g/dL   Hematocrit 58.7 65.9 - 46.6 %   MCV 93 79 - 97 fL   MCH 29.9 26.6 - 33.0 pg   MCHC 32.3 31.5 - 35.7 g/dL   RDW 86.3 88.2 - 84.5 %   Platelets 228 150 - 450 x10E3/uL   Neutrophils 64 Not Estab. %   Lymphs 26 Not Estab. %   Monocytes 7 Not Estab. %   Eos 2 Not Estab. %   Basos 1 Not Estab. %   Neutrophils Absolute 2.6 1.4 - 7.0 x10E3/uL   Lymphocytes Absolute 1.1 0.7 - 3.1 x10E3/uL   Monocytes Absolute 0.3 0.1 - 0.9 x10E3/uL   EOS (ABSOLUTE) 0.1 0.0 - 0.4 x10E3/uL   Basophils Absolute 0.1 0.0 - 0.2 x10E3/uL   Immature Granulocytes 0 Not Estab. %   Immature Grans (Abs) 0.0 0.0 - 0.1 x10E3/uL  Sed Rate (ESR)     Status: None   Collection Time: 11/15/23 11:23 AM  Result Value Ref Range   Sed Rate 9 0 - 40 mm/hr      Assessment & Plan:  Continue taking medications as prescribed. FU with Psychiatry about insomnia and  forgetfulness. Continue wearing CPAP nightly. Check routine labs when she returns for her AWV. Problem List Items Addressed This Visit     GAD (generalized anxiety disorder)   MDD (major depressive disorder), recurrent, in partial remission   Hypothyroidism   Essential hypertension, benign - Primary   Mixed hyperlipidemia   OSA on CPAP   Prediabetes   Obesity (BMI 30-39.9)   Relevant Medications   tirzepatide  (MOUNJARO ) 7.5 MG/0.5ML Pen    Return in about 6 weeks (around 02/18/2024) for AWV before end of year.   Total time spent: 25 minutes. This time includes review of previous notes and results and patient face to face interaction during today's visit.    FERNAND FREDY RAMAN, MD  01/07/2024   This document may have been prepared by [facility] Voice Recognition software and as such may include unintentional dictation errors.

## 2024-01-14 ENCOUNTER — Other Ambulatory Visit: Payer: Self-pay | Admitting: Internal Medicine

## 2024-01-14 DIAGNOSIS — R7303 Prediabetes: Secondary | ICD-10-CM

## 2024-01-28 ENCOUNTER — Ambulatory Visit: Admitting: Psychiatry

## 2024-01-28 ENCOUNTER — Other Ambulatory Visit: Payer: Self-pay

## 2024-01-28 ENCOUNTER — Encounter: Payer: Self-pay | Admitting: Psychiatry

## 2024-01-28 VITALS — BP 120/82 | HR 89 | Temp 97.1°F | Ht 63.0 in | Wt 173.0 lb

## 2024-01-28 DIAGNOSIS — Z789 Other specified health status: Secondary | ICD-10-CM

## 2024-01-28 DIAGNOSIS — F3342 Major depressive disorder, recurrent, in full remission: Secondary | ICD-10-CM

## 2024-01-28 DIAGNOSIS — F411 Generalized anxiety disorder: Secondary | ICD-10-CM

## 2024-01-28 NOTE — Progress Notes (Signed)
 [facility] MD OP Progress Note  01/28/2024 4:57 PM Shannon Crawford  MRN:  [id number]  Chief Complaint:  Chief Complaint  Patient presents with   Follow-up   Depression   Anxiety   Medication Refill   Discussed the use of AI scribe software for clinical note transcription with the patient, who gave verbal consent to proceed.  History of Present Illness Shannon Crawford is a 68 year old biracial female, married, lives in [place], has a history of GAD, MDD, chronic pain syndrome, vitiligo, hypertension, hypothyroidism, hyperlipidemia was evaluated in office today for a follow-up appointment.  She reports improvement in mood and anxiety since her medication changed, describing that initial irritability has resolved. She has noticed increased motivation and organization, especially with quilting activities and maintaining her room. Quilting remains both rewarding and anxiety-provoking for her, particularly when she prepares for upcoming quilt shows and manages complex projects. She describes feeling anxious about her skill level and the pressure of completing challenging quilts but states she manages these tasks and receives support from other quilters.  She denies crying spells and irritability. She continues to have ongoing difficulty with sleep initiation, as she often lies awake for a long time before falling asleep, typically going to bed around 11:00-11:30 PM and falling asleep by 1:00 AM. Her evening routine includes watching TV and reading before bed, and she estimates she sleeps until 7:30-8:30 AM, with occasional brief awakenings during the night. Drinking something warm helps her return to sleep when she wakes up at night.  She reports appetite is fair.  She has lost weight since her last visit since she is currently on Mounjaro  for prediabetes.  She also has been actively exercising and that helps as well.  Her current medications include Wellbutrin  300 mg, Cymbalta  60 mg, and Ambien . She reports  feeling much better since her medication was changed.    She denies any thoughts of self-harm or hurting herself, stating she has been doing well since her medication changed.  She reports current alcohol use, typically drinking beer approximately 4 times per week, with at least 2 beers per occasion, usually at night.     Visit Diagnosis:    ICD-10-CM   1. GAD (generalized anxiety disorder)  F41.1     2. Recurrent major depressive disorder, in full remission  F33.42     3. Significant use of alcohol  Z78.9       Past Psychiatric History: I have reviewed past psychiatric history from progress note on 03/29/2021.  Past trials of medications like Lexapro , Zoloft, duloxetine , Rexulti , Ambien , Xanax , Topamax   Past Medical History:  Past Medical History:  Diagnosis Date   Anxiety    Fracture    ankle   Heart murmur    Hypothyroid    Impaired glucose tolerance    Mixed hyperlipidemia    OSA on CPAP    Primary vitiligo    Thyroid  disease     Past Surgical History:  Procedure Laterality Date   ABDOMINAL SURGERY     tummy tuck   BREAST BIOPSY Left 10+ years   Negative- core bx    Family Psychiatric History: I have reviewed family psychiatric history from progress note on 03/29/2021.  Family History:  Family History  Problem Relation Age of Onset   Hypertension Mother    Alcohol abuse Father    Anxiety disorder Sister    Hypertension Sister    Breast cancer Sister 52   Prostate cancer Brother    Breast cancer Cousin  ADD / ADHD Daughter     Social History: I have reviewed social history from progress note on 03/29/2021. Social History   Socioeconomic History   Marital status: Married    Spouse name: John   Number of children: 2   Years of education: Not on file   Highest education level: Some college, no degree  Occupational History   Not on file  Tobacco Use   Smoking status: Never   Smokeless tobacco: Never  Vaping Use   Vaping status: Never Used   Substance and Sexual Activity   Alcohol use: Yes    Alcohol/week: 12.0 standard drinks of alcohol    Types: 12 Cans of beer per week   Drug use: No   Sexual activity: Yes  Other Topics Concern   Not on file  Social History Narrative   Not on file   Social Drivers of Health   Financial Resource Strain: Not on file  Food Insecurity: No Food Insecurity (01/11/2023)   Hunger Vital Sign    Worried About Running Out of Food in the Last Year: Never true    Ran Out of Food in the Last Year: Never true  Transportation Needs: No Transportation Needs (01/11/2023)   PRAPARE - Administrator, Civil Service (Medical): No    Lack of Transportation (Non-Medical): No  Physical Activity: Not on file  Stress: Not on file  Social Connections: Not on file    Allergies:  Allergies  Allergen Reactions   Catfish [Fish Allergy] Nausea And Vomiting    Metabolic Disorder Labs: Lab Results  Component Value Date   HGBA1C 5.7 (H) 11/15/2023   No results found for: PROLACTIN Lab Results  Component Value Date   CHOL 169 11/15/2023   TRIG 100 11/15/2023   HDL 63 11/15/2023   LDLCALC 88 11/15/2023   LDLCALC 99 04/13/2023   Lab Results  Component Value Date   TSH 2.060 04/13/2023   TSH 0.929 01/04/2023    Therapeutic Level Labs: No results found for: LITHIUM No results found for: VALPROATE No results found for: CBMZ  Current Medications: Current Outpatient Medications  Medication Sig Dispense Refill   amLODipine  (NORVASC ) 5 MG tablet TAKE 1 TABLET BY MOUTH DAILY 90 tablet 3   B Complex-C (SUPER B COMPLEX PO) Take by mouth. (Patient not taking: Reported on 01/07/2024)     buPROPion  (WELLBUTRIN  XL) 300 MG 24 hr tablet TAKE 1 TABLET BY MOUTH EVERY MORNING *STOP THE 150 MG* 90 tablet 0   Cholecalciferol (D-3-5) 125 MCG (5000 UT) capsule Take 5,000 Units by mouth daily. (Patient not taking: Reported on 01/07/2024)     DULoxetine  (CYMBALTA ) 60 MG capsule TAKE 1 CAPSULE BY  MOUTH DAILY 90 capsule 1   levothyroxine  (SYNTHROID ) 112 MCG tablet Take 1 tablet (112 mcg total) by mouth daily. 90 tablet 0   losartan-hydrochlorothiazide (HYZAAR) 100-25 MG tablet TAKE 1 TABLET BY MOUTH DAILY 90 tablet 3   MOUNJARO  10 MG/0.5ML Pen INJECT 10 MG UNDER THE SKIN ONCE WEEKLY 2 mL 0   rosuvastatin (CRESTOR) 20 MG tablet TAKE 1 TABLET BY MOUTH DAILY 90 tablet 3   zolpidem  (AMBIEN ) 5 MG tablet TAKE 1 TABLET BY MOUTH AT BEDTIME AS NEEDED FOR SLEEP 30 tablet 5   No current facility-administered medications for this visit.     Musculoskeletal: Strength & Muscle Tone: within normal limits Gait & Station: normal Patient leans: N/A  Psychiatric Specialty Exam: Review of Systems  Psychiatric/Behavioral:  Positive for sleep disturbance.  Blood pressure 120/82, pulse 89, temperature (!) 97.1 F (36.2 C), temperature source Temporal, height 5' 3 (1.6 m), weight 173 lb (78.5 kg).Body mass index is 30.65 kg/m.  General Appearance: Casual  Eye Contact:  Fair  Speech:  Clear and Coherent  Volume:  Normal  Mood:  Euthymic  Affect:  Congruent  Thought Process:  Goal Directed and Descriptions of Associations: Intact  Orientation:  Full (Time, Place, and Person)  Thought Content: Logical   Suicidal Thoughts:  No  Homicidal Thoughts:  No  Memory:  Immediate;   Fair Recent;   Fair Remote;   Fair  Judgement:  Fair  Insight:  Fair  Psychomotor Activity:  Normal  Concentration:  Concentration: Fair and Attention Span: Fair  Recall:  [employer] of Knowledge: Fair  Language: Fair  Akathisia:  No  Handed:  Right  AIMS (if indicated): not done  Assets:  Manufacturing Systems Engineer Desire for Improvement Housing Social Support Transportation  ADL's:  Intact  Cognition: WNL  Sleep:  improving   Screenings: Geneticist, Molecular Office Visit from 09/29/2021 in [facility] Office Visit from 07/14/2021 in [facility]  AIMS Total Score 0 0   AUDIT    Flowsheet Row Counselor from 04/06/2022 in [facility] Visit from 03/29/2021 in [facility]  Alcohol Use Disorder Identification Test Final Score (AUDIT) 15 13   GAD-7    Flowsheet Row Office Visit from 10/10/2023 in [facility] Office Visit from 07/03/2023 in [facility] Office Visit from 05/10/2023 in [facility] Office Visit from 01/23/2023 in [facility] Office Visit from 01/11/2023 in [facility]  Total GAD-7 Score 2 10 1 1 2    PHQ2-9    Flowsheet Row Office Visit from 10/10/2023 in [facility] Office Visit from 07/03/2023 in [facility] Office Visit from 05/10/2023 in [facility] Office Visit from 01/23/2023 in [facility] Office Visit from 01/11/2023 in [facility]  PHQ-2 Total Score 2 2 2  0 1  PHQ-9 Total Score 7 8 7  -- 7   Flowsheet Row Office Visit from 01/28/2024 in [facility] Office Visit from 10/10/2023 in [facility] Office Visit from 08/08/2023 in [facility]  C-SSRS RISK CATEGORY No Risk No Risk No Risk     Assessment and Plan: NEVILLE PAULS is a 68 year old female, presented for a follow-up appointment, discussed assessment and plan as noted below.  1. GAD (generalized anxiety disorder)-stable Currently reports overall anxiety symptoms is well-managed. Continue Cymbalta  60 mg daily  2. Recurrent major depressive disorder, in full remission Denies any significant depression symptoms.  Overall improvement on the  Wellbutrin . Continue Wellbutrin  XL 300 mg daily Continue Zolpidem  5 mg at bedtime as needed. Reviewed [place] PMP AWARxE   3. Significant use of alcohol-improving Currently drinks at least 2 beers per night.  Encouraged to cut back. Provided counseling  Follow-up Follow-up in clinic in 3 months or sooner if needed.    Consent: Patient/Guardian gives verbal consent for treatment and assignment of benefits for services provided during this visit. Patient/Guardian expressed understanding and agreed to proceed.   This note was generated in part or whole with voice recognition  software. Voice recognition is usually quite accurate but there are transcription errors that can and very often do occur. I apologize for any typographical errors that were not detected and corrected.    Helaine Yackel, MD 01/28/2024, 4:57 PM

## 2024-02-07 ENCOUNTER — Other Ambulatory Visit: Payer: Self-pay | Admitting: Internal Medicine

## 2024-02-07 DIAGNOSIS — R7303 Prediabetes: Secondary | ICD-10-CM

## 2024-02-18 ENCOUNTER — Encounter: Payer: Self-pay | Admitting: Internal Medicine

## 2024-02-18 ENCOUNTER — Ambulatory Visit: Admitting: Internal Medicine

## 2024-02-18 VITALS — BP 118/72 | HR 87 | Ht 63.0 in | Wt 169.2 lb

## 2024-02-18 DIAGNOSIS — E039 Hypothyroidism, unspecified: Secondary | ICD-10-CM | POA: Diagnosis not present

## 2024-02-18 DIAGNOSIS — E782 Mixed hyperlipidemia: Secondary | ICD-10-CM

## 2024-02-18 DIAGNOSIS — I10 Essential (primary) hypertension: Secondary | ICD-10-CM

## 2024-02-18 DIAGNOSIS — Z0000 Encounter for general adult medical examination without abnormal findings: Secondary | ICD-10-CM

## 2024-02-18 DIAGNOSIS — Z0001 Encounter for general adult medical examination with abnormal findings: Secondary | ICD-10-CM | POA: Diagnosis not present

## 2024-02-18 DIAGNOSIS — Z1231 Encounter for screening mammogram for malignant neoplasm of breast: Secondary | ICD-10-CM

## 2024-02-18 DIAGNOSIS — R7303 Prediabetes: Secondary | ICD-10-CM | POA: Diagnosis not present

## 2024-02-18 DIAGNOSIS — F332 Major depressive disorder, recurrent severe without psychotic features: Secondary | ICD-10-CM | POA: Insufficient documentation

## 2024-02-18 DIAGNOSIS — G4733 Obstructive sleep apnea (adult) (pediatric): Secondary | ICD-10-CM

## 2024-02-18 NOTE — Progress Notes (Signed)
 "  Established Patient Office Visit  Subjective:  Patient ID: Shannon Crawford, female    DOB: Oct 03, 1955  Age: 67 y.o. MRN: [id number]  Chief Complaint  Patient presents with   Annual Exam    AWV    Patient comes in for her AWV today.  She is generally feeling well and has no new complaints.  She is up-to-date on her mammogram, DEXA scan, Cologuard. Her PHQ-9/GAD-7 score is 5/2.  She is under care of a psychiatrist and is taking medications regularly.  She admits that she drinks 1-2 beers every night, and has discussed that with her psychiatrist as well. Her CAT score is 0. Her next mammogram is in February 2026, will send in an order. Patient is due for blood work today.    No other concerns at this time.   Past Medical History:  Diagnosis Date   Anxiety    Fracture    ankle   Heart murmur    Hypothyroid    Impaired glucose tolerance    Mixed hyperlipidemia    OSA on CPAP    Primary vitiligo    Thyroid  disease     Past Surgical History:  Procedure Laterality Date   ABDOMINAL SURGERY     tummy tuck   BREAST BIOPSY Left 10+ years   Negative- core bx    Social History   Socioeconomic History   Marital status: Married    Spouse name: John   Number of children: 2   Years of education: Not on file   Highest education level: Some college, no degree  Occupational History   Not on file  Tobacco Use   Smoking status: Never   Smokeless tobacco: Never  Vaping Use   Vaping status: Never Used  Substance and Sexual Activity   Alcohol use: Yes    Alcohol/week: 12.0 standard drinks of alcohol    Types: 12 Cans of beer per week   Drug use: No   Sexual activity: Yes  Other Topics Concern   Not on file  Social History Narrative   Not on file   Social Drivers of Health   Tobacco Use: Low Risk (02/18/2024)   Patient History    Smoking Tobacco Use: Never    Smokeless Tobacco Use: Never    Passive Exposure: Not on file  Financial Resource Strain: Not on file  Food  Insecurity: No Food Insecurity (02/18/2024)   Epic    Worried About Programme Researcher, Broadcasting/film/video in the Last Year: Never true    Ran Out of Food in the Last Year: Never true  Transportation Needs: No Transportation Needs (02/18/2024)   Epic    Lack of Transportation (Medical): No    Lack of Transportation (Non-Medical): No  Physical Activity: Not on file  Stress: Not on file  Social Connections: Not on file  Intimate Partner Violence: Unknown (02/18/2024)   Epic    Fear of Current or Ex-Partner: No    Emotionally Abused: No    Physically Abused: Not on file    Sexually Abused: No  Depression (PHQ2-9): Medium Risk (02/18/2024)   Depression (PHQ2-9)    PHQ-2 Score: 5  Alcohol Screen: Low Risk (02/18/2024)   Alcohol Screen    Last Alcohol Screening Score (AUDIT): 4  Housing: Unknown (02/18/2024)   Epic    Unable to Pay for Housing in the Last Year: No    Number of Times Moved in the Last Year: Not on file    Homeless in the  Last Year: No  Utilities: Not At Risk (02/18/2024)   Epic    Threatened with loss of utilities: No  Health Literacy: Adequate Health Literacy (02/18/2024)   B1300 Health Literacy    Frequency of need for help with medical instructions: Never    Family History  Problem Relation Age of Onset   Hypertension Mother    Alcohol abuse Father    Anxiety disorder Sister    Hypertension Sister    Breast cancer Sister 93   Prostate cancer Brother    Breast cancer Cousin    ADD / ADHD Daughter     Allergies[1]  Show/hide medication list[2]  Review of Systems  Constitutional: Negative.  Negative for chills, fever and malaise/fatigue.  HENT: Negative.  Negative for congestion and sore throat.   Eyes: Negative.  Negative for blurred vision and pain.  Respiratory: Negative.  Negative for cough and shortness of breath.   Cardiovascular: Negative.  Negative for chest pain, palpitations and leg swelling.  Gastrointestinal: Negative.  Negative for abdominal pain, blood  in stool, constipation, diarrhea, heartburn, melena, nausea and vomiting.  Genitourinary: Negative.  Negative for dysuria, flank pain, frequency and urgency.  Musculoskeletal: Negative.  Negative for joint pain and myalgias.  Skin: Negative.   Neurological: Negative.  Negative for dizziness, tingling, sensory change, weakness and headaches.  Endo/Heme/Allergies: Negative.   Psychiatric/Behavioral: Negative.  Negative for depression and suicidal ideas. The patient is not nervous/anxious.        Objective:   BP 118/72   Pulse 87   Ht 5' 3 (1.6 m)   Wt 169 lb 3.2 oz (76.7 kg)   SpO2 96%   BMI 29.97 kg/m   Vitals:   02/18/24 1327  BP: 118/72  Pulse: 87  Height: 5' 3 (1.6 m)  Weight: 169 lb 3.2 oz (76.7 kg)  SpO2: 96%  BMI (Calculated): 29.98    Physical Exam Vitals and nursing note reviewed.  Constitutional:      Appearance: Normal appearance.  HENT:     Head: Normocephalic and atraumatic.     Nose: Nose normal.     Mouth/Throat:     Mouth: Mucous membranes are moist.     Pharynx: Oropharynx is clear.  Eyes:     Conjunctiva/sclera: Conjunctivae normal.     Pupils: Pupils are equal, round, and reactive to light.  Cardiovascular:     Rate and Rhythm: Normal rate and regular rhythm.     Pulses: Normal pulses.     Heart sounds: Normal heart sounds. No murmur heard. Pulmonary:     Effort: Pulmonary effort is normal.     Breath sounds: Normal breath sounds. No wheezing.  Abdominal:     General: Bowel sounds are normal.     Palpations: Abdomen is soft.     Tenderness: There is no abdominal tenderness. There is no right CVA tenderness or left CVA tenderness.  Musculoskeletal:        General: Normal range of motion.     Cervical back: Normal range of motion.     Right lower leg: No edema.     Left lower leg: No edema.  Skin:    General: Skin is warm and dry.  Neurological:     General: No focal deficit present.     Mental Status: She is alert and oriented to  person, place, and time.  Psychiatric:        Mood and Affect: Mood normal.        Behavior: Behavior  normal.      No results found for any visits on 02/18/24.  No results found for this or any previous visit (from the past 2160 hours).    Assessment & Plan:  Continue current medications.  Check labs today. Problem List Items Addressed This Visit       Cardiovascular and Mediastinum   Essential hypertension, benign   Relevant Orders   CBC with Diff   CMP14+EGFR     Respiratory   OSA on CPAP     Endocrine   Hypothyroidism   Relevant Orders   TSH     Other   Mixed hyperlipidemia   Relevant Orders   Lipid Panel w/o Chol/HDL Ratio   Prediabetes   Relevant Orders   Hemoglobin A1c   Severe episode of recurrent major depressive disorder, without psychotic features (HCC)   Other Visit Diagnoses       Medicare annual wellness visit, subsequent    -  Primary     Breast cancer screening by mammogram       Relevant Orders   MM 3D SCREENING MAMMOGRAM BILATERAL BREAST      Follow up 3 months.  Total time spent: 30 minutes. This time includes review of previous notes and results and patient face to face interaction during today's visit.    FERNAND FREDY RAMAN, MD  02/18/2024   This document may have been prepared by [facility] Voice Recognition software and as such may include unintentional dictation errors.      [1]  Allergies Allergen Reactions   Catfish [Fish Allergy] Nausea And Vomiting  [2]  Outpatient Medications Prior to Visit  Medication Sig   amLODipine  (NORVASC ) 5 MG tablet TAKE 1 TABLET BY MOUTH DAILY   B Complex-C (SUPER B COMPLEX PO) Take by mouth.   buPROPion  (WELLBUTRIN  XL) 300 MG 24 hr tablet TAKE 1 TABLET BY MOUTH EVERY MORNING *STOP THE 150 MG*   Cholecalciferol (D-3-5) 125 MCG (5000 UT) capsule Take 5,000 Units by mouth daily.   DULoxetine  (CYMBALTA ) 60 MG capsule TAKE 1 CAPSULE BY MOUTH DAILY   levothyroxine  (SYNTHROID ) 112 MCG tablet Take 1  tablet (112 mcg total) by mouth daily.   losartan-hydrochlorothiazide (HYZAAR) 100-25 MG tablet TAKE 1 TABLET BY MOUTH DAILY   MOUNJARO  10 MG/0.5ML Pen INJECT 10 MG UNDER THE SKIN ONCE WEEKLY   rosuvastatin (CRESTOR) 20 MG tablet TAKE 1 TABLET BY MOUTH DAILY   zolpidem  (AMBIEN ) 5 MG tablet TAKE 1 TABLET BY MOUTH AT BEDTIME AS NEEDED FOR SLEEP   No facility-administered medications prior to visit.   "

## 2024-02-19 ENCOUNTER — Ambulatory Visit: Payer: Self-pay | Admitting: Internal Medicine

## 2024-02-19 DIAGNOSIS — E039 Hypothyroidism, unspecified: Secondary | ICD-10-CM

## 2024-02-19 LAB — CBC WITH DIFFERENTIAL/PLATELET
Basophils Absolute: 0 x10E3/uL (ref 0.0–0.2)
Basos: 0 %
EOS (ABSOLUTE): 0 x10E3/uL (ref 0.0–0.4)
Eos: 1 %
Hematocrit: 42.1 % (ref 34.0–46.6)
Hemoglobin: 13.6 g/dL (ref 11.1–15.9)
Immature Grans (Abs): 0 x10E3/uL (ref 0.0–0.1)
Immature Granulocytes: 0 %
Lymphocytes Absolute: 1.1 x10E3/uL (ref 0.7–3.1)
Lymphs: 17 %
MCH: 29.4 pg (ref 26.6–33.0)
MCHC: 32.3 g/dL (ref 31.5–35.7)
MCV: 91 fL (ref 79–97)
Monocytes Absolute: 0.3 x10E3/uL (ref 0.1–0.9)
Monocytes: 5 %
Neutrophils Absolute: 4.9 x10E3/uL (ref 1.4–7.0)
Neutrophils: 77 %
Platelets: 234 x10E3/uL (ref 150–450)
RBC: 4.63 x10E6/uL (ref 3.77–5.28)
RDW: 14.3 % (ref 11.7–15.4)
WBC: 6.5 x10E3/uL (ref 3.4–10.8)

## 2024-02-19 LAB — CMP14+EGFR
ALT: 27 IU/L (ref 0–32)
AST: 29 IU/L (ref 0–40)
Albumin: 4.8 g/dL (ref 3.9–4.9)
Alkaline Phosphatase: 91 IU/L (ref 49–135)
BUN/Creatinine Ratio: 18 (ref 12–28)
BUN: 21 mg/dL (ref 8–27)
Bilirubin Total: 0.6 mg/dL (ref 0.0–1.2)
CO2: 21 mmol/L (ref 20–29)
Calcium: 10.6 mg/dL — ABNORMAL HIGH (ref 8.7–10.3)
Chloride: 97 mmol/L (ref 96–106)
Creatinine, Ser: 1.19 mg/dL — ABNORMAL HIGH (ref 0.57–1.00)
Globulin, Total: 2.5 g/dL (ref 1.5–4.5)
Glucose: 87 mg/dL (ref 70–99)
Potassium: 3.6 mmol/L (ref 3.5–5.2)
Sodium: 137 mmol/L (ref 134–144)
Total Protein: 7.3 g/dL (ref 6.0–8.5)
eGFR: 49 mL/min/1.73 — ABNORMAL LOW

## 2024-02-19 LAB — LIPID PANEL W/O CHOL/HDL RATIO
Cholesterol, Total: 186 mg/dL (ref 100–199)
HDL: 76 mg/dL
LDL Chol Calc (NIH): 91 mg/dL (ref 0–99)
Triglycerides: 106 mg/dL (ref 0–149)
VLDL Cholesterol Cal: 19 mg/dL (ref 5–40)

## 2024-02-19 LAB — TSH: TSH: 0.54 u[IU]/mL (ref 0.450–4.500)

## 2024-02-19 LAB — HEMOGLOBIN A1C
Est. average glucose Bld gHb Est-mCnc: 111 mg/dL
Hgb A1c MFr Bld: 5.5 % (ref 4.8–5.6)

## 2024-03-03 ENCOUNTER — Other Ambulatory Visit: Payer: Self-pay | Admitting: Internal Medicine

## 2024-03-03 DIAGNOSIS — R7303 Prediabetes: Secondary | ICD-10-CM

## 2024-03-19 ENCOUNTER — Other Ambulatory Visit: Payer: Self-pay | Admitting: Internal Medicine

## 2024-03-19 DIAGNOSIS — E039 Hypothyroidism, unspecified: Secondary | ICD-10-CM

## 2024-04-04 ENCOUNTER — Encounter

## 2024-05-05 ENCOUNTER — Ambulatory Visit: Admitting: Psychiatry

## 2024-05-19 ENCOUNTER — Ambulatory Visit: Admitting: Psychiatry

## 2024-06-03 ENCOUNTER — Ambulatory Visit: Admitting: Internal Medicine
